# Patient Record
Sex: Female | Born: 1955
Health system: Southern US, Community
[De-identification: ages and names within clinical notes are randomized; demographics above are authoritative.]

## PROBLEM LIST (undated history)

## (undated) DIAGNOSIS — E7800 Pure hypercholesterolemia, unspecified: Secondary | ICD-10-CM

## (undated) DIAGNOSIS — M797 Fibromyalgia: Secondary | ICD-10-CM

## (undated) DIAGNOSIS — I639 Cerebral infarction, unspecified: Secondary | ICD-10-CM

## (undated) DIAGNOSIS — I10 Essential (primary) hypertension: Secondary | ICD-10-CM

## (undated) DIAGNOSIS — C801 Malignant (primary) neoplasm, unspecified: Secondary | ICD-10-CM

## (undated) DIAGNOSIS — J45909 Unspecified asthma, uncomplicated: Secondary | ICD-10-CM

## (undated) HISTORY — PX: TUBAL LIGATION: SHX77

---

## 2009-10-26 ENCOUNTER — Ambulatory Visit (HOSPITAL_COMMUNITY): Admission: RE | Admit: 2009-10-26 | Discharge: 2009-10-26 | Payer: Self-pay | Admitting: Unknown Physician Specialty

## 2010-10-29 ENCOUNTER — Other Ambulatory Visit (HOSPITAL_COMMUNITY): Payer: Self-pay | Admitting: Unknown Physician Specialty

## 2010-10-29 DIAGNOSIS — Z1231 Encounter for screening mammogram for malignant neoplasm of breast: Secondary | ICD-10-CM

## 2010-11-07 ENCOUNTER — Ambulatory Visit (HOSPITAL_COMMUNITY)
Admission: RE | Admit: 2010-11-07 | Discharge: 2010-11-07 | Disposition: A | Discharge status: Home / Self Care | Payer: Self-pay | Source: Ambulatory Visit | Attending: Unknown Physician Specialty | Admitting: Unknown Physician Specialty

## 2010-11-07 DIAGNOSIS — Z1231 Encounter for screening mammogram for malignant neoplasm of breast: Secondary | ICD-10-CM | POA: Insufficient documentation

## 2011-09-27 ENCOUNTER — Other Ambulatory Visit (HOSPITAL_BASED_OUTPATIENT_CLINIC_OR_DEPARTMENT_OTHER): Payer: Self-pay | Admitting: Unknown Physician Specialty

## 2011-09-27 DIAGNOSIS — Z1231 Encounter for screening mammogram for malignant neoplasm of breast: Secondary | ICD-10-CM

## 2011-11-08 ENCOUNTER — Encounter (HOSPITAL_BASED_OUTPATIENT_CLINIC_OR_DEPARTMENT_OTHER): Payer: Self-pay

## 2011-11-08 ENCOUNTER — Emergency Department (HOSPITAL_BASED_OUTPATIENT_CLINIC_OR_DEPARTMENT_OTHER)
Admission: EM | Admit: 2011-11-08 | Discharge: 2011-11-08 | Disposition: A | Discharge status: Home / Self Care | Payer: Self-pay | Attending: Emergency Medicine | Admitting: Emergency Medicine

## 2011-11-08 ENCOUNTER — Ambulatory Visit (HOSPITAL_BASED_OUTPATIENT_CLINIC_OR_DEPARTMENT_OTHER)
Admission: RE | Admit: 2011-11-08 | Discharge: 2011-11-08 | Disposition: A | Discharge status: Home / Self Care | Payer: Self-pay | Source: Ambulatory Visit | Attending: Unknown Physician Specialty | Admitting: Unknown Physician Specialty

## 2011-11-08 DIAGNOSIS — H6980 Other specified disorders of Eustachian tube, unspecified ear: Secondary | ICD-10-CM

## 2011-11-08 DIAGNOSIS — I10 Essential (primary) hypertension: Secondary | ICD-10-CM | POA: Insufficient documentation

## 2011-11-08 DIAGNOSIS — E7800 Pure hypercholesterolemia, unspecified: Secondary | ICD-10-CM | POA: Insufficient documentation

## 2011-11-08 DIAGNOSIS — Z87891 Personal history of nicotine dependence: Secondary | ICD-10-CM | POA: Insufficient documentation

## 2011-11-08 DIAGNOSIS — H9209 Otalgia, unspecified ear: Secondary | ICD-10-CM | POA: Insufficient documentation

## 2011-11-08 DIAGNOSIS — Z1231 Encounter for screening mammogram for malignant neoplasm of breast: Secondary | ICD-10-CM | POA: Insufficient documentation

## 2011-11-08 DIAGNOSIS — E119 Type 2 diabetes mellitus without complications: Secondary | ICD-10-CM | POA: Insufficient documentation

## 2011-11-08 HISTORY — DX: Essential (primary) hypertension: I10

## 2011-11-08 HISTORY — DX: Pure hypercholesterolemia, unspecified: E78.00

## 2011-11-08 HISTORY — DX: Fibromyalgia: M79.7

## 2011-11-08 HISTORY — DX: Unspecified asthma, uncomplicated: J45.909

## 2011-11-08 MED ORDER — GUAIFENESIN ER 1200 MG PO TB12: 1.0000 | ORAL_TABLET | Freq: Two times a day (BID) | ORAL | Status: DC

## 2011-11-08 MED ORDER — BUDESONIDE 32 MCG/ACT NA SUSP: 2.0000 | Freq: Every day | NASAL | Status: DC

## 2011-11-08 NOTE — ED Provider Notes (Signed)
Medical screening examination/treatment/procedure(s) were performed by non-physician practitioner and as supervising physician I was immediately available for consultation/collaboration.   Caytlyn Evers, MD 11/08/11 1820 

## 2011-11-08 NOTE — ED Provider Notes (Signed)
History     CSN: [id number]  Arrival date & time 11/08/11  1228   First MD Initiated Contact with Patient 11/08/11 1242      Chief Complaint  Patient presents with  . Otalgia    (Consider location/radiation/quality/duration/timing/severity/associated sxs/prior treatment) HPI The patient presents with bilateral ear pain for the last 2 weeks. The patient states that she feels like there is fluid in her ears. The patient is hearing popping and crackling in her ears. The patient states she has not tried any treatment for this issue. The patient states that she was not sure if something flew into her ear or what the issue was. The patient denies cough, fever, sore throat, nausea, vomiting, or diarrhea. The patient states that at night she feels like there is a wooshing sound in her ears. Past Medical History  Diagnosis Date  . Hypertension   . Diabetes mellitus   . High cholesterol   . Asthma   . Fibromyalgia     Past Surgical History  Procedure Date  . Tubal ligation     No family history on file.  History  Substance Use Topics  . Smoking status: Former Games developer  . Smokeless tobacco: Not on file  . Alcohol Use: Yes    OB History    Grav Para Term Preterm Abortions TAB SAB Ect Mult Living                  Review of Systems All other systems negative except as documented in the HPI. All pertinent positives and negatives as reviewed in the HPI.  Allergies  Review of patient's allergies indicates no known allergies.  Home Medications  No current outpatient prescriptions on file.  BP 167/80  Pulse 77  Temp 98.6 F (37 C) (Oral)  Resp 20  Ht 5\' 2"  (1.575 m)  Wt 197 lb (89.359 kg)  BMI 36.03 kg/m2  SpO2 100%  LMP 10/30/2011  Physical Exam  Constitutional: She appears well-developed and well-nourished.  HENT:  Head: Normocephalic and atraumatic.  Right Ear: No drainage or tenderness. Tympanic membrane is not erythematous and not bulging. A middle ear effusion  is present.  Left Ear: No drainage or tenderness. Tympanic membrane is not erythematous and not bulging. A middle ear effusion is present.  Eyes: EOM are normal. Pupils are equal, round, and reactive to light.  Neck: Normal range of motion. Neck supple.  Cardiovascular: Normal rate and regular rhythm.   Pulmonary/Chest: Effort normal and breath sounds normal.  Skin: Skin is warm and dry. No rash noted.    ED Course  Procedures (including critical care time)  The patient most likely has eustachian tube dysfunction. She is referred back to her PCP and to ENT as needed. Told to increase her fluids.    MDM         Carlyle Dolly, PA-C 11/08/11 1258

## 2011-11-08 NOTE — Discharge Instructions (Signed)
Return here as needed. Increase your fluids. Follow up with your doctor for a recheck and with the ENT doctor as needed.

## 2011-11-08 NOTE — ED Notes (Signed)
bilat earache x 2 weeks

## 2012-06-28 ENCOUNTER — Emergency Department (HOSPITAL_BASED_OUTPATIENT_CLINIC_OR_DEPARTMENT_OTHER)
Admission: EM | Admit: 2012-06-28 | Discharge: 2012-06-28 | Disposition: A | Discharge status: Home / Self Care | Payer: Self-pay | Attending: Emergency Medicine | Admitting: Emergency Medicine

## 2012-06-28 ENCOUNTER — Encounter (HOSPITAL_BASED_OUTPATIENT_CLINIC_OR_DEPARTMENT_OTHER): Payer: Self-pay | Admitting: *Deleted

## 2012-06-28 DIAGNOSIS — IMO0001 Reserved for inherently not codable concepts without codable children: Secondary | ICD-10-CM | POA: Insufficient documentation

## 2012-06-28 DIAGNOSIS — Z862 Personal history of diseases of the blood and blood-forming organs and certain disorders involving the immune mechanism: Secondary | ICD-10-CM | POA: Insufficient documentation

## 2012-06-28 DIAGNOSIS — M791 Myalgia, unspecified site: Secondary | ICD-10-CM

## 2012-06-28 DIAGNOSIS — R05 Cough: Secondary | ICD-10-CM | POA: Insufficient documentation

## 2012-06-28 DIAGNOSIS — R059 Cough, unspecified: Secondary | ICD-10-CM | POA: Insufficient documentation

## 2012-06-28 DIAGNOSIS — E119 Type 2 diabetes mellitus without complications: Secondary | ICD-10-CM | POA: Insufficient documentation

## 2012-06-28 DIAGNOSIS — Z8639 Personal history of other endocrine, nutritional and metabolic disease: Secondary | ICD-10-CM | POA: Insufficient documentation

## 2012-06-28 DIAGNOSIS — J45901 Unspecified asthma with (acute) exacerbation: Secondary | ICD-10-CM | POA: Insufficient documentation

## 2012-06-28 DIAGNOSIS — J3489 Other specified disorders of nose and nasal sinuses: Secondary | ICD-10-CM | POA: Insufficient documentation

## 2012-06-28 DIAGNOSIS — R51 Headache: Secondary | ICD-10-CM

## 2012-06-28 DIAGNOSIS — Z87891 Personal history of nicotine dependence: Secondary | ICD-10-CM | POA: Insufficient documentation

## 2012-06-28 DIAGNOSIS — R110 Nausea: Secondary | ICD-10-CM | POA: Insufficient documentation

## 2012-06-28 DIAGNOSIS — I10 Essential (primary) hypertension: Secondary | ICD-10-CM | POA: Insufficient documentation

## 2012-06-28 MED ORDER — IBUPROFEN 600 MG PO TABS: 600.0000 mg | ORAL_TABLET | Freq: Four times a day (QID) | ORAL | Status: DC | PRN

## 2012-06-28 MED ORDER — DIPHENHYDRAMINE HCL 25 MG PO CAPS
50.0000 mg | ORAL_CAPSULE | Freq: Once | ORAL | Status: AC
  Administered 2012-06-28: 50 mg via ORAL
  Filled 2012-06-28: qty 2

## 2012-06-28 MED ORDER — KETOROLAC TROMETHAMINE 30 MG/ML IJ SOLN
30.0000 mg | Freq: Once | INTRAMUSCULAR | Status: AC
  Administered 2012-06-28: 30 mg via INTRAMUSCULAR
  Filled 2012-06-28: qty 1

## 2012-06-28 MED ORDER — METOCLOPRAMIDE HCL 5 MG/ML IJ SOLN
10.0000 mg | Freq: Once | INTRAMUSCULAR | Status: AC
  Administered 2012-06-28: 10 mg via INTRAMUSCULAR
  Filled 2012-06-28: qty 2

## 2012-06-28 NOTE — ED Notes (Addendum)
Pt describes H/A, back, chest pain with deep inspiration, productive cough with light yellow sputum x 1 week. Here with family member also being seen.

## 2012-06-28 NOTE — ED Provider Notes (Signed)
History  This chart was scribed for Krystal Chick, MD by Shari Heritage, ED Scribe. The patient was seen in room MH05/MH05. Patient's care was started at 2153.   CSN: [id number]  Arrival date & time 06/28/12  1825   First MD Initiated Contact with Patient 06/28/12 2153      Chief Complaint  Patient presents with  . Headache     Patient is a 57 y.o. female presenting with headaches.  Headache Pain location:  R temporal and frontal Quality:  Dull Radiates to:  Does not radiate Duration:  2 weeks Timing:  Constant Relieved by:  Aspirin (transiently) Associated symptoms: cough, myalgias and nausea   Associated symptoms: no fever   Cough:    Cough characteristics:  Productive   Sputum characteristics:  Clear   Severity:  Mild   Duration:  1 week   Timing:  Intermittent Myalgias:    Location:  Generalized   Severity:  Moderate   Duration:  1 week   HPI Comments: Krystal Vargas is a 57 y.o. female with history of asthma, HTN, diabetes and high cholesterol who presents to the Emergency Department complaining of constant, non-radiating, right temporal and frontal headache onset 2 weeks ago. There is associated body aches, mild intermittent cough, wheezing and nausea. She states that body aches, cough and wheezing began 1 week ago and nausea began earlier today. Cough is productive of clear sputum. She states that she has used her inhaler with no relief. She has been taking aspirin for the headache which she says relieves pain transiently. Patient denies fever. She states that she hasn't checked her CGB in a few days, but she states that glucose is usually well controlled.   Past Medical History  Diagnosis Date  . Hypertension   . Diabetes mellitus   . High cholesterol   . Asthma   . Fibromyalgia     Past Surgical History  Procedure Laterality Date  . Tubal ligation      History reviewed. No pertinent family history.  History  Substance Use Topics  . Smoking status: Former  Games developer  . Smokeless tobacco: Not on file  . Alcohol Use: Yes    OB History   Grav Para Term Preterm Abortions TAB SAB Ect Mult Living                  Review of Systems  Constitutional: Negative for fever.  Respiratory: Positive for cough and wheezing.   Gastrointestinal: Positive for nausea.  Musculoskeletal: Positive for myalgias.  Neurological: Positive for headaches.  All other systems reviewed and are negative.    Allergies  Review of patient's allergies indicates no known allergies.  Home Medications   Current Outpatient Rx  Name  Route  Sig  Dispense  Refill  . budesonide (RHINOCORT AQUA) 32 MCG/ACT nasal spray   Nasal   Place 2 sprays into the nose daily.   8.6 g   0   . Guaifenesin 1200 MG TB12   Oral   Take 1 tablet (1,200 mg total) by mouth 2 (two) times daily.   20 each   0   . ibuprofen (ADVIL,MOTRIN) 600 MG tablet   Oral   Take 1 tablet (600 mg total) by mouth every 6 (six) hours as needed for pain.   30 tablet   0   . UNKNOWN TO PATIENT      Pt does not know meds and did not bring to ED  Triage Vitals: BP 172/86  Pulse 77  Temp(Src) 99 F (37.2 C) (Oral)  Resp 18  Ht 5\' 2"  (1.575 m)  Wt 204 lb (92.534 kg)  BMI 37.3 kg/m2  SpO2 96%  LMP 05/16/2012  Physical Exam  Nursing note and vitals reviewed. Constitutional: She is oriented to person, place, and time. She appears well-developed and well-nourished.  HENT:  Head: Normocephalic and atraumatic.  Mouth/Throat: Oropharynx is clear and moist and mucous membranes are normal. Mucous membranes are not dry.  Eyes: Conjunctivae and EOM are normal. Pupils are equal, round, and reactive to light.  Neck: Normal range of motion. Neck supple.  Cardiovascular: Normal rate, regular rhythm and normal heart sounds.   Pulmonary/Chest: Effort normal and breath sounds normal. No respiratory distress. She has no wheezes. She has no rales.  No increased respiratory effort.  Abdominal: Soft.  Bowel sounds are normal.  Musculoskeletal: Normal range of motion.  Neurological: She is alert and oriented to person, place, and time.  Skin: Skin is warm and dry.  Psychiatric: She has a normal mood and affect.    ED Course  Procedures (including critical care time) DIAGNOSTIC STUDIES: Oxygen Saturation is 96% on room air, adequate by my interpretation.    COORDINATION OF CARE: 10:26 PM- Patient informed of current plan for treatment and evaluation and agrees with plan at this time.    Labs Reviewed  GLUCOSE, CAPILLARY    No results found.   1. Headache   2. Myalgia       MDM  Pt presents with c/o headache, nasal congestion and body aches.  She overall appears well hydrated and nontoxic.  No wheezing or abnormal lung sounds on exam.  Blood glucose is well controlled. She was given meds for headache.  Symptoms sound mostly viral in nature.  Discharged with strict return precautions.  Pt agreeable with plan.    I personally performed the services described in this documentation, which was scribed in my presence. The recorded information has been reviewed and is accurate.    Krystal Chick, MD 07/01/12 1536

## 2012-06-28 NOTE — ED Notes (Signed)
I took cbg reading and got 33 mg/dcltr.

## 2014-01-07 ENCOUNTER — Encounter (HOSPITAL_BASED_OUTPATIENT_CLINIC_OR_DEPARTMENT_OTHER): Payer: Self-pay | Admitting: Emergency Medicine

## 2014-01-07 ENCOUNTER — Emergency Department (HOSPITAL_BASED_OUTPATIENT_CLINIC_OR_DEPARTMENT_OTHER): Payer: Self-pay

## 2014-01-07 ENCOUNTER — Emergency Department (HOSPITAL_BASED_OUTPATIENT_CLINIC_OR_DEPARTMENT_OTHER)
Admission: EM | Admit: 2014-01-07 | Discharge: 2014-01-07 | Disposition: A | Discharge status: Home / Self Care | Payer: Self-pay | Attending: Emergency Medicine | Admitting: Emergency Medicine

## 2014-01-07 DIAGNOSIS — M25476 Effusion, unspecified foot: Principal | ICD-10-CM | POA: Insufficient documentation

## 2014-01-07 DIAGNOSIS — M25472 Effusion, left ankle: Secondary | ICD-10-CM

## 2014-01-07 DIAGNOSIS — Z87891 Personal history of nicotine dependence: Secondary | ICD-10-CM | POA: Insufficient documentation

## 2014-01-07 DIAGNOSIS — E7800 Pure hypercholesterolemia, unspecified: Secondary | ICD-10-CM | POA: Insufficient documentation

## 2014-01-07 DIAGNOSIS — E119 Type 2 diabetes mellitus without complications: Secondary | ICD-10-CM | POA: Insufficient documentation

## 2014-01-07 DIAGNOSIS — M25579 Pain in unspecified ankle and joints of unspecified foot: Secondary | ICD-10-CM | POA: Insufficient documentation

## 2014-01-07 DIAGNOSIS — I10 Essential (primary) hypertension: Secondary | ICD-10-CM | POA: Insufficient documentation

## 2014-01-07 DIAGNOSIS — M25473 Effusion, unspecified ankle: Secondary | ICD-10-CM | POA: Insufficient documentation

## 2014-01-07 DIAGNOSIS — J45909 Unspecified asthma, uncomplicated: Secondary | ICD-10-CM | POA: Insufficient documentation

## 2014-01-07 DIAGNOSIS — IMO0002 Reserved for concepts with insufficient information to code with codable children: Secondary | ICD-10-CM | POA: Insufficient documentation

## 2014-01-07 MED ORDER — HYDROCODONE-ACETAMINOPHEN 5-325 MG PO TABS: 2.0000 | ORAL_TABLET | ORAL | Status: DC | PRN

## 2014-01-07 MED ORDER — IBUPROFEN 800 MG PO TABS: 800.0000 mg | ORAL_TABLET | Freq: Three times a day (TID) | ORAL | Status: DC

## 2014-01-07 NOTE — Discharge Instructions (Signed)

## 2014-01-07 NOTE — ED Provider Notes (Signed)
Medical screening examination/treatment/procedure(s) were performed by non-physician practitioner and as supervising physician I was immediately available for consultation/collaboration.   EKG Interpretation None        Evelina Bucy, MD 01/07/14 2250

## 2014-01-07 NOTE — ED Provider Notes (Signed)
CSN: [id number]     Arrival date & time 01/07/14  1425 History   First MD Initiated Contact with Patient 01/07/14 1547     Chief Complaint  Patient presents with  . Ankle Pain     (Consider location/radiation/quality/duration/timing/severity/associated sxs/prior Treatment) Patient is a 58 y.o. female presenting with ankle pain. The history is provided by the patient. No language interpreter was used.  Ankle Pain Location:  Ankle Injury: no   Ankle location:  L ankle Pain details:    Quality:  Aching   Radiates to:  Does not radiate   Severity:  Moderate   Onset quality:  Gradual   Timing:  Constant   Progression:  Worsening Chronicity:  New Dislocation: no   Foreign body present:  No foreign bodies Tetanus status:  Out of date Prior injury to area:  Yes Relieved by:  Nothing Worsened by:  Nothing tried Ineffective treatments:  None tried Associated symptoms: swelling   Risk factors: no concern for non-accidental trauma     Past Medical History  Diagnosis Date  . Hypertension   . Diabetes mellitus   . High cholesterol   . Asthma   . Fibromyalgia    Past Surgical History  Procedure Laterality Date  . Tubal ligation     No family history on file. History  Substance Use Topics  . Smoking status: Former Research scientist (life sciences)  . Smokeless tobacco: Not on file  . Alcohol Use: Yes   OB History   Grav Para Term Preterm Abortions TAB SAB Ect Mult Living                 Review of Systems  Musculoskeletal: Positive for joint swelling.  All other systems reviewed and are negative.     Allergies  Review of patient's allergies indicates no known allergies.  Home Medications   Prior to Admission medications   Medication Sig Start Date End Date Taking? Authorizing Provider  HYDROCHLOROTHIAZIDE PO Take by mouth.   Yes Historical Provider, MD  Rosuvastatin Calcium (CRESTOR PO) Take by mouth.   Yes Historical Provider, MD  budesonide (RHINOCORT AQUA) 32 MCG/ACT nasal spray  Place 2 sprays into the nose daily. 11/08/11 11/07/12  Resa Miner Lawyer, PA-C  Guaifenesin 1200 MG TB12 Take 1 tablet (1,200 mg total) by mouth 2 (two) times daily. 11/08/11   Resa Miner Lawyer, PA-C  ibuprofen (ADVIL,MOTRIN) 600 MG tablet Take 1 tablet (600 mg total) by mouth every 6 (six) hours as needed for pain. 06/28/12   Threasa Beards, MD  UNKNOWN TO PATIENT Pt does not know meds and did not bring to ED    Historical Provider, MD   BP 155/85  Pulse 63  Temp(Src) 98.3 F (36.8 C) (Oral)  Resp 16  Ht 5\' 1"  (1.549 m)  Wt 204 lb (92.534 kg)  BMI 38.57 kg/m2  SpO2 99%  LMP 05/16/2012 Physical Exam  Nursing note and vitals reviewed. Constitutional: She appears well-developed and well-nourished.  Musculoskeletal: She exhibits tenderness.  Swollen ankle, from,  nv and ns intact   Neurological: She is alert. She has normal reflexes.  Skin: Skin is warm.  Psychiatric: She has a normal mood and affect.    ED Course  Procedures (including critical care time) Labs Review Labs Reviewed - No data to display  Imaging Review Dg Ankle Complete Left  01/07/2014   CLINICAL DATA:  Ankle pain .  EXAM: LEFT ANKLE COMPLETE - 3+ VIEW  COMPARISON:  None.  FINDINGS: Soft tissue swelling  is noted. No evidence of fracture or dislocation. No acute bony abnormality .  IMPRESSION: Soft tissue swelling.  No acute bony abnormality .   Electronically Signed   By: Marcello Moores  Register   On: 01/07/2014 14:58     EKG Interpretation None      MDM   Final diagnoses:  Ankle swelling, left    Post op shoe Ace Hydrocodone Ibuprofen    Fransico Meadow, PA-C 01/07/14 1658

## 2014-01-07 NOTE — ED Notes (Signed)
Pain in her left ankle x 2 weeks. No known injury.

## 2014-04-19 ENCOUNTER — Emergency Department (HOSPITAL_BASED_OUTPATIENT_CLINIC_OR_DEPARTMENT_OTHER)
Admission: EM | Admit: 2014-04-19 | Discharge: 2014-04-19 | Disposition: A | Discharge status: Home / Self Care | Payer: Self-pay | Attending: Emergency Medicine | Admitting: Emergency Medicine

## 2014-04-19 ENCOUNTER — Encounter (HOSPITAL_BASED_OUTPATIENT_CLINIC_OR_DEPARTMENT_OTHER): Payer: Self-pay

## 2014-04-19 DIAGNOSIS — M797 Fibromyalgia: Secondary | ICD-10-CM | POA: Insufficient documentation

## 2014-04-19 DIAGNOSIS — Z791 Long term (current) use of non-steroidal anti-inflammatories (NSAID): Secondary | ICD-10-CM | POA: Insufficient documentation

## 2014-04-19 DIAGNOSIS — M25511 Pain in right shoulder: Secondary | ICD-10-CM | POA: Insufficient documentation

## 2014-04-19 DIAGNOSIS — E780 Pure hypercholesterolemia: Secondary | ICD-10-CM | POA: Insufficient documentation

## 2014-04-19 DIAGNOSIS — M25531 Pain in right wrist: Secondary | ICD-10-CM | POA: Insufficient documentation

## 2014-04-19 DIAGNOSIS — Z79899 Other long term (current) drug therapy: Secondary | ICD-10-CM | POA: Insufficient documentation

## 2014-04-19 DIAGNOSIS — J45909 Unspecified asthma, uncomplicated: Secondary | ICD-10-CM | POA: Insufficient documentation

## 2014-04-19 DIAGNOSIS — M2550 Pain in unspecified joint: Secondary | ICD-10-CM

## 2014-04-19 DIAGNOSIS — M25512 Pain in left shoulder: Secondary | ICD-10-CM | POA: Insufficient documentation

## 2014-04-19 DIAGNOSIS — Z87891 Personal history of nicotine dependence: Secondary | ICD-10-CM | POA: Insufficient documentation

## 2014-04-19 DIAGNOSIS — M25532 Pain in left wrist: Secondary | ICD-10-CM | POA: Insufficient documentation

## 2014-04-19 DIAGNOSIS — E119 Type 2 diabetes mellitus without complications: Secondary | ICD-10-CM | POA: Insufficient documentation

## 2014-04-19 DIAGNOSIS — M25571 Pain in right ankle and joints of right foot: Secondary | ICD-10-CM | POA: Insufficient documentation

## 2014-04-19 DIAGNOSIS — M25561 Pain in right knee: Secondary | ICD-10-CM | POA: Insufficient documentation

## 2014-04-19 DIAGNOSIS — M25572 Pain in left ankle and joints of left foot: Secondary | ICD-10-CM | POA: Insufficient documentation

## 2014-04-19 DIAGNOSIS — I10 Essential (primary) hypertension: Secondary | ICD-10-CM | POA: Insufficient documentation

## 2014-04-19 DIAGNOSIS — M25562 Pain in left knee: Secondary | ICD-10-CM | POA: Insufficient documentation

## 2014-04-19 MED ORDER — TRAMADOL HCL 50 MG PO TABS: 50.0000 mg | ORAL_TABLET | Freq: Four times a day (QID) | ORAL | Status: DC | PRN

## 2014-04-19 NOTE — ED Notes (Signed)
Pt reports pain all over. Sts pain worse on left. Sts "they told me they couldn't do anything about it." Sts "I have fibromyalgia and arthritis."

## 2014-04-19 NOTE — Discharge Instructions (Signed)
Take pain medication as needed for pain.  Do not drive or operate heavy machinery for 4-6 hours after taking pain medication. Arthralgia Your caregiver has diagnosed you as suffering from an arthralgia. Arthralgia means there is pain in a joint. This can come from many reasons including:  Bruising the joint which causes soreness (inflammation) in the joint.  Wear and tear on the joints which occur as we grow older (osteoarthritis).  Overusing the joint.  Various forms of arthritis.  Infections of the joint. Regardless of the cause of pain in your joint, most of these different pains respond to anti-inflammatory drugs and rest. The exception to this is when a joint is infected, and these cases are treated with antibiotics, if it is a bacterial infection. HOME CARE INSTRUCTIONS   Rest the injured area for as long as directed by your caregiver. Then slowly start using the joint as directed by your caregiver and as the pain allows. Crutches as directed may be useful if the ankles, knees or hips are involved. If the knee was splinted or casted, continue use and care as directed. If an stretchy or elastic wrapping bandage has been applied today, it should be removed and re-applied every 3 to 4 hours. It should not be applied tightly, but firmly enough to keep swelling down. Watch toes and feet for swelling, bluish discoloration, coldness, numbness or excessive pain. If any of these problems (symptoms) occur, remove the ace bandage and re-apply more loosely. If these symptoms persist, contact your caregiver or return to this location.  For the first 24 hours, keep the injured extremity elevated on pillows while lying down.  Apply ice for 15-20 minutes to the sore joint every couple hours while awake for the first half day. Then 03-04 times per day for the first 48 hours. Put the ice in a plastic bag and place a towel between the bag of ice and your skin.  Wear any splinting, casting, elastic bandage  applications, or slings as instructed.  Only take over-the-counter or prescription medicines for pain, discomfort, or fever as directed by your caregiver. Do not use aspirin immediately after the injury unless instructed by your physician. Aspirin can cause increased bleeding and bruising of the tissues.  If you were given crutches, continue to use them as instructed and do not resume weight bearing on the sore joint until instructed. Persistent pain and inability to use the sore joint as directed for more than 2 to 3 days are warning signs indicating that you should see a caregiver for a follow-up visit as soon as possible. Initially, a hairline fracture (break in bone) may not be evident on X-rays. Persistent pain and swelling indicate that further evaluation, non-weight bearing or use of the joint (use of crutches or slings as instructed), or further X-rays are indicated. X-rays may sometimes not show a small fracture until a week or 10 days later. Make a follow-up appointment with your own caregiver or one to whom we have referred you. A radiologist (specialist in reading X-rays) may read your X-rays. Make sure you know how you are to obtain your X-ray results. Do not assume everything is normal if you do not hear from [place]. SEEK MEDICAL CARE IF: Bruising, swelling, or pain increases. SEEK IMMEDIATE MEDICAL CARE IF:   Your fingers or toes are numb or blue.  The pain is not responding to medications and continues to stay the same or get worse.  The pain in your joint becomes severe.  You  develop a fever over 102 F (38.9 C).  It becomes impossible to move or use the joint. MAKE SURE YOU:   Understand these instructions.  Will watch your condition.  Will get help right away if you are not doing well or get worse. Document Released: 04/29/2005 Document Revised: 07/22/2011 Document Reviewed: 12/16/2007 [facility] Patient Information 2015 [place], [place]. This information is not intended to  replace advice given to you by your health care provider. Make sure you discuss any questions you have with your health care provider.

## 2014-04-19 NOTE — ED Provider Notes (Signed)
CSN: [id number]     Arrival date & time 04/19/14  1803 History  This chart was scribed for Hyman Bible, PA-C with Slutsky Morn, MD by Edison Simon, ED Scribe. This patient was seen in room MH10/MH10 and the patient's care was started at 6:51 PM.    Chief Complaint  Patient presents with  . Generalized Body Aches   The history is provided by the patient. No language interpreter was used.   HPI Comments: Krystal Vargas is a 58 y.o. female with history of arthritis and fibromyalgia who presents to the Emergency Department complaining of "pain all over" since 11/17. She states it occurs every day and is worse at night. She locates it to her bilateral hands shooting to her shoulder, and feet,  and knees when walking, worse on the left. She reports some associated pain of her wrists and ankles.  She denies fever or chills. She denies acute injury or trauma.  She states she was evaluated by her PCP who tested her for rheumatoid arthritis, but has not gotten results back yet. She states she has not had a fibromyalgia flare up in some time and is unsure if this pain is similar but states it might be. She was prescribed Carbamazepine for fibromyalgia previously which she used to use every day; she states she can get more filled from prior prescription. She states she has been using ASA for her symptoms without relief.  Past Medical History  Diagnosis Date  . Hypertension   . Diabetes mellitus   . High cholesterol   . Asthma   . Fibromyalgia    Past Surgical History  Procedure Laterality Date  . Tubal ligation     No family history on file. History  Substance Use Topics  . Smoking status: Former Research scientist (life sciences)  . Smokeless tobacco: Not on file  . Alcohol Use: Yes   OB History    No data available     Review of Systems  Constitutional: Negative for fever.  Musculoskeletal: Positive for myalgias and arthralgias.  All other systems reviewed and are negative.     Allergies  Review of patient's  allergies indicates no known allergies.  Home Medications   Prior to Admission medications   Medication Sig Start Date End Date Taking? Authorizing Provider  budesonide (RHINOCORT AQUA) 32 MCG/ACT nasal spray Place 2 sprays into the nose daily. 11/08/11 11/07/12  Resa Miner Lawyer, PA-C  Guaifenesin 1200 MG TB12 Take 1 tablet (1,200 mg total) by mouth 2 (two) times daily. 11/08/11   Resa Miner Lawyer, PA-C  HYDROCHLOROTHIAZIDE PO Take by mouth.    Historical Provider, MD  HYDROcodone-acetaminophen (NORCO/VICODIN) 5-325 MG per tablet Take 2 tablets by mouth every 4 (four) hours as needed. 01/07/14   Fransico Meadow, PA-C  ibuprofen (ADVIL,MOTRIN) 600 MG tablet Take 1 tablet (600 mg total) by mouth every 6 (six) hours as needed for pain. 06/28/12   Threasa Beards, MD  ibuprofen (ADVIL,MOTRIN) 800 MG tablet Take 1 tablet (800 mg total) by mouth 3 (three) times daily. 01/07/14   Fransico Meadow, PA-C  Rosuvastatin Calcium (CRESTOR PO) Take by mouth.    Historical Provider, MD  UNKNOWN TO PATIENT Pt does not know meds and did not bring to ED    Historical Provider, MD   BP 158/83 mmHg  Pulse 84  Temp(Src) 99.2 F (37.3 C) (Oral)  Resp 18  SpO2 96%  LMP 05/16/2012 Physical Exam  Constitutional: She is oriented to person, place, and time.  She appears well-developed and well-nourished.  HENT:  Head: Normocephalic and atraumatic.  Eyes: Conjunctivae are normal.  Neck: Normal range of motion. Neck supple.  Cardiovascular: Normal rate, regular rhythm and normal heart sounds.   No murmur heard. Pulses:      Radial pulses are 2+ on the right side, and 2+ on the left side.       Dorsalis pedis pulses are 2+ on the right side, and 2+ on the left side.  Pulmonary/Chest: Effort normal and breath sounds normal. No respiratory distress. She has no wheezes. She has no rales.  Musculoskeletal: Normal range of motion. She exhibits no edema or tenderness.  Full ROM of ankles, knees, and hips bilaterally.  No erythema, edema, or warmth Full ROM of wrist, ankles, shoulder, and hands bilaterally. no erythema, edema, or warmth  Neurological: She is alert and oriented to person, place, and time.  Skin: Skin is warm and dry.  Psychiatric: She has a normal mood and affect.  Nursing note and vitals reviewed.   ED Course  Procedures (including critical care time)  DIAGNOSTIC STUDIES: Oxygen Saturation is 96% on room air, normal by my interpretation.    COORDINATION OF CARE: 6:59 PM Discussed with patient that since she has had workup from her PCP and she does not have any signs of infection or new acute problems, further workup here is unlikely to be helpful; will treat with pain medication. The patient agrees with the plan and has no further questions at this time.   Labs Review Labs Reviewed - No data to display  Imaging Review No results found.   EKG Interpretation None      MDM   Final diagnoses:  None   Patient with a history of Fibromyalgia presents today with diffuse joint pain.  Patient afebrile.  No signs of infection at this time.  Full ROM of all joints.  She reports that the pain is similar to pain that she has had in the past with a Fibromyalgia flare up.  Patient stable for discharge.  Patient discharged home with pain medication.  Return precautions given.   Hyman Bible, PA-C 04/19/14 [place], MD 04/19/14 1933

## 2014-04-19 NOTE — ED Notes (Signed)
Pt states she started having increased joint pain, shoulders, knees, hands, feet and pelvis since returning home from a trip to [facility] Nov 17th.  Denies injury.  Pt states she has seen her pcp for same since, and was tested for RA.  Pt states she doesn't know the results as of yet.

## 2014-08-17 ENCOUNTER — Encounter (HOSPITAL_BASED_OUTPATIENT_CLINIC_OR_DEPARTMENT_OTHER): Payer: Self-pay | Admitting: Emergency Medicine

## 2014-08-17 ENCOUNTER — Emergency Department (HOSPITAL_BASED_OUTPATIENT_CLINIC_OR_DEPARTMENT_OTHER)
Admission: EM | Admit: 2014-08-17 | Discharge: 2014-08-17 | Disposition: A | Discharge status: Home / Self Care | Payer: Medicare Other | Attending: Emergency Medicine | Admitting: Emergency Medicine

## 2014-08-17 ENCOUNTER — Emergency Department (HOSPITAL_BASED_OUTPATIENT_CLINIC_OR_DEPARTMENT_OTHER): Payer: Medicare Other

## 2014-08-17 DIAGNOSIS — E876 Hypokalemia: Secondary | ICD-10-CM | POA: Insufficient documentation

## 2014-08-17 DIAGNOSIS — R110 Nausea: Secondary | ICD-10-CM | POA: Diagnosis not present

## 2014-08-17 DIAGNOSIS — Z7982 Long term (current) use of aspirin: Secondary | ICD-10-CM | POA: Diagnosis not present

## 2014-08-17 DIAGNOSIS — J029 Acute pharyngitis, unspecified: Secondary | ICD-10-CM | POA: Insufficient documentation

## 2014-08-17 DIAGNOSIS — E780 Pure hypercholesterolemia: Secondary | ICD-10-CM | POA: Insufficient documentation

## 2014-08-17 DIAGNOSIS — R05 Cough: Secondary | ICD-10-CM | POA: Diagnosis not present

## 2014-08-17 DIAGNOSIS — R509 Fever, unspecified: Secondary | ICD-10-CM | POA: Insufficient documentation

## 2014-08-17 DIAGNOSIS — R5383 Other fatigue: Secondary | ICD-10-CM | POA: Diagnosis not present

## 2014-08-17 DIAGNOSIS — R0981 Nasal congestion: Secondary | ICD-10-CM | POA: Insufficient documentation

## 2014-08-17 DIAGNOSIS — M797 Fibromyalgia: Secondary | ICD-10-CM | POA: Diagnosis not present

## 2014-08-17 DIAGNOSIS — Z87891 Personal history of nicotine dependence: Secondary | ICD-10-CM | POA: Insufficient documentation

## 2014-08-17 DIAGNOSIS — E119 Type 2 diabetes mellitus without complications: Secondary | ICD-10-CM | POA: Insufficient documentation

## 2014-08-17 DIAGNOSIS — R6889 Other general symptoms and signs: Secondary | ICD-10-CM

## 2014-08-17 DIAGNOSIS — Z79899 Other long term (current) drug therapy: Secondary | ICD-10-CM | POA: Insufficient documentation

## 2014-08-17 DIAGNOSIS — I10 Essential (primary) hypertension: Secondary | ICD-10-CM | POA: Insufficient documentation

## 2014-08-17 DIAGNOSIS — H9203 Otalgia, bilateral: Secondary | ICD-10-CM | POA: Insufficient documentation

## 2014-08-17 DIAGNOSIS — R059 Cough, unspecified: Secondary | ICD-10-CM

## 2014-08-17 DIAGNOSIS — J3489 Other specified disorders of nose and nasal sinuses: Secondary | ICD-10-CM | POA: Insufficient documentation

## 2014-08-17 DIAGNOSIS — R51 Headache: Secondary | ICD-10-CM | POA: Insufficient documentation

## 2014-08-17 LAB — CBC WITH DIFFERENTIAL/PLATELET
BASOS ABS: 0 10*3/uL (ref 0.0–0.1)
Basophils Relative: 0 % (ref 0–1)
Eosinophils Absolute: 0.1 10*3/uL (ref 0.0–0.7)
Eosinophils Relative: 1 % (ref 0–5)
HCT: 37 % (ref 36.0–46.0)
Hemoglobin: 11.8 g/dL — ABNORMAL LOW (ref 12.0–15.0)
LYMPHS ABS: 1.5 10*3/uL (ref 0.7–4.0)
Lymphocytes Relative: 24 % (ref 12–46)
MCH: 30 pg (ref 26.0–34.0)
MCHC: 31.9 g/dL (ref 30.0–36.0)
MCV: 94.1 fL (ref 78.0–100.0)
MONO ABS: 0.7 10*3/uL (ref 0.1–1.0)
MONOS PCT: 11 % (ref 3–12)
Neutro Abs: 4.1 10*3/uL (ref 1.7–7.7)
Neutrophils Relative %: 64 % (ref 43–77)
PLATELETS: 253 10*3/uL (ref 150–400)
RBC: 3.93 MIL/uL (ref 3.87–5.11)
RDW: 13.2 % (ref 11.5–15.5)
WBC: 6.4 10*3/uL (ref 4.0–10.5)

## 2014-08-17 LAB — BASIC METABOLIC PANEL
ANION GAP: 9 (ref 5–15)
BUN: 15 mg/dL (ref 6–23)
CALCIUM: 8.7 mg/dL (ref 8.4–10.5)
CO2: 29 mmol/L (ref 19–32)
CREATININE: 0.81 mg/dL (ref 0.50–1.10)
Chloride: 100 mmol/L (ref 96–112)
GFR calc Af Amer: 90 mL/min — ABNORMAL LOW (ref >90)
GFR, EST NON AFRICAN AMERICAN: 77 mL/min — AB (ref >90)
Glucose, Bld: 246 mg/dL — ABNORMAL HIGH (ref 70–99)
Potassium: 2.9 mmol/L — ABNORMAL LOW (ref 3.5–5.1)
SODIUM: 138 mmol/L (ref 135–145)

## 2014-08-17 MED ORDER — ACETAMINOPHEN 325 MG PO TABS
650.0000 mg | ORAL_TABLET | Freq: Once | ORAL | Status: AC
  Administered 2014-08-17: 650 mg via ORAL
  Filled 2014-08-17: qty 2

## 2014-08-17 MED ORDER — POTASSIUM CHLORIDE CRYS ER 20 MEQ PO TBCR
40.0000 meq | EXTENDED_RELEASE_TABLET | Freq: Once | ORAL | Status: AC
  Administered 2014-08-17: 40 meq via ORAL
  Filled 2014-08-17: qty 2

## 2014-08-17 MED ORDER — ALBUTEROL SULFATE HFA 108 (90 BASE) MCG/ACT IN AERS
2.0000 | INHALATION_SPRAY | Freq: Four times a day (QID) | RESPIRATORY_TRACT | Status: DC
  Administered 2014-08-17: 2 via RESPIRATORY_TRACT
  Filled 2014-08-17: qty 6.7

## 2014-08-17 MED ORDER — IPRATROPIUM-ALBUTEROL 0.5-2.5 (3) MG/3ML IN SOLN
3.0000 mL | RESPIRATORY_TRACT | Status: DC
  Administered 2014-08-17: 3 mL via RESPIRATORY_TRACT
  Filled 2014-08-17: qty 3

## 2014-08-17 MED ORDER — SODIUM CHLORIDE 0.9 % IV SOLN: INTRAVENOUS | Status: DC

## 2014-08-17 MED ORDER — ONDANSETRON HCL 4 MG/2ML IJ SOLN
4.0000 mg | Freq: Once | INTRAMUSCULAR | Status: AC
  Administered 2014-08-17: 4 mg via INTRAVENOUS
  Filled 2014-08-17: qty 2

## 2014-08-17 MED ORDER — DM-GUAIFENESIN ER 30-600 MG PO TB12: 1.0000 | ORAL_TABLET | Freq: Two times a day (BID) | ORAL | Status: DC

## 2014-08-17 MED ORDER — SODIUM CHLORIDE 0.9 % IV BOLUS (SEPSIS)
500.0000 mL | Freq: Once | INTRAVENOUS | Status: AC
  Administered 2014-08-17: 500 mL via INTRAVENOUS

## 2014-08-17 NOTE — ED Notes (Signed)
Pt states she is having headache, runny nose, irritation in eyes, nose and ears, sore throat.  Occasional productive cough.  Some difficulty with her breathing, has history of asthma.

## 2014-08-17 NOTE — ED Provider Notes (Signed)
CSN: [id number]     Arrival date & time 08/17/14  0750 History   First MD Initiated Contact with Patient 08/17/14 0809     Chief Complaint  Patient presents with  . URI     (Consider location/radiation/quality/duration/timing/severity/associated sxs/prior Treatment) Patient is a 59 y.o. female presenting with URI. The history is provided by the patient.  URI Presenting symptoms: congestion, ear pain, fatigue, fever and sore throat   Associated symptoms: headaches and wheezing    patient with onset of mild headache or any nose cough congestion sinus pressure mild sore throat and bilateral ear pain feeling somewhat short of breath felt like was wheezing at times. States has a history of asthma. Patient also history of hypertension and diabetes. Onset of symptoms were on Sunday. No vomiting or diarrhea but does have nausea.  Past Medical History  Diagnosis Date  . Hypertension   . Diabetes mellitus   . High cholesterol   . Asthma   . Fibromyalgia    Past Surgical History  Procedure Laterality Date  . Tubal ligation     No family history on file. History  Substance Use Topics  . Smoking status: Former Research scientist (life sciences)  . Smokeless tobacco: Not on file  . Alcohol Use: Yes   OB History    No data available     Review of Systems  Constitutional: Positive for fever, chills and fatigue.  HENT: Positive for congestion, ear pain, sinus pressure and sore throat.   Eyes: Negative for redness.  Respiratory: Positive for shortness of breath and wheezing.   Cardiovascular: Negative for chest pain.  Gastrointestinal: Positive for nausea. Negative for vomiting, abdominal pain and diarrhea.  Genitourinary: Negative for dysuria.  Skin: Negative for rash.  Neurological: Positive for headaches.  Hematological: Does not bruise/bleed easily.  Psychiatric/Behavioral: Negative for confusion.      Allergies  Review of patient's allergies indicates no known allergies.  Home Medications   Prior  to Admission medications   Medication Sig Start Date End Date Taking? Authorizing Provider  aspirin EC 81 MG tablet Take 81 mg by mouth daily.   Yes Historical Provider, MD  carbamazepine (TEGRETOL XR) 200 MG 12 hr tablet Take 100 mg by mouth 2 (two) times daily.   Yes Historical Provider, MD  glipiZIDE (GLUCOTROL XL) 5 MG 24 hr tablet Take 5 mg by mouth daily with breakfast.   Yes Historical Provider, MD  HYDROCHLOROTHIAZIDE PO Take 25 mg by mouth every morning.    Yes Historical Provider, MD  loratadine (CLARITIN) 10 MG tablet Take 10 mg by mouth daily.   Yes Historical Provider, MD  losartan (COZAAR) 100 MG tablet Take 100 mg by mouth daily.   Yes Historical Provider, MD  metFORMIN (GLUMETZA) 500 MG (MOD) 24 hr tablet Take 500 mg by mouth daily with breakfast.   Yes Historical Provider, MD  Multiple Vitamins-Minerals (MULTIVITAMIN GUMMIES WOMENS PO) Take 1 capsule by mouth daily.   Yes Historical Provider, MD  NIFEdipine (PROCARDIA-XL/ADALAT CC) 60 MG 24 hr tablet Take 60 mg by mouth daily.   Yes Historical Provider, MD  Omega-3 Fatty Acids (EQL FISH OIL) 1200 MG CAPS Take 1 capsule by mouth daily.   Yes Historical Provider, MD  Rosuvastatin Calcium (CRESTOR PO) Take 5 mg by mouth every morning.    Yes Historical Provider, MD  traMADol (ULTRAM) 50 MG tablet Take 50 mg by mouth every 6 (six) hours as needed.   Yes Historical Provider, MD  dextromethorphan-guaiFENesin (P[place]dras DM) 30-600 MG per  12 hr tablet Take 1 tablet by mouth 2 (two) times daily. 08/17/14   Fredia Sorrow, MD   BP 141/68 mmHg  Pulse 70  Temp(Src) 99.1 F (37.3 C)  Resp 16  Ht 5\' 2"  (1.575 m)  Wt 205 lb (92.987 kg)  BMI 37.49 kg/m2  SpO2 95%  LMP 05/16/2012 Physical Exam  Constitutional: She is oriented to person, place, and time. She appears well-developed and well-nourished. No distress.  HENT:  Head: Normocephalic and atraumatic.  Right Ear: External ear normal.  Left Ear: External ear normal.  Mouth/Throat:  Oropharynx is clear and moist. No oropharyngeal exudate.  Eyes: Conjunctivae and EOM are normal. Pupils are equal, round, and reactive to light.  Neck: Normal range of motion.  Cardiovascular: Normal rate and regular rhythm.   No murmur heard. Pulmonary/Chest: Effort normal and breath sounds normal. No respiratory distress. She has no wheezes.  Abdominal: Soft. Bowel sounds are normal. There is no tenderness.  Musculoskeletal: Normal range of motion.  Neurological: She is alert and oriented to person, place, and time. No cranial nerve deficit. She exhibits normal muscle tone. Coordination normal.  Skin: Skin is warm. No rash noted.  Nursing note and vitals reviewed.   ED Course  Procedures (including critical care time) Labs Review Labs Reviewed  CBC WITH DIFFERENTIAL/PLATELET - Abnormal; Notable for the following:    Hemoglobin 11.8 (*)    All other components within normal limits  BASIC METABOLIC PANEL - Abnormal; Notable for the following:    Potassium 2.9 (*)    Glucose, Bld 246 (*)    GFR calc non Af Amer 77 (*)    GFR calc Af Amer 90 (*)    All other components within normal limits   Results for orders placed or performed during the hospital encounter of 08/17/14  CBC with Differential/Platelet  Result Value Ref Range   WBC 6.4 4.0 - 10.5 K/uL   RBC 3.93 3.87 - 5.11 MIL/uL   Hemoglobin 11.8 (L) 12.0 - 15.0 g/dL   HCT 37.0 36.0 - 46.0 %   MCV 94.1 78.0 - 100.0 fL   MCH 30.0 26.0 - 34.0 pg   MCHC 31.9 30.0 - 36.0 g/dL   RDW 13.2 11.5 - 15.5 %   Platelets 253 150 - 400 K/uL   Neutrophils Relative % 64 43 - 77 %   Neutro Abs 4.1 1.7 - 7.7 K/uL   Lymphocytes Relative 24 12 - 46 %   Lymphs Abs 1.5 0.7 - 4.0 K/uL   Monocytes Relative 11 3 - 12 %   Monocytes Absolute 0.7 0.1 - 1.0 K/uL   Eosinophils Relative 1 0 - 5 %   Eosinophils Absolute 0.1 0.0 - 0.7 K/uL   Basophils Relative 0 0 - 1 %   Basophils Absolute 0.0 0.0 - 0.1 K/uL  Basic metabolic panel  Result Value Ref  Range   Sodium 138 135 - 145 mmol/L   Potassium 2.9 (L) 3.5 - 5.1 mmol/L   Chloride 100 96 - 112 mmol/L   CO2 29 19 - 32 mmol/L   Glucose, Bld 246 (H) 70 - 99 mg/dL   BUN 15 6 - 23 mg/dL   Creatinine, Ser 0.81 0.50 - 1.10 mg/dL   Calcium 8.7 8.4 - 10.5 mg/dL   GFR calc non Af Amer 77 (L) >90 mL/min   GFR calc Af Amer 90 (L) >90 mL/min   Anion gap 9 5 - 15     Imaging Review Dg Chest 2  View  08/17/2014   CLINICAL DATA:  Cough, congestion, chest pains. Past medical history high cholesterol, diabetes  EXAM: CHEST  2 VIEW  COMPARISON:  None.  FINDINGS: The heart size and mediastinal contours are within normal limits. Both lungs are clear. The visualized skeletal structures are unremarkable.  IMPRESSION: No active cardiopulmonary disease.   Electronically Signed   By: Kathreen Devoid   On: 08/17/2014 08:54     EKG Interpretation None      MDM   Final diagnoses:  Cough  Flu-like symptoms  Hypokalemia    Symptoms seem to be consistent with a flulike illness. Chest x-ray negative for pneumonia. No leukocytosis. No significant blood sugar abnormalities. Blood sugar is elevated but no evidence of acidosis. Patient does have a history of diabetes. Patient received some fluid hydration here also received albuterol Atrovent nebulizer with improvement of breathing. Never heard any wheezing the patient does feel better after the treatment. Will be continued on albuterol inhaler for the next 7 days. Also treated with Mucinex DM. Patient's ears and throat without any significant findings. Patient nontoxic no acute distress.    Fredia Sorrow, MD 08/17/14 1005

## 2014-08-17 NOTE — Discharge Instructions (Signed)
Potassium was low. Contact your regular Dr. for recheck of the potassium. Given a potassium supplement here today. Take Mucinex DM as directed for the congestion and cough. Using albuterol inhaler 2 puffs every 6 hours for the next 7 days. This helped clear lungs and suppress the cough. Take Tylenol as needed. Symptoms seem to be related to  a flulike illness. Return for any new or worse symptoms.

## 2014-08-17 NOTE — ED Notes (Signed)
Patient preparing for discharge. 

## 2016-01-10 ENCOUNTER — Encounter (HOSPITAL_BASED_OUTPATIENT_CLINIC_OR_DEPARTMENT_OTHER): Payer: Self-pay | Admitting: Emergency Medicine

## 2016-01-10 ENCOUNTER — Emergency Department (HOSPITAL_BASED_OUTPATIENT_CLINIC_OR_DEPARTMENT_OTHER): Payer: Medicare HMO

## 2016-01-10 ENCOUNTER — Emergency Department (HOSPITAL_BASED_OUTPATIENT_CLINIC_OR_DEPARTMENT_OTHER)
Admission: EM | Admit: 2016-01-10 | Discharge: 2016-01-10 | Disposition: A | Discharge status: Home / Self Care | Payer: Medicare HMO | Attending: Emergency Medicine | Admitting: Emergency Medicine

## 2016-01-10 DIAGNOSIS — E119 Type 2 diabetes mellitus without complications: Secondary | ICD-10-CM | POA: Diagnosis not present

## 2016-01-10 DIAGNOSIS — J45909 Unspecified asthma, uncomplicated: Secondary | ICD-10-CM | POA: Insufficient documentation

## 2016-01-10 DIAGNOSIS — E876 Hypokalemia: Secondary | ICD-10-CM | POA: Insufficient documentation

## 2016-01-10 DIAGNOSIS — Z87891 Personal history of nicotine dependence: Secondary | ICD-10-CM | POA: Insufficient documentation

## 2016-01-10 DIAGNOSIS — R072 Precordial pain: Secondary | ICD-10-CM

## 2016-01-10 DIAGNOSIS — I10 Essential (primary) hypertension: Secondary | ICD-10-CM | POA: Insufficient documentation

## 2016-01-10 DIAGNOSIS — R1011 Right upper quadrant pain: Secondary | ICD-10-CM

## 2016-01-10 DIAGNOSIS — Z7984 Long term (current) use of oral hypoglycemic drugs: Secondary | ICD-10-CM | POA: Diagnosis not present

## 2016-01-10 DIAGNOSIS — Z7982 Long term (current) use of aspirin: Secondary | ICD-10-CM | POA: Diagnosis not present

## 2016-01-10 DIAGNOSIS — K8020 Calculus of gallbladder without cholecystitis without obstruction: Secondary | ICD-10-CM

## 2016-01-10 DIAGNOSIS — Z79899 Other long term (current) drug therapy: Secondary | ICD-10-CM | POA: Diagnosis not present

## 2016-01-10 LAB — BASIC METABOLIC PANEL
ANION GAP: 9 (ref 5–15)
BUN: 14 mg/dL (ref 6–20)
CALCIUM: 8.9 mg/dL (ref 8.9–10.3)
CO2: 27 mmol/L (ref 22–32)
CREATININE: 0.7 mg/dL (ref 0.44–1.00)
Chloride: 103 mmol/L (ref 101–111)
GFR calc Af Amer: >60 mL/min (ref >60)
GFR calc non Af Amer: >60 mL/min (ref >60)
Glucose, Bld: 160 mg/dL — ABNORMAL HIGH (ref 65–99)
Potassium: 3.3 mmol/L — ABNORMAL LOW (ref 3.5–5.1)
SODIUM: 139 mmol/L (ref 135–145)

## 2016-01-10 LAB — CBC
HCT: 35 % — ABNORMAL LOW (ref 36.0–46.0)
Hemoglobin: 11.8 g/dL — ABNORMAL LOW (ref 12.0–15.0)
MCH: 30.5 pg (ref 26.0–34.0)
MCHC: 33.7 g/dL (ref 30.0–36.0)
MCV: 90.4 fL (ref 78.0–100.0)
PLATELETS: 278 10*3/uL (ref 150–400)
RBC: 3.87 MIL/uL (ref 3.87–5.11)
RDW: 14 % (ref 11.5–15.5)
WBC: 5.4 10*3/uL (ref 4.0–10.5)

## 2016-01-10 LAB — TROPONIN I

## 2016-01-10 MED ORDER — POTASSIUM CHLORIDE CRYS ER 20 MEQ PO TBCR
20.0000 meq | EXTENDED_RELEASE_TABLET | Freq: Once | ORAL | Status: AC
  Administered 2016-01-10: 20 meq via ORAL
  Filled 2016-01-10: qty 1

## 2016-01-10 MED ORDER — PANTOPRAZOLE SODIUM 40 MG PO TBEC: 40.0000 mg | DELAYED_RELEASE_TABLET | Freq: Every day | ORAL | 0 refills | Status: DC

## 2016-01-10 MED ORDER — GI COCKTAIL ~~LOC~~
30.0000 mL | Freq: Once | ORAL | Status: AC
  Administered 2016-01-10: 30 mL via ORAL
  Filled 2016-01-10: qty 30

## 2016-01-10 MED ORDER — FAMOTIDINE 20 MG PO TABS
20.0000 mg | ORAL_TABLET | Freq: Once | ORAL | Status: AC
  Administered 2016-01-10: 20 mg via ORAL
  Filled 2016-01-10: qty 1

## 2016-01-10 MED FILL — PANTOPRAZOLE SOD DR 40 MG T: 40 | 20 days supply | Qty: 20 | Fill #0

## 2016-01-10 NOTE — ED Notes (Signed)
Patient transported to Ultrasound 

## 2016-01-10 NOTE — ED Provider Notes (Signed)
[facility] Provider Note   CSN: [id number] Arrival date & time: 01/10/16  [id number]     History   Chief Complaint Chief Complaint  Patient presents with  . Chest Pain    HPI Krystal Vargas is a 60 y.o. female.  Patient c/o midline, lower sternal area, chest pain for the past couple weeks. Mild to moderate, dull, 'gassy', non radiating. Occurs everyday, for most of the day, but is intermittent. Lasts minutes/hours. Initially noted while eating, at rest, but no consistent exacerbating or alleviating factors. Not associated with activity or exertion. No associated sob, nv or diaphoresis. No palpitations. No pleuritic or persistent pain. +occ non prod cough. No other uri c/o. No fever or chills. No chest pain strain. Occasional heartburn sensation. Denies hx cad or fam hx cad. No leg pain or swelling. No hx dvt or pe. No hx gallstones.    The history is provided by the patient.  Chest Pain   Pertinent negatives include no abdominal pain, no back pain, no fever, no headaches, no palpitations, no shortness of breath and no vomiting.    Past Medical History:  Diagnosis Date  . Asthma   . Diabetes mellitus   . Fibromyalgia   . High cholesterol   . Hypertension     There are no active problems to display for this patient.   Past Surgical History:  Procedure Laterality Date  . TUBAL LIGATION      OB History    No data available       Home Medications    Prior to Admission medications   Medication Sig Start Date End Date Taking? Authorizing Provider  aspirin EC 81 MG tablet Take 81 mg by mouth daily.   Yes Historical Provider, MD  carbamazepine (TEGRETOL XR) 200 MG 12 hr tablet Take 100 mg by mouth 2 (two) times daily.   Yes Historical Provider, MD  glipiZIDE (GLUCOTROL XL) 5 MG 24 hr tablet Take 5 mg by mouth daily with breakfast.   Yes Historical Provider, MD  loratadine (CLARITIN) 10 MG tablet Take 10 mg by mouth daily.   Yes Historical Provider, MD  losartan  (COZAAR) 100 MG tablet Take 100 mg by mouth daily.   Yes Historical Provider, MD  metFORMIN (GLUMETZA) 500 MG (MOD) 24 hr tablet Take 500 mg by mouth daily with breakfast.   Yes Historical Provider, MD  Multiple Vitamins-Minerals (MULTIVITAMIN GUMMIES WOMENS PO) Take 1 capsule by mouth daily.   Yes Historical Provider, MD  NIFEdipine (PROCARDIA-XL/ADALAT CC) 60 MG 24 hr tablet Take 60 mg by mouth daily.   Yes Historical Provider, MD  Omega-3 Fatty Acids (EQL FISH OIL) 1200 MG CAPS Take 1 capsule by mouth daily.   Yes Historical Provider, MD  Rosuvastatin Calcium (CRESTOR PO) Take 5 mg by mouth every morning.    Yes Historical Provider, MD  dextromethorphan-guaiFENesin (MUCINEX DM) 30-600 MG per 12 hr tablet Take 1 tablet by mouth 2 (two) times daily. 08/17/14   Fredia Sorrow, MD  HYDROCHLOROTHIAZIDE PO Take 25 mg by mouth every morning.     Historical Provider, MD  traMADol (ULTRAM) 50 MG tablet Take 50 mg by mouth every 6 (six) hours as needed.    Historical Provider, MD    Family History No family history on file.  Social History Social History  Substance Use Topics  . Smoking status: Former Research scientist (life sciences)  . Smokeless tobacco: Never Used  . Alcohol use Yes     Allergies   Review of patient's  allergies indicates no known allergies.   Review of Systems Review of Systems  Constitutional: Negative for chills and fever.  HENT: Negative for sore throat.   Eyes: Negative for redness.  Respiratory: Negative for shortness of breath.   Cardiovascular: Positive for chest pain. Negative for palpitations and leg swelling.  Gastrointestinal: Negative for abdominal pain and vomiting.  Genitourinary: Negative for flank pain.  Musculoskeletal: Negative for back pain and neck pain.  Skin: Negative for rash.  Neurological: Negative for headaches.  Hematological: Does not bruise/bleed easily.  Psychiatric/Behavioral: Negative for confusion.     Physical Exam Updated Vital Signs BP 135/81 (BP  Location: Left Arm)   Pulse 68   Temp 98.4 F (36.9 C) (Oral)   Resp 16   Ht 5\' 1"  (1.549 m)   Wt 87.5 kg   LMP 05/16/2012   SpO2 97%   BMI 36.47 kg/m   Physical Exam  Constitutional: She appears well-developed and well-nourished. No distress.  HENT:  Mouth/Throat: Oropharynx is clear and moist.  Eyes: Conjunctivae are normal. No scleral icterus.  Neck: Neck supple. No tracheal deviation present.  Cardiovascular: Normal rate, regular rhythm, normal heart sounds and intact distal pulses.  Exam reveals no gallop and no friction rub.   No murmur heard. Pulmonary/Chest: Effort normal and breath sounds normal. No respiratory distress. She exhibits no tenderness.  Abdominal: Soft. Normal appearance. She exhibits no distension and no mass. There is no tenderness. There is no rebound and no guarding.  Musculoskeletal: She exhibits no edema or tenderness.  Neurological: She is alert.  Skin: Skin is warm and dry. No rash noted. She is not diaphoretic.  Psychiatric: She has a normal mood and affect.  Nursing note and vitals reviewed.    ED Treatments / Results  Labs (all labs ordered are listed, but only abnormal results are displayed) Results for orders placed or performed during the hospital encounter of [id number]  Basic metabolic panel  Result Value Ref Range   Sodium 139 135 - 145 mmol/L   Potassium 3.3 (L) 3.5 - 5.1 mmol/L   Chloride 103 101 - 111 mmol/L   CO2 27 22 - 32 mmol/L   Glucose, Bld 160 (H) 65 - 99 mg/dL   BUN 14 6 - 20 mg/dL   Creatinine, Ser 0.70 0.44 - 1.00 mg/dL   Calcium 8.9 8.9 - 10.3 mg/dL   GFR calc non Af Amer >60 >60 mL/min   GFR calc Af Amer >60 >60 mL/min   Anion gap 9 5 - 15  CBC  Result Value Ref Range   WBC 5.4 4.0 - 10.5 K/uL   RBC 3.87 3.87 - 5.11 MIL/uL   Hemoglobin 11.8 (L) 12.0 - 15.0 g/dL   HCT 35.0 (L) 36.0 - 46.0 %   MCV 90.4 78.0 - 100.0 fL   MCH 30.5 26.0 - 34.0 pg   MCHC 33.7 30.0 - 36.0 g/dL   RDW 14.0 11.5 - 15.5 %   Platelets  278 150 - 400 K/uL  Troponin I  Result Value Ref Range   Troponin I <0.03 <0.03 ng/mL  Troponin I  Result Value Ref Range   Troponin I <0.03 <0.03 ng/mL   Dg Chest 2 View  Result Date: 01/10/2016 CLINICAL DATA:  Chest discomfort for 2 weeks radiating to back and jaw EXAM: CHEST  2 VIEW COMPARISON:  08/17/2014 FINDINGS: Minimal enlargement of cardiac silhouette. Mediastinal contours and pulmonary vascularity normal. Lungs clear. No pleural effusion or pneumothorax. Bones unremarkable. IMPRESSION: Minimal enlargement  of cardiac silhouette. No acute abnormalities. Electronically Signed   By: Lavonia Dana M.D.   On: 01/10/2016 08:56   US Abdomen Limited Ruq  Result Date: 01/10/2016 CLINICAL DATA:  Right upper quadrant abdominal pain for 2-3 weeks with nausea, vomiting and diarrhea. EXAM: US ABDOMEN LIMITED - RIGHT UPPER QUADRANT COMPARISON:  None. FINDINGS: Gallbladder: No gallstones or wall thickening visualized. No sonographic Murphy sign noted by sonographer. Common bile duct: Diameter: 2.5 mm Liver: Normal echogenicity without focal lesion or biliary dilatation. IMPRESSION: Unremarkable right upper quadrant ultrasound examination. Electronically Signed   By: Marijo Sanes M.D.   On: 01/10/2016 10:21    EKG  ED ECG REPORT   Date: 01/10/2016  Rate: 64  Rhythm: normal sinus rhythm  QRS Axis: normal  Intervals: normal  ST/T Wave abnormalities: nonspecific T wave changes  Conduction Disutrbances:none  Narrative Interpretation:   Old EKG Reviewed: none available  I have personally reviewed the EKG tracing  Radiology No results found.  Procedures Procedures (including critical care time)  Medications Ordered in ED Medications  famotidine (PEPCID) tablet 20 mg (not administered)  gi cocktail (Maalox,Lidocaine,Donnatal) (not administered)     Initial Impression / Assessment and Plan / ED Course  I have reviewed the triage vital signs and the nursing notes.  Pertinent labs &  imaging results that were available during my care of the patient were reviewed by me and considered in my medical decision making (see chart for details).  Clinical Course    Iv ns. Continuous pulse ox and monitor.   Cxr. Ecg. Labs sent.  Will try gi meds for symptom relief.  Recheck, pt comfortable. No pain or distress.  After symptoms for past 2 weeks, trop x 2 normal.   U/s neg.  k sl low. kcl po.  Patient currently appears stable for d/c.    Final Clinical Impressions(s) / ED Diagnoses   Final diagnoses:  None    New Prescriptions New Prescriptions   No medications on file     Lajean Saver, MD 01/10/16 1228

## 2016-01-10 NOTE — Discharge Instructions (Signed)
It was our pleasure to provide your ER care today - we hope that you feel better.  Take protonix (acid blocker medication). You may also try maalox or mylanta as need, if gi symptoms.  Your potassium level is mildly low (3.3) - eat plenty of fruits and vegetables, and follow up with your doctor in 1 week.   For chest pain, follow up with cardiologist as outpatient - see referral - call office to arrange appointment.   Return to ER if worse, new symptoms, persistent/recurrent chest pain, trouble breathing, other concern.

## 2016-01-10 NOTE — ED Triage Notes (Signed)
Pt reports centralized chest "discomfort" for a couple of weeks and states "it moves to my jaw but I thought it was just my raggedy teeth and it just started to move to my back" denies cardiac hx. Pt is a/o NAD at triage.

## 2016-01-10 NOTE — ED Notes (Signed)
Patient transported to X-ray 

## 2016-01-10 NOTE — ED Notes (Signed)
MD at bedside. 

## 2016-04-05 ENCOUNTER — Encounter (HOSPITAL_BASED_OUTPATIENT_CLINIC_OR_DEPARTMENT_OTHER): Payer: Self-pay

## 2016-04-05 ENCOUNTER — Emergency Department (HOSPITAL_BASED_OUTPATIENT_CLINIC_OR_DEPARTMENT_OTHER): Payer: Medicare HMO

## 2016-04-05 ENCOUNTER — Emergency Department (HOSPITAL_BASED_OUTPATIENT_CLINIC_OR_DEPARTMENT_OTHER)
Admission: EM | Admit: 2016-04-05 | Discharge: 2016-04-05 | Disposition: A | Discharge status: Home / Self Care | Payer: Medicare HMO | Attending: Emergency Medicine | Admitting: Emergency Medicine

## 2016-04-05 DIAGNOSIS — J45909 Unspecified asthma, uncomplicated: Secondary | ICD-10-CM | POA: Insufficient documentation

## 2016-04-05 DIAGNOSIS — Z7984 Long term (current) use of oral hypoglycemic drugs: Secondary | ICD-10-CM | POA: Diagnosis not present

## 2016-04-05 DIAGNOSIS — E119 Type 2 diabetes mellitus without complications: Secondary | ICD-10-CM | POA: Diagnosis not present

## 2016-04-05 DIAGNOSIS — Z7982 Long term (current) use of aspirin: Secondary | ICD-10-CM | POA: Insufficient documentation

## 2016-04-05 DIAGNOSIS — R079 Chest pain, unspecified: Secondary | ICD-10-CM | POA: Diagnosis not present

## 2016-04-05 DIAGNOSIS — I10 Essential (primary) hypertension: Secondary | ICD-10-CM | POA: Diagnosis not present

## 2016-04-05 DIAGNOSIS — F17200 Nicotine dependence, unspecified, uncomplicated: Secondary | ICD-10-CM | POA: Diagnosis not present

## 2016-04-05 DIAGNOSIS — R0789 Other chest pain: Secondary | ICD-10-CM | POA: Diagnosis present

## 2016-04-05 LAB — CBC
HEMATOCRIT: 36.9 % (ref 36.0–46.0)
Hemoglobin: 12.3 g/dL (ref 12.0–15.0)
MCH: 30.7 pg (ref 26.0–34.0)
MCHC: 33.3 g/dL (ref 30.0–36.0)
MCV: 92 fL (ref 78.0–100.0)
PLATELETS: 307 10*3/uL (ref 150–400)
RBC: 4.01 MIL/uL (ref 3.87–5.11)
RDW: 13.6 % (ref 11.5–15.5)
WBC: 9 10*3/uL (ref 4.0–10.5)

## 2016-04-05 LAB — BASIC METABOLIC PANEL
Anion gap: 8 (ref 5–15)
BUN: 23 mg/dL — AB (ref 6–20)
CHLORIDE: 99 mmol/L — AB (ref 101–111)
CO2: 30 mmol/L (ref 22–32)
CREATININE: 0.99 mg/dL (ref 0.44–1.00)
Calcium: 9.8 mg/dL (ref 8.9–10.3)
GFR calc Af Amer: >60 mL/min (ref >60)
GFR calc non Af Amer: >60 mL/min (ref >60)
Glucose, Bld: 145 mg/dL — ABNORMAL HIGH (ref 65–99)
POTASSIUM: 3.3 mmol/L — AB (ref 3.5–5.1)
Sodium: 137 mmol/L (ref 135–145)

## 2016-04-05 LAB — TROPONIN I
Troponin I: <0.03 ng/mL (ref <0.03)
Troponin I: <0.03 ng/mL (ref <0.03)

## 2016-04-05 NOTE — ED Triage Notes (Signed)
Pt later stated she was treated with prednisone/inhaler last week for a cough

## 2016-04-05 NOTE — Discharge Instructions (Signed)
Please follow up with your primary care doctor as soon as possible to arrange for a stress test of your heart. If you have any worsening chest pain, shortness of breath, nausea/vomiting, sweatiness, or lightheadedness, please seek immediate medical attention.

## 2016-04-05 NOTE — ED Triage Notes (Signed)
CP x 3 days-NAD-steady gait

## 2016-04-05 NOTE — ED Provider Notes (Signed)
[facility] DEPT Provider Note   CSN: [id number] Arrival date & time: 04/05/16  1711  By signing my name below, I, Gwenlyn Fudge, attest that this documentation has been prepared under the direction and in the presence of Sharlett Iles, MD. Electronically Signed: Gwenlyn Fudge, ED Scribe. 04/05/16. 6:54 PM.   History   Chief Complaint Chief Complaint  Patient presents with  . Chest Pain   The history is provided by the patient. No language interpreter was used.   HPI Comments: Krystal Vargas is a 60 y.o. female with PMHx of Asthma, DM, HTN, and Fibromyalgia who presents to the Emergency Department complaining of episodic, non-radiating, pressure-like chest pain onset 3 days PTA.  Pt has not experienced similar chest pain before. She states pain is made worse when laying on her back and is better when laying on her side. Chest pain is unchanged with movement, strenuous activity, deep inhalation, or cough. Episodes last a few minutes before self resolving. She has been feeling "woozy" and reports recently elevated blood pressure and blood sugar. Pt denies hx of hormone therapy, recent travel/flights, DVT/PE, cancer and leg swelling. Pt's daughter had MI at 24. She has hx of smoking, but quit years ago. She is compliant with medications. She denies shortness of breath, nausea, vomiting. Pt was treated last week with Prednisone and inhaler for cough 1 week ago and states cough has significantly improved with treatment.  Past Medical History:  Diagnosis Date  . Asthma   . Diabetes mellitus   . Fibromyalgia   . High cholesterol   . Hypertension     There are no active problems to display for this patient.  Past Surgical History:  Procedure Laterality Date  . TUBAL LIGATION      OB History    No data available     Home Medications    Prior to Admission medications   Medication Sig Start Date End Date Taking? Authorizing Provider  aspirin EC 81 MG tablet Take 81 mg by mouth  daily.    Historical Provider, MD  carbamazepine (TEGRETOL XR) 200 MG 12 hr tablet Take 100 mg by mouth 2 (two) times daily.    Historical Provider, MD  glipiZIDE (GLUCOTROL XL) 5 MG 24 hr tablet Take 5 mg by mouth daily with breakfast.    Historical Provider, MD  HYDROCHLOROTHIAZIDE PO Take 25 mg by mouth every morning.     Historical Provider, MD  loratadine (CLARITIN) 10 MG tablet Take 10 mg by mouth daily.    Historical Provider, MD  losartan (COZAAR) 100 MG tablet Take 100 mg by mouth daily.    Historical Provider, MD  metFORMIN (GLUMETZA) 500 MG (MOD) 24 hr tablet Take 500 mg by mouth daily with breakfast.    Historical Provider, MD  Multiple Vitamins-Minerals (MULTIVITAMIN GUMMIES WOMENS PO) Take 1 capsule by mouth daily.    Historical Provider, MD  NIFEdipine (PROCARDIA-XL/ADALAT CC) 60 MG 24 hr tablet Take 60 mg by mouth daily.    Historical Provider, MD  Omega-3 Fatty Acids (EQL FISH OIL) 1200 MG CAPS Take 1 capsule by mouth daily.    Historical Provider, MD  pantoprazole (PROTONIX) 40 MG tablet Take 1 tablet (40 mg total) by mouth daily. 01/10/16   Lajean Saver, MD  Rosuvastatin Calcium (CRESTOR PO) Take 5 mg by mouth every morning.     Historical Provider, MD  traMADol (ULTRAM) 50 MG tablet Take 50 mg by mouth every 6 (six) hours as needed.    Historical Provider,  MD   Family History No family history on file.  Social History Social History  Substance Use Topics  . Smoking status: Current Every Day Smoker  . Smokeless tobacco: Never Used  . Alcohol use Yes     Comment: occ    Allergies   Patient has no known allergies.  Review of Systems Review of Systems   10 Systems reviewed and are negative for acute change except as noted in the HPI.   Physical Exam Updated Vital Signs BP (!) 183/101   Pulse 65   Temp 98.6 F (37 C) (Oral)   Resp 20   Ht 5\' 1"  (1.549 m)   Wt 203 lb (92.1 kg)   LMP 05/16/2012   SpO2 98%   BMI 38.36 kg/m   Physical Exam  Constitutional:  She is oriented to person, place, and time. She appears well-developed and well-nourished. No distress.  HENT:  Head: Normocephalic and atraumatic.  Moist mucous membranes  Eyes: Conjunctivae are normal. Pupils are equal, round, and reactive to light.  Neck: Neck supple.  Cardiovascular: Normal rate, regular rhythm and normal heart sounds.   No murmur heard. Pulmonary/Chest: Effort normal and breath sounds normal.  Abdominal: Soft. Bowel sounds are normal. She exhibits no distension. There is no tenderness.  Musculoskeletal: She exhibits no edema.  Neurological: She is alert and oriented to person, place, and time.  Fluent speech  Skin: Skin is warm and dry.  Psychiatric: She has a normal mood and affect. Judgment normal.  Nursing note and vitals reviewed.  ED Treatments / Results  DIAGNOSTIC STUDIES: Oxygen Saturation is 98% on RA, normal by my interpretation.    COORDINATION OF CARE: 6:52 PM Discussed treatment plan with pt at bedside which includes Re-check lab work at 9 PM and pt agreed to plan.  Labs (all labs ordered are listed, but only abnormal results are displayed) Labs Reviewed  BASIC METABOLIC PANEL - Abnormal; Notable for the following:       Result Value   Potassium 3.3 (*)    Chloride 99 (*)    Glucose, Bld 145 (*)    BUN 23 (*)    All other components within normal limits  CBC  TROPONIN I  TROPONIN I    EKG  EKG Interpretation  Date/Time:  Friday April 05 2016 17:22:02 EST Ventricular Rate:  80 PR Interval:  150 QRS Duration: 84 QT Interval:  368 QTC Calculation: 424 R Axis:   62 Text Interpretation:  Normal sinus rhythm T wave abnormality, consider inferior ischemia Abnormal ECG T wave inversions in inferior leads similar to previous, inversions in V4-V6 more pronounced Confirmed by Ulrick Methot MD, Chawn Spraggins [phone]) on 04/05/2016 5:31:58 PM       Radiology Dg Chest 2 View  Result Date: 04/05/2016 CLINICAL DATA:  Acute onset of generalized chest  pain. Initial encounter. EXAM: CHEST  2 VIEW COMPARISON:  Chest radiograph performed 01/10/2016 FINDINGS: The lungs are well-aerated. Mild vascular congestion is noted. There is no evidence of focal opacification, pleural effusion or pneumothorax. The heart is borderline normal in size. No acute osseous abnormalities are seen. IMPRESSION: Mild vascular congestion noted.  Lungs remain grossly clear. Electronically Signed   By: Garald Balding M.D.   On: 04/05/2016 18:14    Procedures Procedures (including critical care time)  Medications Ordered in ED Medications - No data to display   Initial Impression / Assessment and Plan / ED Course  I have reviewed the triage vital signs and the nursing notes.  Pertinent labs & imaging results that were available during my care of the patient were reviewed by me and considered in my medical decision making (see chart for details).  Clinical Course    Patient with 3 days of intermittent chest pain not associated with exertion or any other symptoms. She was well-appearing on exam. Vital signs notable for hypertension. Initial EKG without ischemic changes. Troponin and initial lab work negative. Chest x-ray negative for acute process. The patient has no risk factors for PE, has normal oxygen saturation here, and denies any shortness of breath therefore I feel PE is very unlikely. No concerning features of chest pain such as exertional chest pain, radiation, or nausea/vomiting/diaphoresis. Given her comorbidities, the patient's HEART score is 3-4. I discussed treatment options including admission for cardiac w/u. The patient did not want to be admitted to the hospital and instead elected to have serial troponins. Repeat trop negative. Pt wants to go home, therefore emphasized the importance of close PCP follow-up for outpatient stress testing. Return precautions extensively reviewed. Patient voiced understanding and was discharged in satisfactory condition.   I  personally performed the services described in this documentation, which was scribed in my presence. The recorded information has been reviewed and is accurate.   Final Clinical Impressions(s) / ED Diagnoses   Final diagnoses:  Chest pain, unspecified type    New Prescriptions Discharge Medication List as of 04/05/2016 11:06 PM       Sharlett Iles, MD 04/07/16 [phone]

## 2017-02-07 ENCOUNTER — Encounter (HOSPITAL_BASED_OUTPATIENT_CLINIC_OR_DEPARTMENT_OTHER): Payer: Self-pay | Admitting: Emergency Medicine

## 2017-02-07 ENCOUNTER — Emergency Department (HOSPITAL_BASED_OUTPATIENT_CLINIC_OR_DEPARTMENT_OTHER): Payer: Medicare Other

## 2017-02-07 ENCOUNTER — Observation Stay (HOSPITAL_BASED_OUTPATIENT_CLINIC_OR_DEPARTMENT_OTHER)
Admission: EM | Admit: 2017-02-07 | Discharge: 2017-02-08 | Disposition: A | Discharge status: Home / Self Care | Payer: Medicare Other | Attending: Internal Medicine | Admitting: Internal Medicine

## 2017-02-07 DIAGNOSIS — I249 Acute ischemic heart disease, unspecified: Secondary | ICD-10-CM

## 2017-02-07 DIAGNOSIS — E876 Hypokalemia: Secondary | ICD-10-CM | POA: Diagnosis present

## 2017-02-07 DIAGNOSIS — M25512 Pain in left shoulder: Secondary | ICD-10-CM | POA: Diagnosis not present

## 2017-02-07 DIAGNOSIS — E785 Hyperlipidemia, unspecified: Secondary | ICD-10-CM | POA: Insufficient documentation

## 2017-02-07 DIAGNOSIS — E7800 Pure hypercholesterolemia, unspecified: Secondary | ICD-10-CM | POA: Insufficient documentation

## 2017-02-07 DIAGNOSIS — Z87891 Personal history of nicotine dependence: Secondary | ICD-10-CM | POA: Insufficient documentation

## 2017-02-07 DIAGNOSIS — Z79899 Other long term (current) drug therapy: Secondary | ICD-10-CM | POA: Insufficient documentation

## 2017-02-07 DIAGNOSIS — Z794 Long term (current) use of insulin: Secondary | ICD-10-CM | POA: Insufficient documentation

## 2017-02-07 DIAGNOSIS — E119 Type 2 diabetes mellitus without complications: Secondary | ICD-10-CM | POA: Insufficient documentation

## 2017-02-07 DIAGNOSIS — J45909 Unspecified asthma, uncomplicated: Secondary | ICD-10-CM | POA: Diagnosis not present

## 2017-02-07 DIAGNOSIS — M79602 Pain in left arm: Secondary | ICD-10-CM | POA: Insufficient documentation

## 2017-02-07 DIAGNOSIS — Z7982 Long term (current) use of aspirin: Secondary | ICD-10-CM | POA: Diagnosis not present

## 2017-02-07 DIAGNOSIS — R0602 Shortness of breath: Secondary | ICD-10-CM | POA: Diagnosis present

## 2017-02-07 DIAGNOSIS — R0789 Other chest pain: Principal | ICD-10-CM | POA: Insufficient documentation

## 2017-02-07 DIAGNOSIS — R079 Chest pain, unspecified: Secondary | ICD-10-CM | POA: Diagnosis present

## 2017-02-07 DIAGNOSIS — C541 Malignant neoplasm of endometrium: Secondary | ICD-10-CM | POA: Diagnosis not present

## 2017-02-07 DIAGNOSIS — I700 Atherosclerosis of aorta: Secondary | ICD-10-CM | POA: Insufficient documentation

## 2017-02-07 DIAGNOSIS — K219 Gastro-esophageal reflux disease without esophagitis: Secondary | ICD-10-CM | POA: Diagnosis not present

## 2017-02-07 DIAGNOSIS — I10 Essential (primary) hypertension: Secondary | ICD-10-CM | POA: Insufficient documentation

## 2017-02-07 DIAGNOSIS — M797 Fibromyalgia: Secondary | ICD-10-CM | POA: Diagnosis not present

## 2017-02-07 DIAGNOSIS — E1159 Type 2 diabetes mellitus with other circulatory complications: Secondary | ICD-10-CM

## 2017-02-07 DIAGNOSIS — I071 Rheumatic tricuspid insufficiency: Secondary | ICD-10-CM | POA: Insufficient documentation

## 2017-02-07 DIAGNOSIS — E1169 Type 2 diabetes mellitus with other specified complication: Secondary | ICD-10-CM | POA: Diagnosis present

## 2017-02-07 DIAGNOSIS — M79603 Pain in arm, unspecified: Secondary | ICD-10-CM

## 2017-02-07 HISTORY — DX: Malignant (primary) neoplasm, unspecified: C80.1

## 2017-02-07 LAB — BASIC METABOLIC PANEL
ANION GAP: 8 (ref 5–15)
BUN: 23 mg/dL — ABNORMAL HIGH (ref 6–20)
CHLORIDE: 104 mmol/L (ref 101–111)
CO2: 26 mmol/L (ref 22–32)
Calcium: 9.4 mg/dL (ref 8.9–10.3)
Creatinine, Ser: 1.03 mg/dL — ABNORMAL HIGH (ref 0.44–1.00)
GFR calc Af Amer: >60 mL/min (ref >60)
GFR, EST NON AFRICAN AMERICAN: 57 mL/min — AB (ref >60)
GLUCOSE: 135 mg/dL — AB (ref 65–99)
POTASSIUM: 2.8 mmol/L — AB (ref 3.5–5.1)
Sodium: 138 mmol/L (ref 135–145)

## 2017-02-07 LAB — TROPONIN I: Troponin I: <0.03 ng/mL (ref <0.03)

## 2017-02-07 LAB — CBC
HEMATOCRIT: 36.3 % (ref 36.0–46.0)
Hemoglobin: 11.8 g/dL — ABNORMAL LOW (ref 12.0–15.0)
MCH: 29.9 pg (ref 26.0–34.0)
MCHC: 32.5 g/dL (ref 30.0–36.0)
MCV: 91.9 fL (ref 78.0–100.0)
PLATELETS: 303 10*3/uL (ref 150–400)
RBC: 3.95 MIL/uL (ref 3.87–5.11)
RDW: 14.3 % (ref 11.5–15.5)
WBC: 7.1 10*3/uL (ref 4.0–10.5)

## 2017-02-07 MED ORDER — ZOLPIDEM TARTRATE 5 MG PO TABS: 5.0000 mg | ORAL_TABLET | Freq: Every evening | ORAL | Status: DC | PRN

## 2017-02-07 MED ORDER — OXYCODONE-ACETAMINOPHEN 5-325 MG PO TABS
1.0000 | ORAL_TABLET | ORAL | Status: DC | PRN
  Filled 2017-02-07: qty 1

## 2017-02-07 MED ORDER — ASPIRIN EC 325 MG PO TBEC
325.0000 mg | DELAYED_RELEASE_TABLET | Freq: Every day | ORAL | Status: DC
  Administered 2017-02-08 (×2): 325 mg via ORAL
  Filled 2017-02-07 (×2): qty 1

## 2017-02-07 MED ORDER — NIFEDIPINE ER 60 MG PO TB24
60.0000 mg | ORAL_TABLET | Freq: Every day | ORAL | Status: DC
  Administered 2017-02-08: 60 mg via ORAL
  Filled 2017-02-07: qty 1

## 2017-02-07 MED ORDER — ADULT MULTIVITAMIN W/MINERALS CH
1.0000 | ORAL_TABLET | Freq: Every day | ORAL | Status: DC
  Administered 2017-02-08: 1 via ORAL
  Filled 2017-02-07: qty 1

## 2017-02-07 MED ORDER — LOSARTAN POTASSIUM 50 MG PO TABS: 100.0000 mg | ORAL_TABLET | Freq: Every day | ORAL | Status: DC

## 2017-02-07 MED ORDER — MORPHINE SULFATE (PF) 4 MG/ML IV SOLN: 2.0000 mg | INTRAVENOUS | Status: DC | PRN

## 2017-02-07 MED ORDER — ENOXAPARIN SODIUM 40 MG/0.4ML ~~LOC~~ SOLN
40.0000 mg | Freq: Every day | SUBCUTANEOUS | Status: DC
  Administered 2017-02-08: 40 mg via SUBCUTANEOUS
  Filled 2017-02-07: qty 0.4

## 2017-02-07 MED ORDER — OMEGA-3-ACID ETHYL ESTERS 1 G PO CAPS
1.0000 g | ORAL_CAPSULE | Freq: Every day | ORAL | Status: DC
  Administered 2017-02-08: 1 g via ORAL
  Filled 2017-02-07: qty 1

## 2017-02-07 MED ORDER — ROSUVASTATIN CALCIUM 5 MG PO TABS: 5.0000 mg | ORAL_TABLET | Freq: Every morning | ORAL | Status: DC

## 2017-02-07 MED ORDER — IOPAMIDOL (ISOVUE-370) INJECTION 76%
100.0000 mL | Freq: Once | INTRAVENOUS | Status: AC | PRN
  Administered 2017-02-07: 100 mL via INTRAVENOUS

## 2017-02-07 MED ORDER — MAGNESIUM SULFATE IN D5W 1-5 GM/100ML-% IV SOLN
1.0000 g | Freq: Once | INTRAVENOUS | Status: AC
  Administered 2017-02-07: 1 g via INTRAVENOUS
  Filled 2017-02-07: qty 100

## 2017-02-07 MED ORDER — SODIUM CHLORIDE 0.9 % IV SOLN
INTRAVENOUS | Status: DC
  Administered 2017-02-08 (×2): via INTRAVENOUS

## 2017-02-07 MED ORDER — MORPHINE SULFATE (PF) 2 MG/ML IV SOLN
2.0000 mg | INTRAVENOUS | Status: DC | PRN
  Administered 2017-02-07: 2 mg via INTRAVENOUS
  Filled 2017-02-07: qty 1

## 2017-02-07 MED ORDER — ACETAMINOPHEN 325 MG PO TABS
650.0000 mg | ORAL_TABLET | ORAL | Status: DC | PRN
Start: 2017-02-07 — End: 2017-02-08

## 2017-02-07 MED ORDER — CARBAMAZEPINE ER 100 MG PO TB12
100.0000 mg | ORAL_TABLET | Freq: Two times a day (BID) | ORAL | Status: DC
  Administered 2017-02-08 (×2): 100 mg via ORAL
  Filled 2017-02-07 (×3): qty 1

## 2017-02-07 MED ORDER — NITROGLYCERIN 0.4 MG SL SUBL
0.4000 mg | SUBLINGUAL_TABLET | SUBLINGUAL | Status: DC | PRN
  Administered 2017-02-07: 0.4 mg via SUBLINGUAL
  Filled 2017-02-07: qty 1

## 2017-02-07 MED ORDER — ONDANSETRON HCL 4 MG/2ML IJ SOLN: 4.0000 mg | Freq: Three times a day (TID) | INTRAMUSCULAR | Status: DC | PRN

## 2017-02-07 MED ORDER — POTASSIUM CHLORIDE CRYS ER 20 MEQ PO TBCR
40.0000 meq | EXTENDED_RELEASE_TABLET | Freq: Once | ORAL | Status: AC
Start: 2017-02-07 — End: 2017-02-07
  Administered 2017-02-07: 40 meq via ORAL
  Filled 2017-02-07: qty 2

## 2017-02-07 MED ORDER — PANTOPRAZOLE SODIUM 40 MG PO TBEC
40.0000 mg | DELAYED_RELEASE_TABLET | Freq: Every day | ORAL | Status: DC
  Administered 2017-02-08: 40 mg via ORAL
  Filled 2017-02-07: qty 1

## 2017-02-07 MED ORDER — ALBUTEROL SULFATE (2.5 MG/3ML) 0.083% IN NEBU: 2.5000 mg | INHALATION_SOLUTION | RESPIRATORY_TRACT | Status: DC | PRN

## 2017-02-07 MED ORDER — HYDRALAZINE HCL 20 MG/ML IJ SOLN: 5.0000 mg | INTRAMUSCULAR | Status: DC | PRN

## 2017-02-07 MED ORDER — POTASSIUM CHLORIDE CRYS ER 20 MEQ PO TBCR
40.0000 meq | EXTENDED_RELEASE_TABLET | Freq: Once | ORAL | Status: AC
  Administered 2017-02-07: 40 meq via ORAL
  Filled 2017-02-07: qty 2

## 2017-02-07 MED ORDER — LORATADINE 10 MG PO TABS
10.0000 mg | ORAL_TABLET | Freq: Every day | ORAL | Status: DC
  Administered 2017-02-08: 10 mg via ORAL
  Filled 2017-02-07: qty 1

## 2017-02-07 MED ORDER — HYDROCHLOROTHIAZIDE 25 MG PO TABS: 25.0000 mg | ORAL_TABLET | Freq: Every morning | ORAL | Status: DC

## 2017-02-07 NOTE — ED Notes (Signed)
Pt on auto VS  

## 2017-02-07 NOTE — H&P (Signed)
History and Physical    Krystal Vargas [id number] DOB: July 14, 1955 DOA: 02/07/2017  Referring MD/NP/PA:   PCP: Heron Nay, PA   Patient coming from:  The patient is coming from home.  At baseline, pt is independent for most of ADL.   Chief Complaint: chest pressure, shortness of breath and left arm pain  HPI: Krystal Vargas is a 61 y.o. female with medical history significant of Hypertension, hyperlipidemia, diabetes mellitus, asthma, GERD, newly diagnosed endometrial cancer, who presents with chest pressure, shortness of breath and left arm pain.  Patient states that she started having chest pressure today. The chest pressure is located in the substernal area, constant, 8 out of 10 in severity, nonradiating. It is associated with shortness of breath. She has mild dry cough, but no fever or chills. Patient also reports that she has been having left arm pain, which has worsened in the past 5 days. The pain is located in the medial side of left arm, worse on the upper arm. No hx of injury. Patient states that she has mild lower abdominal pain, but no numbness, vomiting, diarrhea or symptoms of UTI. No unilateral weakness. Patient states that she is scheduled for endometrial cancer surgery by GYN Dr. Nathaneil Canary at [facility] on 02/18/17.   ED Course: pt was found to have WBC 7.1, negative troponin, potassium 2.8 which was repleted with 40 mEq 2 of oral potassium chloride, 1 g of magnesium sulfate was given. Creatinine normal, temperature 99.4, no tachycardia, oxygen saturation 95% on room air. Chest x-ray negative. CT angiogram of the chest is negative for PE. Patient is placed on telemetry bed for observation.  Review of Systems:   General: no fevers, chills, no body weight gain,  has fatigue HEENT: no blurry vision, hearing changes or sore throat Respiratory: has dyspnea, coughing, no wheezing CV: has chest pressure, no palpitations GI: no nausea, vomiting, has mild lower abdominal pain, no  diarrhea, constipation GU: no dysuria, burning on urination, increased urinary frequency, hematuria  Ext: no leg edema Neuro: no unilateral weakness, numbness, or tingling, no vision change or hearing loss Skin: no rash, no skin tear. MSK: No muscle spasm, no deformity, no limitation of range of movement in spin Heme: No easy bruising.  Travel history: No recent long distant travel.  Allergy: No Known Allergies  Past Medical History:  Diagnosis Date  . Asthma   . Cancer (HCC)   . Diabetes mellitus   . Fibromyalgia   . High cholesterol   . Hypertension     Past Surgical History:  Procedure Laterality Date  . TUBAL LIGATION      Social History:  reports that she has quit smoking. She has never used smokeless tobacco. She reports that she drinks alcohol. She reports that she does not use drugs.  Family History:  Family History  Problem Relation Age of Onset  . Hypertension Mother   . Diabetes Mellitus II Mother   . Bronchitis Father   . Hypertension Sister   . Diabetes Mellitus II Sister   . Hypertension Brother   . Diabetes Mellitus II Brother      Prior to Admission medications   Medication Sig Start Date End Date Taking? Authorizing Provider  aspirin EC 81 MG tablet Take 81 mg by mouth daily.   Yes [provider]  glipiZIDE (GLUCOTROL XL) 5 MG 24 hr tablet Take 5 mg by mouth daily with breakfast.   Yes [provider]  losartan (COZAAR) 100 MG tablet Take  100 mg by mouth daily.   Yes [provider]  metFORMIN (GLUMETZA) 500 MG (MOD) 24 hr tablet Take 500 mg by mouth daily with breakfast.   Yes [provider]  NIFEdipine (PROCARDIA-XL/ADALAT CC) 60 MG 24 hr tablet Take 60 mg by mouth daily.   Yes [provider]  carbamazepine (TEGRETOL XR) 200 MG 12 hr tablet Take 100 mg by mouth 2 (two) times daily.    [provider]  HYDROCHLOROTHIAZIDE PO Take 25 mg by mouth every morning.     [provider]    loratadine (CLARITIN) 10 MG tablet Take 10 mg by mouth daily.    [provider]  Multiple Vitamins-Minerals (MULTIVITAMIN GUMMIES WOMENS PO) Take 1 capsule by mouth daily.    [provider]  Omega-3 Fatty Acids (EQL FISH OIL) 1200 MG CAPS Take 1 capsule by mouth daily.    [provider]  pantoprazole (PROTONIX) 40 MG tablet Take 1 tablet (40 mg total) by mouth daily. 01/10/16   Cathren Laine, MD  Rosuvastatin Calcium (CRESTOR PO) Take 5 mg by mouth every morning.     [provider]  traMADol (ULTRAM) 50 MG tablet Take 50 mg by mouth every 6 (six) hours as needed.    [provider]    Physical Exam: Vitals:   02/07/17 2015 02/07/17 2030 02/07/17 2100 02/07/17 2238  BP: 137/78 137/78 129/83 135/61  Pulse: 65 88 66 68  Resp: 12 19 20 18   Temp:    98.4 F (36.9 C)  TempSrc:    Oral  SpO2: 97% 94% 92% 95%  Weight:      Height:    5\' 1"  (1.549 m)   General: Not in acute distress HEENT:       Eyes: PERRL, EOMI, no scleral icterus.       ENT: No discharge from the ears and nose, no pharynx injection, no tonsillar enlargement.        Neck: No JVD, no bruit, no mass felt. Heme: No neck lymph node enlargement. Cardiac: S1/S2, RRR, No murmurs, No gallops or rubs. Respiratory: No rales, wheezing, rhonchi or rubs. GI: Soft, nondistended, nontender, no rebound pain, no organomegaly, BS present. GU: No hematuria Ext: No pitting leg edema bilaterally. 2+DP/PT pulse bilaterally. Has tenderness in the medial side of left arm, no obvious swelling or cord structure. Musculoskeletal: No joint deformities, No joint redness or warmth, no limitation of ROM in spin. Skin: No rashes.  Neuro: Alert, oriented X3, cranial nerves II-XII grossly intact, moves all extremities normally. Psych: Patient is not psychotic, no suicidal or hemocidal ideation.  Labs on Admission: I have personally reviewed following labs and imaging studies  CBC:  Recent Labs Lab  02/07/17 1714  WBC 7.1  HGB 11.8*  HCT 36.3  MCV 91.9  PLT 303   Basic Metabolic Panel:  Recent Labs Lab 02/07/17 1714 02/08/17 0033  NA 138  --   K 2.8*  --   CL 104  --   CO2 26  --   GLUCOSE 135*  --   BUN 23*  --   CREATININE 1.03*  --   CALCIUM 9.4  --   MG  --  1.8   GFR: Estimated Creatinine Clearance: 54.8 mL/min (A) (by C-G formula based on SCr of 1.03 mg/dL (H)). Liver Function Tests: No results for input(s): AST, ALT, ALKPHOS, BILITOT, PROT, ALBUMIN in the last 168 hours. No results for input(s): LIPASE, AMYLASE in the last 168 hours. No  results for input(s): AMMONIA in the last 168 hours. Coagulation Profile: No results for input(s): INR, PROTIME in the last 168 hours. Cardiac Enzymes:  Recent Labs Lab 02/07/17 1714 02/08/17 0033  CKTOTAL  --  185  TROPONINI <0.03  --    BNP (last 3 results) No results for input(s): PROBNP in the last 8760 hours. HbA1C: No results for input(s): HGBA1C in the last 72 hours. CBG: No results for input(s): GLUCAP in the last 168 hours. Lipid Profile: No results for input(s): CHOL, HDL, LDLCALC, TRIG, CHOLHDL, LDLDIRECT in the last 72 hours. Thyroid Function Tests: No results for input(s): TSH, T4TOTAL, FREET4, T3FREE, THYROIDAB in the last 72 hours. Anemia Panel: No results for input(s): VITAMINB12, FOLATE, FERRITIN, TIBC, IRON, RETICCTPCT in the last 72 hours. Urine analysis: No results found for: COLORURINE, APPEARANCEUR, LABSPEC, PHURINE, GLUCOSEU, HGBUR, BILIRUBINUR, KETONESUR, PROTEINUR, UROBILINOGEN, NITRITE, LEUKOCYTESUR Sepsis Labs: @LABRCNTIP (procalcitonin:4,lacticidven:4) )No results found for this or any previous visit (from the past 240 hour(s)).   Radiological Exams on Admission: Dg Chest 2 View  Result Date: 02/07/2017 CLINICAL DATA:  Left arm pain for 3 months. EXAM: CHEST  2 VIEW COMPARISON:  Chest x-ray 04/05/2016 . FINDINGS: Mediastinum hilar structures normal. Heart size normal. No focal  infiltrate. No pleural effusion or pneumothorax. Degenerative changes thoracic spine with thoracic spine scoliosis . No acute bony abnormality. IMPRESSION: 1.  No acute cardiopulmonary disease. 2. Mild thoracic spine scoliosis with diffuse degenerative change. No acute bony abnormality identified. Electronically Signed   By: Maisie Fus  Register   On: 02/07/2017 15:55   Ct Angio Chest Pe W And/or Wo Contrast  Result Date: 02/07/2017 CLINICAL DATA:  Dyspnea x1 day with left shoulder and arm pain, worsening over the past several days though chronic. EXAM: CT ANGIOGRAPHY CHEST WITH CONTRAST TECHNIQUE: Multidetector CT imaging of the chest was performed using the standard protocol during bolus administration of intravenous contrast. Multiplanar CT image reconstructions and MIPs were obtained to evaluate the vascular anatomy. CONTRAST:  60 cc Isovue 370 IV COMPARISON:  None. FINDINGS: Cardiovascular: Normal size heart. No pericardial effusion or thickening. Minimal aortic atherosclerosis. Preferential opacification of the pulmonary arterial system. Satisfactory opacification of the pulmonary arteries. No acute pulmonary embolus to the segmental levels. Mediastinum/Nodes: No enlarged mediastinal, hilar, or axillary lymph nodes. Thyroid gland, trachea, and esophagus demonstrate no significant findings. Lungs/Pleura: Small focus of extrapleural fat projecting over the left upper lobe is noted, series 5, image 24. Bibasilar dependent atelectasis. No dominant mass, pulmonary consolidation, effusion or pneumothorax. Upper Abdomen: No acute abnormality. Musculoskeletal: No acute or significant osseous findings. Review of the MIP images confirms the above findings. IMPRESSION: 1. No acute pulmonary embolus. 2. Aortic atherosclerosis without dissection or aneurysm. 3. No acute pulmonary disease. Aortic Atherosclerosis (ICD10-I70.0). Electronically Signed   By: Tollie Eth M.D.   On: 02/07/2017 18:46     EKG: Independently  reviewed.  Sinus rhythm, QTC 431, anteroseptal infarction pattern, nonspecific T-wave change.  Assessment/Plan Principal Problem:   Chest pressure Active Problems:   SOB (shortness of breath)   Hypertension   High cholesterol   Asthma   Diabetes mellitus without complication (HCC)   GERD (gastroesophageal reflux disease)   Endometrial cancer (HCC)   Hypokalemia   Left arm pain   Chest pressure: pt has chest pressure which is associated with shortness of breath. CT angiogram is negative for PE or pneumonia. Pt has significant risk factors including hypertension, hyperlipidemia and diabetes mellitus. Initial trop is negative. EKG showed anteroseptal infarction pattern and  a nonspecific T-wave change.  - will place on Tele bed for obs - cycle CE q6 x3 and repeat EKG in the am  - prn Nitroglycerin, Morphine, and aspirin, lipitor  - Risk factor stratification: will check FLP, UDS and A1C  - 2d echo - please call Card in AM - will start IV heparin if trop becomes positive.  HTN: -IV hydralazine when necessary -Continue hyzarr  HLD: -Lipitor  Asthma: stable -prn albuterol nebs  Diabetes mellitus without complication: Last A1c not on record. Patient is taking metformin and glipizide at home -SSI -Check A1c  GERD: -Protonix  Hypokalemia: K= 2.8 on admission. - Repleted - Check Mg level  Left arm pain: Etiology is not clear. -upper extremity venous Doppler to rule out a DVT -prn percocet for pain  Endometrial cancer [facility]): -Scheduled for endometrial cancer surgery by GYN Dr. Nathaneil Canary at [facility] on 02/18/17   DVT ppx: sQ Lovenox Code Status: Full code Family Communication: None at bed side. Disposition Plan:  Anticipate discharge back to previous home environment Consults called:  none Admission status: Obs / tele     Date of Service 02/08/2017    Lorretta Harp [facility] Pager [phone]  If 7PM-7AM, please contact  night-coverage [email or web address] Password [email or web address] 02/08/2017, 2:08 AM

## 2017-02-07 NOTE — ED Provider Notes (Signed)
[facility] Provider Note   CSN: [id number] Arrival date & time: 02/07/17  1525     History   Chief Complaint Chief Complaint  Patient presents with  . Shortness of Breath  . Arm Pain    HPI Krystal Vargas is a 61 y.o. female.  HPI   Patient with a history of type 2 diabetes, hypertension, uterine cancer presenting with shortness of breath and left shoulder pain. Per patient has been short of breath starting today while she was outside working on her garden. Patient denies any worsening of shortness of breath with activity or any improvement with rest. She states that she has chest pressure as well which is constant with onset of shortness of breath. She denies any nausea, vomiting, diaphoresis. She denies any fevers, chills. She denies any swelling in her legs. She states that she has left shoulder pain. This is been going on for about 3 months. She has not been able to discuss this with her primary care physician. As she's had other things that they were adjusting. She states that the pain is worse when she lifts her arm up into the air. She indicates having pain that radiates down to the elbow and around the neck.  Past Medical History:  Diagnosis Date  . Asthma   . Cancer ([place])   . Diabetes mellitus   . Fibromyalgia   . High cholesterol   . Hypertension     Patient Active Problem List   Diagnosis Date Noted  . SOB (shortness of breath) 02/07/2017  . Chest pressure 02/07/2017  . Diabetes mellitus without complication ([place]) [id number]/5465  . GERD (gastroesophageal reflux disease) 02/07/2017  . Endometrial cancer ([place]) 02/07/2017  . Hypokalemia 02/07/2017  . Left arm pain 02/07/2017  . Hypertension   . High cholesterol   . Asthma     Past Surgical History:  Procedure Laterality Date  . TUBAL LIGATION      OB History    No data available       Home Medications    Prior to Admission medications   Medication Sig Start Date End Date Taking? Authorizing  Provider  aspirin EC 81 MG tablet Take 81 mg by mouth daily.   Yes [provider]  glipiZIDE (GLUCOTROL XL) 5 MG 24 hr tablet Take 5 mg by mouth daily with breakfast.   Yes [provider]  losartan (COZAAR) 100 MG tablet Take 100 mg by mouth daily.   Yes [provider]  metFORMIN (GLUMETZA) 500 MG (MOD) 24 hr tablet Take 500 mg by mouth daily with breakfast.   Yes [provider]  NIFEdipine (PROCARDIA-XL/ADALAT CC) 60 MG 24 hr tablet Take 60 mg by mouth daily.   Yes [provider]  carbamazepine (TEGRETOL XR) 200 MG 12 hr tablet Take 100 mg by mouth 2 (two) times daily.    [provider]  HYDROCHLOROTHIAZIDE PO Take 25 mg by mouth every morning.     [provider]  loratadine (CLARITIN) 10 MG tablet Take 10 mg by mouth daily.    [provider]  Multiple Vitamins-Minerals (MULTIVITAMIN GUMMIES WOMENS PO) Take 1 capsule by mouth daily.    [provider]  Omega-3 Fatty Acids (EQL FISH OIL) 1200 MG CAPS Take 1 capsule by mouth daily.    [provider]  pantoprazole (PROTONIX) 40 MG tablet Take 1 tablet (40 mg total) by mouth daily. 01/10/16   Lajean Saver, MD  Rosuvastatin Calcium (CRESTOR PO) Take 5 mg  by mouth every morning.     [provider]  traMADol (ULTRAM) 50 MG tablet Take 50 mg by mouth every 6 (six) hours as needed.    [provider]    Family History No family history on file.  Social History Social History  Substance Use Topics  . Smoking status: Former Research scientist (life sciences)  . Smokeless tobacco: Never Used  . Alcohol use Yes     Comment: occ     Allergies   Patient has no known allergies.   Review of Systems Review of Systems  All other systems reviewed and are negative.    Physical Exam Updated Vital Signs BP 135/61 (BP Location: Left Arm)   Pulse 68   Temp 98.4 F (36.9 C) (Oral)   Resp 18   Ht 5\' 1"  (1.549 m)   Wt 81.6 kg (180 lb)   LMP 05/16/2012    SpO2 95%   BMI 34.01 kg/m   Physical Exam  Constitutional: She appears well-developed and well-nourished. No distress.  HENT:  Head: Normocephalic and atraumatic.  Eyes: Conjunctivae are normal.  Neck: Neck supple.  Cardiovascular: Normal rate and regular rhythm.   No murmur heard. Pulmonary/Chest: Effort normal and breath sounds normal. No respiratory distress.  Abdominal: Soft. There is no tenderness.  Musculoskeletal: She exhibits no edema.  Neurological: She is alert.  Skin: Skin is warm and dry.  Psychiatric: She has a normal mood and affect.  Nursing note and vitals reviewed.    ED Treatments / Results  Labs (all labs ordered are listed, but only abnormal results are displayed) Labs Reviewed  CBC - Abnormal; Notable for the following:       Result Value   Hemoglobin 11.8 (*)    All other components within normal limits  BASIC METABOLIC PANEL - Abnormal; Notable for the following:    Potassium 2.8 (*)    Glucose, Bld 135 (*)    BUN 23 (*)    Creatinine, Ser 1.03 (*)    GFR calc non Af Amer 57 (*)    All other components within normal limits  TROPONIN I  TROPONIN I  TROPONIN I  TROPONIN I    EKG  EKG Interpretation  Date/Time:  Friday February 07 2017 15:36:36 EDT Ventricular Rate:  80 PR Interval:  156 QRS Duration: 88 QT Interval:  374 QTC Calculation: 431 R Axis:   44 Text Interpretation:  Normal sinus rhythm Nonspecific T wave abnormality Abnormal ECG T wave inversions in inferior and precordial leads improved from previous Confirmed by Theotis Burrow [id number]) on 02/07/2017 5:22:03 PM       Radiology Dg Chest 2 View  Result Date: 02/07/2017 CLINICAL DATA:  Left arm pain for 3 months. EXAM: CHEST  2 VIEW COMPARISON:  Chest x-ray 04/05/2016 . FINDINGS: Mediastinum hilar structures normal. Heart size normal. No focal infiltrate. No pleural effusion or pneumothorax. Degenerative changes thoracic spine with thoracic spine scoliosis . No acute bony  abnormality. IMPRESSION: 1.  No acute cardiopulmonary disease. 2. Mild thoracic spine scoliosis with diffuse degenerative change. No acute bony abnormality identified. Electronically Signed   By: Marcello Moores  Register   On: 02/07/2017 15:55   Ct Angio Chest Pe W And/or Wo Contrast  Result Date: 02/07/2017 CLINICAL DATA:  Dyspnea x1 day with left shoulder and arm pain, worsening over the past several days though chronic. EXAM: CT ANGIOGRAPHY CHEST WITH CONTRAST TECHNIQUE: Multidetector CT imaging of the chest was performed using the standard protocol during bolus administration of intravenous  contrast. Multiplanar CT image reconstructions and MIPs were obtained to evaluate the vascular anatomy. CONTRAST:  60 cc Isovue 370 IV COMPARISON:  None. FINDINGS: Cardiovascular: Normal size heart. No pericardial effusion or thickening. Minimal aortic atherosclerosis. Preferential opacification of the pulmonary arterial system. Satisfactory opacification of the pulmonary arteries. No acute pulmonary embolus to the segmental levels. Mediastinum/Nodes: No enlarged mediastinal, hilar, or axillary lymph nodes. Thyroid gland, trachea, and esophagus demonstrate no significant findings. Lungs/Pleura: Small focus of extrapleural fat projecting over the left upper lobe is noted, series 5, image 24. Bibasilar dependent atelectasis. No dominant mass, pulmonary consolidation, effusion or pneumothorax. Upper Abdomen: No acute abnormality. Musculoskeletal: No acute or significant osseous findings. Review of the MIP images confirms the above findings. IMPRESSION: 1. No acute pulmonary embolus. 2. Aortic atherosclerosis without dissection or aneurysm. 3. No acute pulmonary disease. Aortic Atherosclerosis (ICD10-I70.0). Electronically Signed   By: Ashley Royalty M.D.   On: 02/07/2017 18:46    Procedures Procedures (including critical care time)  Medications Ordered in ED Medications  albuterol (PROVENTIL) (2.5 MG/3ML) 0.083% nebulizer  solution 2.5 mg (not administered)  nitroGLYCERIN (NITROSTAT) SL tablet 0.4 mg (0.4 mg Sublingual Given 02/07/17 2019)  morphine 4 MG/ML injection 2 mg (not administered)  iopamidol (ISOVUE-370) 76 % injection 100 mL (100 mLs Intravenous Contrast Given 02/07/17 1828)  potassium chloride SA (K-DUR,KLOR-CON) CR tablet 40 mEq (40 mEq Oral Given 02/07/17 1839)  potassium chloride SA (K-DUR,KLOR-CON) CR tablet 40 mEq (40 mEq Oral Given 02/07/17 2019)  magnesium sulfate IVPB 1 g 100 mL (0 g Intravenous Stopped 02/07/17 2206)     Initial Impression / Assessment and Plan / ED Course  I have reviewed the triage vital signs and the nursing notes.  Pertinent labs & imaging results that were available during my care of the patient were reviewed by me and considered in my medical decision making (see chart for details).   Patient presenting with chest pressure and shortness of breath. She has recently been diagnosed with uterine cancer and is going to have a hysterectomy soon. The patient was ruled out with a negative CTA for PE. Patient has a heart score of 4 and was therefore admitted for ACS rule out. No signs of infection on chest x-ray no symptoms were infection.  Final Clinical Impressions(s) / ED Diagnoses   Final diagnoses:  None    New Prescriptions Current Discharge Medication List       Tonette Bihari, MD 02/07/17 [place], Wenda Overland, MD 02/08/17 1149

## 2017-02-07 NOTE — Progress Notes (Signed)
This is a no charge note  Transfer from [facility] per Dr. Rex Kras  61 year old lady with past medical history of a hypertension, hyperlipidemia, diabetes mellitus, asthma, GERD, newly diagnosed uterine cancer, who presents with SOB and chest pressure. Patient also has left shoulder pain in the past 3 months which has worsened in the past 5 days. CT angiogram of chest is negative for PE, chest x-ray negative.  Patient was found to have WBC 7.1, negative troponin, potassium 2.8 which was repleted with 40 mEq 2 of oral potassium chloride, 1 g of magnesium sulfate was given. Creatinine normal, temperature 99.4, no tachycardia, oxygen saturation 9 5% on room air. Patient is placed on telemetry bed for observation.  Please call manager of [facility] at [phone] when pt arrives to floor   Ivor Costa, MD  [facility] Pager [phone]  If 7PM-7AM, please contact night-coverage [email or web address] Password [facility] 02/07/2017, 8:36 PM

## 2017-02-07 NOTE — ED Triage Notes (Signed)
L arm pain for months, worse since Sunday. SOB since yesterday.

## 2017-02-08 ENCOUNTER — Encounter (HOSPITAL_COMMUNITY): Payer: Medicare Other

## 2017-02-08 ENCOUNTER — Observation Stay (HOSPITAL_BASED_OUTPATIENT_CLINIC_OR_DEPARTMENT_OTHER): Payer: Medicare Other

## 2017-02-08 ENCOUNTER — Encounter (HOSPITAL_COMMUNITY): Payer: Self-pay | Admitting: Internal Medicine

## 2017-02-08 DIAGNOSIS — R079 Chest pain, unspecified: Secondary | ICD-10-CM | POA: Diagnosis not present

## 2017-02-08 DIAGNOSIS — E119 Type 2 diabetes mellitus without complications: Secondary | ICD-10-CM | POA: Diagnosis not present

## 2017-02-08 DIAGNOSIS — M79603 Pain in arm, unspecified: Secondary | ICD-10-CM

## 2017-02-08 DIAGNOSIS — C541 Malignant neoplasm of endometrium: Secondary | ICD-10-CM | POA: Diagnosis not present

## 2017-02-08 DIAGNOSIS — I10 Essential (primary) hypertension: Secondary | ICD-10-CM | POA: Diagnosis not present

## 2017-02-08 DIAGNOSIS — R0789 Other chest pain: Secondary | ICD-10-CM | POA: Diagnosis not present

## 2017-02-08 LAB — COMPREHENSIVE METABOLIC PANEL
ALBUMIN: 3.9 g/dL (ref 3.5–5.0)
ALT: 10 U/L — AB (ref 14–54)
ANION GAP: 7 (ref 5–15)
AST: 19 U/L (ref 15–41)
Alkaline Phosphatase: 51 U/L (ref 38–126)
BUN: 14 mg/dL (ref 6–20)
CHLORIDE: 104 mmol/L (ref 101–111)
CO2: 28 mmol/L (ref 22–32)
Calcium: 8.7 mg/dL — ABNORMAL LOW (ref 8.9–10.3)
Creatinine, Ser: 0.68 mg/dL (ref 0.44–1.00)
GFR calc non Af Amer: >60 mL/min (ref >60)
GLUCOSE: 88 mg/dL (ref 65–99)
Potassium: 3.5 mmol/L (ref 3.5–5.1)
SODIUM: 139 mmol/L (ref 135–145)
Total Bilirubin: 0.7 mg/dL (ref 0.3–1.2)
Total Protein: 6.9 g/dL (ref 6.5–8.1)

## 2017-02-08 LAB — CBC WITH DIFFERENTIAL/PLATELET
BASOS PCT: 0 %
Basophils Absolute: 0 10*3/uL (ref 0.0–0.1)
EOS ABS: 0.1 10*3/uL (ref 0.0–0.7)
EOS PCT: 2 %
HCT: 36.6 % (ref 36.0–46.0)
Hemoglobin: 11.9 g/dL — ABNORMAL LOW (ref 12.0–15.0)
LYMPHS ABS: 3.1 10*3/uL (ref 0.7–4.0)
Lymphocytes Relative: 43 %
MCH: 29.8 pg (ref 26.0–34.0)
MCHC: 32.5 g/dL (ref 30.0–36.0)
MCV: 91.5 fL (ref 78.0–100.0)
Monocytes Absolute: 0.5 10*3/uL (ref 0.1–1.0)
Monocytes Relative: 7 %
NEUTROS PCT: 48 %
Neutro Abs: 3.5 10*3/uL (ref 1.7–7.7)
PLATELETS: 310 10*3/uL (ref 150–400)
RBC: 4 MIL/uL (ref 3.87–5.11)
RDW: 14.6 % (ref 11.5–15.5)
WBC: 7.2 10*3/uL (ref 4.0–10.5)

## 2017-02-08 LAB — RAPID URINE DRUG SCREEN, HOSP PERFORMED
Amphetamines: NOT DETECTED
BARBITURATES: NOT DETECTED
BENZODIAZEPINES: NOT DETECTED
COCAINE: NOT DETECTED
OPIATES: POSITIVE — AB
TETRAHYDROCANNABINOL: NOT DETECTED

## 2017-02-08 LAB — HIV ANTIBODY (ROUTINE TESTING W REFLEX): HIV SCREEN 4TH GENERATION: NONREACTIVE

## 2017-02-08 LAB — HEMOGLOBIN A1C
Hgb A1c MFr Bld: 6.3 % — ABNORMAL HIGH (ref 4.8–5.6)
Mean Plasma Glucose: 134.11 mg/dL

## 2017-02-08 LAB — ECHOCARDIOGRAM COMPLETE
Height: 61 in
Weight: 2880 oz

## 2017-02-08 LAB — MAGNESIUM
Magnesium: 1.8 mg/dL (ref 1.7–2.4)
Magnesium: 1.7 mg/dL (ref 1.7–2.4)

## 2017-02-08 LAB — CK: Total CK: 185 U/L (ref 38–234)

## 2017-02-08 LAB — LIPID PANEL
Cholesterol: 114 mg/dL (ref 0–200)
HDL: 51 mg/dL (ref >40)
LDL Cholesterol: 46 mg/dL (ref 0–99)
TRIGLYCERIDES: 84 mg/dL (ref <150)
Total CHOL/HDL Ratio: 2.2 RATIO
VLDL: 17 mg/dL (ref 0–40)

## 2017-02-08 LAB — GLUCOSE, CAPILLARY
GLUCOSE-CAPILLARY: 87 mg/dL (ref 65–99)
GLUCOSE-CAPILLARY: 225 mg/dL — AB (ref 65–99)
Glucose-Capillary: 104 mg/dL — ABNORMAL HIGH (ref 65–99)

## 2017-02-08 LAB — TROPONIN I

## 2017-02-08 MED ORDER — HYDROCHLOROTHIAZIDE 25 MG PO TABS
25.0000 mg | ORAL_TABLET | Freq: Every day | ORAL | Status: DC
  Administered 2017-02-08: 25 mg via ORAL
  Filled 2017-02-08: qty 1

## 2017-02-08 MED ORDER — INSULIN ASPART 100 UNIT/ML ~~LOC~~ SOLN
0.0000 [IU] | Freq: Three times a day (TID) | SUBCUTANEOUS | Status: DC
  Administered 2017-02-08: 3 [IU] via SUBCUTANEOUS

## 2017-02-08 MED ORDER — LOSARTAN POTASSIUM-HCTZ 100-25 MG PO TABS: 1.0000 | ORAL_TABLET | Freq: Every day | ORAL | Status: DC

## 2017-02-08 MED ORDER — INSULIN ASPART 100 UNIT/ML ~~LOC~~ SOLN
0.0000 [IU] | Freq: Every day | SUBCUTANEOUS | Status: DC
Start: 2017-02-08 — End: 2017-02-08

## 2017-02-08 MED ORDER — MENTHOL 3 MG MT LOZG
1.0000 | LOZENGE | OROMUCOSAL | Status: DC | PRN
  Filled 2017-02-08: qty 9

## 2017-02-08 MED ORDER — NAPROXEN 500 MG PO TABS: 500.0000 mg | ORAL_TABLET | Freq: Two times a day (BID) | ORAL | 0 refills | Status: AC

## 2017-02-08 MED ORDER — ATORVASTATIN CALCIUM 40 MG PO TABS
40.0000 mg | ORAL_TABLET | Freq: Every day | ORAL | Status: DC
  Administered 2017-02-08: 40 mg via ORAL
  Filled 2017-02-08: qty 1

## 2017-02-08 MED ORDER — LOSARTAN POTASSIUM 50 MG PO TABS
100.0000 mg | ORAL_TABLET | Freq: Every day | ORAL | Status: DC
  Administered 2017-02-08: 100 mg via ORAL
  Filled 2017-02-08: qty 2

## 2017-02-08 MED ORDER — METHOCARBAMOL 500 MG PO TABS: 500.0000 mg | ORAL_TABLET | Freq: Three times a day (TID) | ORAL | 0 refills | Status: DC | PRN

## 2017-02-08 NOTE — Progress Notes (Signed)
  Echocardiogram 2D Echocardiogram has been performed.  Krystal Vargas M 02/08/2017, 11:47 AM

## 2017-02-08 NOTE — Consult Note (Signed)
Primary cardiologist:n/a Consulting cardiologist: Dr Carlyle Dolly Requesting physician: Dr Posey Pronto Indication: chest pain  Clinical Summary Krystal Vargas is a 61 y.o.female history of HTN, HL, DM2, asthma, endometrial cancer admitted with chest pain and SOB. She described 2 distinct pain. Left arm pain from shoulder radiating into neck and down into forearm. Ongoing for several weeks but worst since Sunday. Constant pain worst with movement. She developed a chest pressure in midchest yesterday morning around 730AM while gardneing with some SOB. Pain has been constant x 24 hours, worst with movement and breathing.   WBC 7.1 Hgb 11.8 Plt 303 K 2.8 Cr 1.03 Mg 1.8  Trop neg x 2 (12 hrs apart) CXR no acute process CT PE no PE, +aortic atherosclerosis EKG SR, nonspecific ST/T changes Echo pending   No Known Allergies  Medications Scheduled Medications: . aspirin EC  325 mg Oral Daily  . atorvastatin  40 mg Oral Daily  . carbamazepine  100 mg Oral BID  . enoxaparin (LOVENOX) injection  40 mg Subcutaneous QHS  . hydrochlorothiazide  25 mg Oral Daily  . insulin aspart  0-5 Units Subcutaneous QHS  . insulin aspart  0-9 Units Subcutaneous TID WC  . loratadine  10 mg Oral Daily  . losartan  100 mg Oral Daily  . multivitamin with minerals  1 tablet Oral Daily  . NIFEdipine  60 mg Oral Daily  . omega-3 acid ethyl esters  1 g Oral Daily  . pantoprazole  40 mg Oral Daily     Infusions: . sodium chloride 75 mL/hr at 02/08/17 0155     PRN Medications:  acetaminophen, albuterol, hydrALAZINE, menthol-cetylpyridinium, morphine injection, nitroGLYCERIN, ondansetron, oxyCODONE-acetaminophen, zolpidem   Past Medical History:  Diagnosis Date  . Asthma   . Cancer ([place])   . Diabetes mellitus   . Fibromyalgia   . High cholesterol   . Hypertension     Past Surgical History:  Procedure Laterality Date  . TUBAL LIGATION      Family History  Problem Relation Age of Onset  .  Hypertension Mother   . Diabetes Mellitus II Mother   . Bronchitis Father   . Hypertension Sister   . Diabetes Mellitus II Sister   . Hypertension Brother   . Diabetes Mellitus II Brother     Social History Krystal Vargas reports that she has quit smoking. She has never used smokeless tobacco. Krystal Vargas reports that she drinks alcohol.  Review of Systems CONSTITUTIONAL: No weight loss, fever, chills, weakness or fatigue.  HEENT: Eyes: No visual loss, blurred vision, double vision or yellow sclerae. No hearing loss, sneezing, congestion, runny nose or sore throat.  SKIN: No rash or itching.  CARDIOVASCULAR: per hpi RESPIRATORY: per hpi GASTROINTESTINAL: No anorexia, nausea, vomiting or diarrhea. No abdominal pain or blood.  GENITOURINARY: no polyuria, no dysuria NEUROLOGICAL: No headache, dizziness, syncope, paralysis, ataxia, numbness or tingling in the extremities. No change in bowel or bladder control.  MUSCULOSKELETAL: No muscle, back pain, joint pain or stiffness.  HEMATOLOGIC: No anemia, bleeding or bruising.  LYMPHATICS: No enlarged nodes. No history of splenectomy.  PSYCHIATRIC: No history of depression or anxiety.      Physical Examination Blood pressure 132/67, pulse (!) 53, temperature 98 F (36.7 C), temperature source Oral, resp. rate 16, height 5\' 1"  (1.549 m), weight 180 lb (81.6 kg), last menstrual period 05/16/2012, SpO2 92 %. No intake or output data in the 24 hours ending 02/08/17 0823  HEENT: sclera clear, throat clear  Cardiovascular: RRR, no mr/g, no jvd  Respiratory: CTAB  GI: abdomen soft, nt, nd  MSK: no LE edema. Chest wall tender to palpation  Neuro: no focal deficits  Psych: appropriate affect   Lab Results  Basic Metabolic Panel:  Recent Labs Lab 02/07/17 1714 02/08/17 0033  NA 138  --   K 2.8*  --   CL 104  --   CO2 26  --   GLUCOSE 135*  --   BUN 23*  --   CREATININE 1.03*  --   CALCIUM 9.4  --   MG  --  1.8    Liver Function  Tests: No results for input(s): AST, ALT, ALKPHOS, BILITOT, PROT, ALBUMIN in the last 168 hours.  CBC:  Recent Labs Lab 02/07/17 1714  WBC 7.1  HGB 11.8*  HCT 36.3  MCV 91.9  PLT 303    Cardiac Enzymes:  Recent Labs Lab 02/07/17 1714 02/08/17 0033 02/08/17 0508  CKTOTAL  --  185  --   TROPONINI <0.03  --  <0.03    BNP: Invalid input(s): POCBNP     Impression/Recommendations 1. Atypical chest pain - atypical chest and left arm pain not consistent with cardiac etiology. Chest pain ongoing over 24 hrs, worst with breathing and position, reproducible to palpation. EKG and enzymes not consistent with ischeima - f/u echo, if normal then no further workup planned at this time - will write for diet this AM.       Carlyle Dolly, M.D.

## 2017-02-08 NOTE — Progress Notes (Signed)
VASCULAR LAB PRELIMINARY  PRELIMINARY  PRELIMINARY  PRELIMINARY  Left upper extremity venous duplex completed.    Preliminary report:  Left :  No evidence of DVT or superficial thrombosis.    Lexii Walsh, RVS 02/08/2017, 3:35 PM

## 2017-02-10 NOTE — Discharge Summary (Signed)
[facility] Discharge Summary   Patient: Krystal Vargas NFA:[id number]   PCP: Heron Nay, PA DOB: 25-Aug-1955   Date of admission: 02/07/2017   Date of discharge: 02/08/2017    Discharge Diagnoses:  Principal Problem:   Chest pressure Active Problems:   SOB (shortness of breath)   Hypertension   High cholesterol   Asthma   Diabetes mellitus without complication (HCC)   GERD (gastroesophageal reflux disease)   Endometrial cancer (HCC)   Hypokalemia   Left arm pain   Admitted From: home Disposition:  home  Recommendations for Outpatient Follow-up:  1. follow-up with PCP in 2 weeks   Follow-up Information    Heron Nay, PA. Schedule an appointment as soon as possible for a visit in 1 week(s).   Specialty:  Physician Assistant Contact information: [address] [place] [address] [phone]          Diet recommendation: cardiac diet carb modified  Activity: The patient is advised to gradually reintroduce usual activities.  Discharge Condition: good  Code Status: full code  History of present illness: As per the H and P dictated on admission, "Krystal Vargas is a 61 y.o. female with medical history significant of Hypertension, hyperlipidemia, diabetes mellitus, asthma, GERD, newly diagnosed endometrial cancer, who presents with chest pressure, shortness of breath and left arm pain.  Patient states that she started having chest pressure today. The chest pressure is located in the substernal area, constant, 8 out of 10 in severity, nonradiating. It is associated with shortness of breath. She has mild dry cough, but no fever or chills. Patient also reports that she has been having left arm pain, which has worsened in the past 5 days. The pain is located in the medial side of left arm, worse on the upper arm. No hx of injury. Patient states that she has mild lower abdominal pain, but no numbness, vomiting, diarrhea or symptoms of UTI. No unilateral weakness.  Patient states that she is scheduled for endometrial cancer surgery by GYN Dr. Nathaneil Canary at [facility] on 02/18/17.   ED Course: pt was found to have WBC 7.1, negative troponin, potassium 2.8 which was repleted with 40 mEq 2 of oral potassium chloride, 1 g of magnesium sulfate was given. Creatinine normal, temperature 99.4, no tachycardia, oxygen saturation 95% on room air. Chest x-ray negative. CT angiogram of the chest is negative for PE. Patient is placed on telemetry bed for observation."  Hospital Course:  Summary of her active problems in the hospital is as following. Chest pressure: pt has chest pressure which is associated with shortness of breath. CT angiogram is negative for PE or pneumonia. Pt has significant risk factors including hypertension, hyperlipidemia and diabetes mellitus. Initial trop is negative. EKG showed anteroseptal infarction pattern and a nonspecific T-wave change.  Serial troponins were negative, echocardiogram also was showing preserved EF without any wall motion abnormalities. Cardiology was consulted for that her pain was atypical for any ischemic pain. Recommended if echocardiogram is negative no further cardiac workup. Patient was discharged on oral Robaxin and naproxen.  HTN: -IV hydralazine when necessary -Continue hyzarr  HLD: -Lipitor  Asthma: stable -prn albuterol nebs  Diabetes mellitus without complication: Last A1c not on record. Patient is taking metformin and glipizide at home Resume home regimen  GERD: -Protonix  Hypokalemia: K= 2.8 on admission. - Repleted - Check Mg level  Left arm pain: Etiology is not clear. -upper extremity venous Doppler rule out a DVT -prn Robaxin and  naproxen  Endometrial cancer [facility]): -Scheduled for endometrial cancer surgery by GYN Dr. Nathaneil Canary at [facility] on 02/18/17  All other chronic medical condition were stable during the hospitalization.  Patient was ambulatory without any  assistance. On the day of the discharge the patient's vitals were stable, and no other acute medical condition were reported by patient. the patient was felt safe to be discharge at home with family.  Procedures and Results:  Echocardiogram    Consultations:  Cardiology  DISCHARGE MEDICATION: Discharge Medication List as of 02/08/2017  4:40 PM    START taking these medications   Details  methocarbamol (ROBAXIN) 500 MG tablet Take 1 tablet (500 mg total) by mouth every 8 (eight) hours as needed for muscle spasms., Starting Sat 02/08/2017, Normal    naproxen (NAPROSYN) 500 MG tablet Take 1 tablet (500 mg total) by mouth 2 (two) times daily with a meal., Starting Sat 02/08/2017, Until Sun 02/08/2018, Normal      CONTINUE these medications which have NOT CHANGED   Details  aspirin EC 81 MG tablet Take 81 mg by mouth daily., Historical Med    atorvastatin (LIPITOR) 40 MG tablet Take 40 mg by mouth daily., Starting Sat 11/23/2016, Historical Med    glipiZIDE (GLUCOTROL XL) 5 MG 24 hr tablet Take 5 mg by mouth daily with breakfast., Historical Med    loratadine (CLARITIN) 10 MG tablet Take 10 mg by mouth daily., Historical Med    losartan-hydrochlorothiazide (HYZAAR) 100-25 MG tablet Take 1 tablet by mouth daily after breakfast., Starting Sat 11/23/2016, Historical Med    metFORMIN (GLUMETZA) 500 MG (MOD) 24 hr tablet Take 1,000 mg by mouth 2 (two) times daily with a meal. , Historical Med    Multiple Vitamins-Minerals (MULTIVITAMIN GUMMIES WOMENS PO) Take 1 capsule by mouth daily., Historical Med    NIFEdipine (PROCARDIA-XL/ADALAT CC) 60 MG 24 hr tablet Take 60 mg by mouth daily after breakfast. , Historical Med    Omega-3 Fatty Acids (EQL FISH OIL) 1200 MG CAPS Take 1,200 mg by mouth daily. , Historical Med    pantoprazole (PROTONIX) 40 MG tablet Take 1 tablet (40 mg total) by mouth daily., Starting Wed 01/10/2016, Print      STOP taking these medications     carbamazepine (TEGRETOL  XR) 200 MG 12 hr tablet        No Known Allergies Discharge Instructions    Diet - low sodium heart healthy    Complete by:  As directed    Discharge instructions    Complete by:  As directed    It is important that you read following instructions as well as go over your medication list with RN to help you understand your care after this hospitalization.  Discharge Instructions: Please follow-up with PCP in one week  Please request your primary care physician to go over all Hospital Tests and Procedure/Radiological results at the follow up,  Please get all Hospital records sent to your PCP by signing hospital release before you go home.   Do not take more than prescribed Pain, Sleep and Anxiety Medications. You were cared for by a hospitalist during your hospital stay. If you have any questions about your discharge medications or the care you received while you were in the hospital after you are discharged, you can call the unit and ask to speak with the hospitalist on call if the hospitalist that took care of you is not available.  Once you are discharged, your primary care physician will  handle any further medical issues. Please note that NO REFILLS for any discharge medications will be authorized once you are discharged, as it is imperative that you return to your primary care physician (or establish a relationship with a primary care physician if you do not have one) for your aftercare needs so that they can reassess your need for medications and monitor your lab values. You Must read complete instructions/literature along with all the possible adverse reactions/side effects for all the Medicines you take and that have been prescribed to you. Take any new Medicines after you have completely understood and accept all the possible adverse reactions/side effects. Wear Seat belts while driving. If you have smoked or chewed Tobacco in the last 2 yrs please stop smoking and/or stop any  Recreational drug use.   Increase activity slowly    Complete by:  As directed      Discharge Exam: Filed Weights   02/07/17 1531  Weight: 81.6 kg (180 lb)   Vitals:   02/08/17 0603 02/08/17 1512  BP: 132/67 120/64  Pulse: (!) 53 80  Resp: 16 18  Temp: 98 F (36.7 C) 98.3 F (36.8 C)  SpO2: 92% 97%   General: Appear in no distress, no Rash; Oral Mucosa moist. Cardiovascular: S1 and S2 Present, no Murmur, no JVD Respiratory: Bilateral Air entry present and Clear to Auscultation, no Crackles, no wheezes Abdomen: Bowel Sound present, Soft and no tenderness Extremities: n Pedal edema, no calf tenderness Neurology: Grossly no focal neuro deficit.  The results of significant diagnostics from this hospitalization (including imaging, microbiology, ancillary and laboratory) are listed below for reference.    Significant Diagnostic Studies: Dg Chest 2 View  Result Date: 02/07/2017 CLINICAL DATA:  Left arm pain for 3 months. EXAM: CHEST  2 VIEW COMPARISON:  Chest x-ray 04/05/2016 . FINDINGS: Mediastinum hilar structures normal. Heart size normal. No focal infiltrate. No pleural effusion or pneumothorax. Degenerative changes thoracic spine with thoracic spine scoliosis . No acute bony abnormality. IMPRESSION: 1.  No acute cardiopulmonary disease. 2. Mild thoracic spine scoliosis with diffuse degenerative change. No acute bony abnormality identified. Electronically Signed   By: Maisie Fus  Register   On: 02/07/2017 15:55   Ct Angio Chest Pe W And/or Wo Contrast  Result Date: 02/07/2017 CLINICAL DATA:  Dyspnea x1 day with left shoulder and arm pain, worsening over the past several days though chronic. EXAM: CT ANGIOGRAPHY CHEST WITH CONTRAST TECHNIQUE: Multidetector CT imaging of the chest was performed using the standard protocol during bolus administration of intravenous contrast. Multiplanar CT image reconstructions and MIPs were obtained to evaluate the vascular anatomy. CONTRAST:  60 cc Isovue  370 IV COMPARISON:  None. FINDINGS: Cardiovascular: Normal size heart. No pericardial effusion or thickening. Minimal aortic atherosclerosis. Preferential opacification of the pulmonary arterial system. Satisfactory opacification of the pulmonary arteries. No acute pulmonary embolus to the segmental levels. Mediastinum/Nodes: No enlarged mediastinal, hilar, or axillary lymph nodes. Thyroid gland, trachea, and esophagus demonstrate no significant findings. Lungs/Pleura: Small focus of extrapleural fat projecting over the left upper lobe is noted, series 5, image 24. Bibasilar dependent atelectasis. No dominant mass, pulmonary consolidation, effusion or pneumothorax. Upper Abdomen: No acute abnormality. Musculoskeletal: No acute or significant osseous findings. Review of the MIP images confirms the above findings. IMPRESSION: 1. No acute pulmonary embolus. 2. Aortic atherosclerosis without dissection or aneurysm. 3. No acute pulmonary disease. Aortic Atherosclerosis (ICD10-I70.0). Electronically Signed   By: Tollie Eth M.D.   On: 02/07/2017 18:46    Microbiology:  No results found for this or any previous visit (from the past 240 hour(s)).   Labs: CBC:  Recent Labs Lab 02/07/17 1714 02/08/17 0845  WBC 7.1 7.2  NEUTROABS  --  3.5  HGB 11.8* 11.9*  HCT 36.3 36.6  MCV 91.9 91.5  PLT 303 310   Basic Metabolic Panel:  Recent Labs Lab 02/07/17 1714 02/08/17 0033 02/08/17 0845  NA 138  --  139  K 2.8*  --  3.5  CL 104  --  104  CO2 26  --  28  GLUCOSE 135*  --  88  BUN 23*  --  14  CREATININE 1.03*  --  0.68  CALCIUM 9.4  --  8.7*  MG  --  1.8 1.7   Liver Function Tests:  Recent Labs Lab 02/08/17 0845  AST 19  ALT 10*  ALKPHOS 51  BILITOT 0.7  PROT 6.9  ALBUMIN 3.9   No results for input(s): LIPASE, AMYLASE in the last 168 hours. No results for input(s): AMMONIA in the last 168 hours. Cardiac Enzymes:  Recent Labs Lab 02/07/17 1714 02/08/17 0033 02/08/17 0508  CKTOTAL   --  185  --   TROPONINI <0.03  --  <0.03   BNP (last 3 results) No results for input(s): BNP in the last 8760 hours. CBG:  Recent Labs Lab 02/08/17 0723 02/08/17 1143 02/08/17 1712  GLUCAP 87 225* 104*   Time spent: 35 minutes  Signed:  Gabryella Murfin  [facility] 02/08/2017 , 8:33 AM

## 2018-06-09 ENCOUNTER — Observation Stay (HOSPITAL_COMMUNITY)
Admission: EM | Admit: 2018-06-09 | Discharge: 2018-06-10 | Disposition: A | Discharge status: Home / Self Care | Payer: Medicare Other | Attending: Internal Medicine | Admitting: Internal Medicine

## 2018-06-09 ENCOUNTER — Other Ambulatory Visit: Payer: Self-pay

## 2018-06-09 ENCOUNTER — Encounter (HOSPITAL_COMMUNITY): Payer: Self-pay | Admitting: Emergency Medicine

## 2018-06-09 ENCOUNTER — Emergency Department (HOSPITAL_COMMUNITY): Payer: Medicare Other

## 2018-06-09 DIAGNOSIS — Z7984 Long term (current) use of oral hypoglycemic drugs: Secondary | ICD-10-CM | POA: Diagnosis not present

## 2018-06-09 DIAGNOSIS — Z7982 Long term (current) use of aspirin: Secondary | ICD-10-CM | POA: Insufficient documentation

## 2018-06-09 DIAGNOSIS — J45909 Unspecified asthma, uncomplicated: Secondary | ICD-10-CM | POA: Diagnosis not present

## 2018-06-09 DIAGNOSIS — Z87891 Personal history of nicotine dependence: Secondary | ICD-10-CM | POA: Insufficient documentation

## 2018-06-09 DIAGNOSIS — E785 Hyperlipidemia, unspecified: Secondary | ICD-10-CM | POA: Diagnosis not present

## 2018-06-09 DIAGNOSIS — I10 Essential (primary) hypertension: Secondary | ICD-10-CM | POA: Diagnosis not present

## 2018-06-09 DIAGNOSIS — M797 Fibromyalgia: Secondary | ICD-10-CM | POA: Insufficient documentation

## 2018-06-09 DIAGNOSIS — R001 Bradycardia, unspecified: Secondary | ICD-10-CM | POA: Diagnosis not present

## 2018-06-09 DIAGNOSIS — Z8249 Family history of ischemic heart disease and other diseases of the circulatory system: Secondary | ICD-10-CM | POA: Diagnosis not present

## 2018-06-09 DIAGNOSIS — K219 Gastro-esophageal reflux disease without esophagitis: Secondary | ICD-10-CM | POA: Insufficient documentation

## 2018-06-09 DIAGNOSIS — Z79899 Other long term (current) drug therapy: Secondary | ICD-10-CM | POA: Insufficient documentation

## 2018-06-09 DIAGNOSIS — Z833 Family history of diabetes mellitus: Secondary | ICD-10-CM | POA: Diagnosis not present

## 2018-06-09 DIAGNOSIS — E669 Obesity, unspecified: Secondary | ICD-10-CM | POA: Diagnosis present

## 2018-06-09 DIAGNOSIS — E119 Type 2 diabetes mellitus without complications: Secondary | ICD-10-CM | POA: Insufficient documentation

## 2018-06-09 DIAGNOSIS — E1159 Type 2 diabetes mellitus with other circulatory complications: Secondary | ICD-10-CM

## 2018-06-09 DIAGNOSIS — R079 Chest pain, unspecified: Secondary | ICD-10-CM | POA: Diagnosis present

## 2018-06-09 DIAGNOSIS — E7800 Pure hypercholesterolemia, unspecified: Secondary | ICD-10-CM | POA: Diagnosis not present

## 2018-06-09 DIAGNOSIS — E1169 Type 2 diabetes mellitus with other specified complication: Secondary | ICD-10-CM | POA: Diagnosis present

## 2018-06-09 DIAGNOSIS — E876 Hypokalemia: Secondary | ICD-10-CM | POA: Diagnosis not present

## 2018-06-09 DIAGNOSIS — R0789 Other chest pain: Secondary | ICD-10-CM

## 2018-06-09 LAB — CBC
HCT: 41.1 % (ref 36.0–46.0)
HEMOGLOBIN: 12.8 g/dL (ref 12.0–15.0)
MCH: 28.8 pg (ref 26.0–34.0)
MCHC: 31.1 g/dL (ref 30.0–36.0)
MCV: 92.6 fL (ref 80.0–100.0)
Platelets: 225 10*3/uL (ref 150–400)
RBC: 4.44 MIL/uL (ref 3.87–5.11)
RDW: 14.3 % (ref 11.5–15.5)
WBC: 6.7 10*3/uL (ref 4.0–10.5)
nRBC: 0 % (ref 0.0–0.2)

## 2018-06-09 LAB — COMPREHENSIVE METABOLIC PANEL
ALBUMIN: 4.1 g/dL (ref 3.5–5.0)
ALT: 10 U/L (ref 0–44)
AST: 27 U/L (ref 15–41)
Alkaline Phosphatase: 50 U/L (ref 38–126)
Anion gap: 12 (ref 5–15)
BILIRUBIN TOTAL: 1.2 mg/dL (ref 0.3–1.2)
BUN: 16 mg/dL (ref 8–23)
CHLORIDE: 100 mmol/L (ref 98–111)
CO2: 26 mmol/L (ref 22–32)
Calcium: 9.4 mg/dL (ref 8.9–10.3)
Creatinine, Ser: 0.68 mg/dL (ref 0.44–1.00)
GFR calc Af Amer: >60 mL/min (ref >60)
GFR calc non Af Amer: >60 mL/min (ref >60)
Glucose, Bld: 119 mg/dL — ABNORMAL HIGH (ref 70–99)
POTASSIUM: 3.7 mmol/L (ref 3.5–5.1)
Sodium: 138 mmol/L (ref 135–145)
TOTAL PROTEIN: 7 g/dL (ref 6.5–8.1)

## 2018-06-09 LAB — GLUCOSE, CAPILLARY: Glucose-Capillary: 148 mg/dL — ABNORMAL HIGH (ref 70–99)

## 2018-06-09 LAB — TROPONIN I: Troponin I: <0.03 ng/mL (ref <0.03)

## 2018-06-09 LAB — D-DIMER, QUANTITATIVE: D-Dimer, Quant: 0.7 ug/mL-FEU — ABNORMAL HIGH (ref 0.00–0.50)

## 2018-06-09 MED ORDER — ATORVASTATIN CALCIUM 40 MG PO TABS
40.0000 mg | ORAL_TABLET | Freq: Every day | ORAL | Status: DC
  Administered 2018-06-09 – 2018-06-10 (×2): 40 mg via ORAL
  Filled 2018-06-09 (×2): qty 1

## 2018-06-09 MED ORDER — ALBUTEROL SULFATE (2.5 MG/3ML) 0.083% IN NEBU: 2.5000 mg | INHALATION_SOLUTION | RESPIRATORY_TRACT | Status: DC | PRN

## 2018-06-09 MED ORDER — IOPAMIDOL (ISOVUE-370) INJECTION 76%
100.0000 mL | Freq: Once | INTRAVENOUS | Status: AC | PRN
  Administered 2018-06-09: 100 mL via INTRAVENOUS

## 2018-06-09 MED ORDER — HYDROCHLOROTHIAZIDE 25 MG PO TABS
25.0000 mg | ORAL_TABLET | Freq: Every day | ORAL | Status: DC
  Administered 2018-06-10: 25 mg via ORAL
  Filled 2018-06-09: qty 1

## 2018-06-09 MED ORDER — LOSARTAN POTASSIUM 50 MG PO TABS
100.0000 mg | ORAL_TABLET | Freq: Every day | ORAL | Status: DC
  Administered 2018-06-10: 100 mg via ORAL
  Filled 2018-06-09: qty 2

## 2018-06-09 MED ORDER — ACETAMINOPHEN 325 MG PO TABS: 650.0000 mg | ORAL_TABLET | ORAL | Status: DC | PRN

## 2018-06-09 MED ORDER — ASPIRIN 81 MG PO CHEW: 324.0000 mg | CHEWABLE_TABLET | Freq: Once | ORAL | Status: DC

## 2018-06-09 MED ORDER — ASPIRIN EC 81 MG PO TBEC
81.0000 mg | DELAYED_RELEASE_TABLET | Freq: Every day | ORAL | Status: DC
  Administered 2018-06-10: 81 mg via ORAL
  Filled 2018-06-09: qty 1

## 2018-06-09 MED ORDER — LOSARTAN POTASSIUM-HCTZ 100-25 MG PO TABS: 1.0000 | ORAL_TABLET | Freq: Every day | ORAL | Status: DC

## 2018-06-09 MED ORDER — ENOXAPARIN SODIUM 40 MG/0.4ML ~~LOC~~ SOLN
40.0000 mg | SUBCUTANEOUS | Status: DC
  Administered 2018-06-09 – 2018-06-10 (×2): 40 mg via SUBCUTANEOUS
  Filled 2018-06-09 (×2): qty 0.4

## 2018-06-09 MED ORDER — INSULIN ASPART 100 UNIT/ML ~~LOC~~ SOLN
0.0000 [IU] | Freq: Three times a day (TID) | SUBCUTANEOUS | Status: DC
  Administered 2018-06-10 (×2): 1 [IU] via SUBCUTANEOUS

## 2018-06-09 MED ORDER — NITROGLYCERIN 0.4 MG SL SUBL
0.4000 mg | SUBLINGUAL_TABLET | SUBLINGUAL | Status: DC | PRN
  Administered 2018-06-09 (×3): 0.4 mg via SUBLINGUAL
  Filled 2018-06-09 (×2): qty 1

## 2018-06-09 MED ORDER — PANTOPRAZOLE SODIUM 40 MG PO TBEC: 40.0000 mg | DELAYED_RELEASE_TABLET | Freq: Every day | ORAL | Status: DC

## 2018-06-09 MED ORDER — ONDANSETRON HCL 4 MG/2ML IJ SOLN: 4.0000 mg | Freq: Four times a day (QID) | INTRAMUSCULAR | Status: DC | PRN

## 2018-06-09 MED ORDER — IOPAMIDOL (ISOVUE-370) INJECTION 76%
INTRAVENOUS | Status: AC
  Filled 2018-06-09: qty 100

## 2018-06-09 NOTE — ED Triage Notes (Addendum)
Pt presents to ED from home via [facility]. Pt was diagnosed with pneumonia x1 1/2 weeks ago. Pt was prescribed antibiotics during this time. Pt states this morning her chest started hurting on the right side and she has a headache. EMS gave 324 ASA and 1 nitro BP 180/100 HR 78 RR 20 O2 100% CBG 171

## 2018-06-09 NOTE — ED Notes (Signed)
ED TO INPATIENT HANDOFF REPORT  Name/Age/Gender Krystal Vargas 63 y.o. female  Code Status Code Status History    Date Active Date Inactive Code Status Order ID Comments User Context   02/07/2017 2346 02/08/2017 2151 Full Code [id number]  Ivor Costa, MD Inpatient      Home/SNF/Other Home  Chief Complaint Resp Distress  Level of Care/Admitting Diagnosis ED Disposition    ED Disposition Condition [place]: [place] [100100]  Level of Care: Cardiac Telemetry [103]  I expect the patient will be discharged within 24 hours: No (not a candidate for 5C-Observation unit)  Diagnosis: Chest pain [092330]  Admitting Physician: Lenore Cordia [[id number]]  Attending Physician: Lenore Cordia [[id number]]  PT Class (Do Not Modify): Observation [104]  PT Acc Code (Do Not Modify): Observation [10022]       Medical History Past Medical History:  Diagnosis Date  . Asthma   . Cancer ([place])   . Diabetes mellitus   . Fibromyalgia   . High cholesterol   . Hypertension     Allergies No Known Allergies  IV Location/Drains/Wounds Patient Lines/Drains/Airways Status   Active Line/Drains/Airways    Name:   Placement date:   Placement time:   Site:   Days:   Peripheral IV 02/07/17 Right Antecubital   02/07/17    1721    Antecubital   487   Peripheral IV 06/09/18 Right Antecubital   06/09/18    1302    Antecubital   less than 1          Labs/Imaging Results for orders placed or performed during the hospital encounter of 06/09/18 (from the past 48 hour(s))  CBC     Status: None   Collection Time: 06/09/18  1:09 PM  Result Value Ref Range   WBC 6.7 4.0 - 10.5 K/uL   RBC 4.44 3.87 - 5.11 MIL/uL   Hemoglobin 12.8 12.0 - 15.0 g/dL   HCT 41.1 36.0 - 46.0 %   MCV 92.6 80.0 - 100.0 fL   MCH 28.8 26.0 - 34.0 pg   MCHC 31.1 30.0 - 36.0 g/dL   RDW 14.3 11.5 - 15.5 %   Platelets 225 150 - 400 K/uL   nRBC 0.0 0.0 - 0.2 %    Comment: Performed at [facility], [place] [address], [place], [place] [address]  Troponin I - Once     Status: None   Collection Time: 06/09/18  1:09 PM  Result Value Ref Range   Troponin I <0.03 <0.03 ng/mL    Comment: Performed at [facility] [address] [place], [place] [address]  Comprehensive metabolic panel     Status: Abnormal   Collection Time: 06/09/18  1:09 PM  Result Value Ref Range   Sodium 138 135 - 145 mmol/L   Potassium 3.7 3.5 - 5.1 mmol/L   Chloride 100 98 - 111 mmol/L   CO2 26 22 - 32 mmol/L   Glucose, Bld 119 (H) 70 - 99 mg/dL   BUN 16 8 - 23 mg/dL   Creatinine, Ser 0.68 0.44 - 1.00 mg/dL   Calcium 9.4 8.9 - 10.3 mg/dL   Total Protein 7.0 6.5 - 8.1 g/dL   Albumin 4.1 3.5 - 5.0 g/dL   AST 27 15 - 41 U/L   ALT 10 0 - 44 U/L   Alkaline Phosphatase 50 38 - 126 U/L   Total Bilirubin 1.2 0.3 - 1.2 mg/dL  GFR calc non Af Amer >60 >60 mL/min   GFR calc Af Amer >60 >60 mL/min   Anion gap 12 5 - 15    Comment: Performed at [facility] [address], [place], [place] [address]  D-dimer, quantitative     Status: Abnormal   Collection Time: 06/09/18  1:09 PM  Result Value Ref Range   D-Dimer, Quant 0.70 (H) 0.00 - 0.50 ug/mL-FEU    Comment: (NOTE) At the manufacturer cut-off of 0.50 ug/mL FEU, this assay has been documented to exclude PE with a sensitivity and negative predictive value of 97 to 99%.  At this time, this assay has not been approved by the FDA to exclude DVT/VTE. Results should be correlated with clinical presentation. Performed at [facility], [place] [address], [place], [place] [address]    Dg Chest 2 View  Result Date: 06/09/2018 CLINICAL DATA:  Chest pain. EXAM: CHEST - 2 VIEW COMPARISON:  Radiographs of February 07, 2017. FINDINGS: The heart size and mediastinal contours are within normal limits. Both lungs are clear. No pneumothorax or pleural effusion is noted. The visualized skeletal structures are unremarkable. IMPRESSION: No active  cardiopulmonary disease. Electronically Signed   By: Marijo Conception, M.D.   On: 06/09/2018 13:31   Ct Angio Chest Pe W/cm &/or Wo Cm  Result Date: 06/09/2018 CLINICAL DATA:  Positive D-dimer EXAM: CT ANGIOGRAPHY CHEST WITH CONTRAST TECHNIQUE: Multidetector CT imaging of the chest was performed using the standard protocol during bolus administration of intravenous contrast. Multiplanar CT image reconstructions and MIPs were obtained to evaluate the vascular anatomy. CONTRAST:  100 mL Isovue 300 IV COMPARISON:  02/07/2017 FINDINGS: Cardiovascular: Mild cardiomegaly. No filling defects in the pulmonary arteries to suggest pulmonary emboli. Aorta is normal caliber with scattered aortic calcifications. Mediastinum/Nodes: No mediastinal, hilar, or axillary adenopathy. Lungs/Pleura: Mild vascular congestion. No confluent airspace opacities, effusions or overt edema. Upper Abdomen: Imaging into the upper abdomen shows no acute findings. Musculoskeletal: Chest wall soft tissues are unremarkable. No acute bony abnormality. Review of the MIP images confirms the above findings. IMPRESSION: No evidence of pulmonary embolus. Cardiomegaly, mild vascular congestion Electronically Signed   By: Rolm Baptise M.D.   On: 06/09/2018 17:06    Pending Labs Unresulted Labs (From admission, onward)   None      Vitals/Pain Today's Vitals   06/09/18 1445 06/09/18 1500 06/09/18 1515 06/09/18 1925  BP: (!) 143/79 (!) 154/83 (!) 148/99 (!) 162/74  Pulse: (!) 59 (!) 57 (!) 58 88  Resp: 19 (!) 21 19 20   Temp:      SpO2: 96% 94% 96% 96%  Weight:      Height:      PainSc:        Isolation Precautions No active isolations  Medications Medications  nitroGLYCERIN (NITROSTAT) SL tablet 0.4 mg (0.4 mg Sublingual Given 06/09/18 1653)  iopamidol (ISOVUE-370) 76 % injection (has no administration in time range)  iopamidol (ISOVUE-370) 76 % injection 100 mL (100 mLs Intravenous Contrast Given 06/09/18 1612)     Mobility walks

## 2018-06-09 NOTE — H&P (Signed)
History and Physical    Vasti Kaveney AVW:[id number] DOB: Jan 24, 1956 DOA: 06/09/2018  PCP: Heron Nay, PA  Patient coming from: Home  I have personally briefly reviewed patient's old medical records in [facility]  Chief Complaint: Chest tightness  HPI: Krystal Vargas is a 63 y.o. female with medical history significant for T2DM, HTN, HLD, and asthma who presents the ED with chest tightness.  Patient states she was recently diagnosed with pneumonia for which she is been treated with doxycycline.  She has been resting in bed for the last 3 days.  Around 1200 today she went to walk to the mailbox and began to feel a central chest tightness without radiation. She had associated lightheadedness and some diaphoresis.  She reports some nausea without emesis.  She sat down to rest but had continued intermittent symptoms of chest tightness.  She has a history of asthma and tried her rescue inhaler without significant improvement.  She called EMS and was given aspirin 324 mg and sublingual nitroglycerin.  She reports some improvement with the nitroglycerin.  She reports a former history of tobacco and cocaine use, quitting 17 years ago.  She has an occasional glass of wine.  She denies any history of heart disease in her immediate family.  ED Course:  Initial vitals showed BP 141/71, pulse 66, RR 15, temp 98.9 Fahrenheit, SPO2 99% on room air.  Labs are notable for troponin I <0.03 and d-dimer 0.7. EKG showed sinus rhythm, T wave changes inferolateral leads, new from prior.  2 view chest x-ray was without acute cardiopulmonary process.  CTA chest PE study was negative for PE, cardiomegaly noted.  Review of Systems: As per HPI otherwise 10 point review of systems negative.    Past Medical History:  Diagnosis Date  . Asthma   . Cancer (HCC)   . Diabetes mellitus   . Fibromyalgia   . High cholesterol   . Hypertension     Past Surgical History:  Procedure Laterality Date  . TUBAL  LIGATION       reports that she has quit smoking. She has never used smokeless tobacco. She reports current alcohol use. She reports that she does not use drugs.  No Known Allergies  Family History  Problem Relation Age of Onset  . Hypertension Mother   . Diabetes Mellitus II Mother   . Bronchitis Father   . Hypertension Sister   . Diabetes Mellitus II Sister   . Hypertension Brother   . Diabetes Mellitus II Brother      Prior to Admission medications   Medication Sig Start Date End Date Taking? Authorizing Provider  atorvastatin (LIPITOR) 40 MG tablet Take 40 mg by mouth daily. 11/23/16  Yes [provider]  glipiZIDE (GLUCOTROL XL) 5 MG 24 hr tablet Take 5 mg by mouth daily with breakfast.   Yes [provider]  losartan-hydrochlorothiazide (HYZAAR) 100-25 MG tablet Take 1 tablet by mouth daily after breakfast. 11/23/16  Yes [provider]  metFORMIN (GLUMETZA) 500 MG (MOD) 24 hr tablet Take 1,000 mg by mouth 2 (two) times daily with a meal.    Yes [provider]  Multiple Vitamins-Minerals (MULTIVITAMIN GUMMIES WOMENS PO) Take 1 capsule by mouth daily.   Yes [provider]  methocarbamol (ROBAXIN) 500 MG tablet Take 1 tablet (500 mg total) by mouth every 8 (eight) hours as needed for muscle spasms. Patient not taking: Reported on 06/09/2018 02/08/17   Rolly Salter, MD  pantoprazole (PROTONIX) 40  MG tablet Take 1 tablet (40 mg total) by mouth daily. Patient not taking: Reported on 06/09/2018 01/10/16   Cathren Laine, MD    Physical Exam: Vitals:   06/09/18 1445 06/09/18 1500 06/09/18 1515 06/09/18 1925  BP: (!) 143/79 (!) 154/83 (!) 148/99 (!) 162/74  Pulse: (!) 59 (!) 57 (!) 58 88  Resp: 19 (!) 21 19 20   Temp:      SpO2: 96% 94% 96% 96%  Weight:      Height:        Constitutional: Obese woman laying supine in bed, NAD, calm, comfortable Eyes: PERRL, lids and conjunctivae normal ENMT: Mucous membranes are moist. Posterior  pharynx clear of any exudate or lesions.Normal dentition.  Neck: normal, supple, no masses. Respiratory: clear to auscultation bilaterally, no wheezing, no crackles. Normal respiratory effort. No accessory muscle use.  Cardiovascular: Regular rate and rhythm, no murmurs / rubs / gallops. No extremity edema.  Abdomen: Mild epigastric tenderness, no masses palpated. No hepatosplenomegaly. Bowel sounds positive.  Musculoskeletal: no clubbing / cyanosis. No joint deformity upper and lower extremities. Good ROM, no contractures. Normal muscle tone.  Skin: no rashes, lesions, ulcers. No induration Neurologic: CN 2-12 grossly intact. Sensation intact, Strength 5/5 in all 4.  Psychiatric: Normal judgment and insight. Alert and oriented x 3. Normal mood.     Labs on Admission: I have personally reviewed following labs and imaging studies  CBC: Recent Labs  Lab 06/09/18 1309  WBC 6.7  HGB 12.8  HCT 41.1  MCV 92.6  PLT 225   Basic Metabolic Panel: Recent Labs  Lab 06/09/18 1309  NA 138  K 3.7  CL 100  CO2 26  GLUCOSE 119*  BUN 16  CREATININE 0.68  CALCIUM 9.4   GFR: Estimated Creatinine Clearance: 72 mL/min (by C-G formula based on SCr of 0.68 mg/dL). Liver Function Tests: Recent Labs  Lab 06/09/18 1309  AST 27  ALT 10  ALKPHOS 50  BILITOT 1.2  PROT 7.0  ALBUMIN 4.1   No results for input(s): LIPASE, AMYLASE in the last 168 hours. No results for input(s): AMMONIA in the last 168 hours. Coagulation Profile: No results for input(s): INR, PROTIME in the last 168 hours. Cardiac Enzymes: Recent Labs  Lab 06/09/18 1309  TROPONINI <0.03   BNP (last 3 results) No results for input(s): PROBNP in the last 8760 hours. HbA1C: No results for input(s): HGBA1C in the last 72 hours. CBG: No results for input(s): GLUCAP in the last 168 hours. Lipid Profile: No results for input(s): CHOL, HDL, LDLCALC, TRIG, CHOLHDL, LDLDIRECT in the last 72 hours. Thyroid Function Tests: No  results for input(s): TSH, T4TOTAL, FREET4, T3FREE, THYROIDAB in the last 72 hours. Anemia Panel: No results for input(s): VITAMINB12, FOLATE, FERRITIN, TIBC, IRON, RETICCTPCT in the last 72 hours. Urine analysis: No results found for: COLORURINE, APPEARANCEUR, LABSPEC, PHURINE, GLUCOSEU, HGBUR, BILIRUBINUR, KETONESUR, PROTEINUR, UROBILINOGEN, NITRITE, LEUKOCYTESUR  Radiological Exams on Admission: Dg Chest 2 View  Result Date: 06/09/2018 CLINICAL DATA:  Chest pain. EXAM: CHEST - 2 VIEW COMPARISON:  Radiographs of February 07, 2017. FINDINGS: The heart size and mediastinal contours are within normal limits. Both lungs are clear. No pneumothorax or pleural effusion is noted. The visualized skeletal structures are unremarkable. IMPRESSION: No active cardiopulmonary disease. Electronically Signed   By: Lupita Raider, M.D.   On: 06/09/2018 13:31   Ct Angio Chest Pe W/cm &/or Wo Cm  Result Date: 06/09/2018 CLINICAL DATA:  Positive D-dimer EXAM: CT ANGIOGRAPHY CHEST  WITH CONTRAST TECHNIQUE: Multidetector CT imaging of the chest was performed using the standard protocol during bolus administration of intravenous contrast. Multiplanar CT image reconstructions and MIPs were obtained to evaluate the vascular anatomy. CONTRAST:  100 mL Isovue 300 IV COMPARISON:  02/07/2017 FINDINGS: Cardiovascular: Mild cardiomegaly. No filling defects in the pulmonary arteries to suggest pulmonary emboli. Aorta is normal caliber with scattered aortic calcifications. Mediastinum/Nodes: No mediastinal, hilar, or axillary adenopathy. Lungs/Pleura: Mild vascular congestion. No confluent airspace opacities, effusions or overt edema. Upper Abdomen: Imaging into the upper abdomen shows no acute findings. Musculoskeletal: Chest wall soft tissues are unremarkable. No acute bony abnormality. Review of the MIP images confirms the above findings. IMPRESSION: No evidence of pulmonary embolus. Cardiomegaly, mild vascular congestion  Electronically Signed   By: Charlett Nose M.D.   On: 06/09/2018 17:06    EKG: Independently reviewed.  Sinus rhythm, T wave changes inferolateral leads, new from prior.  Assessment/Plan Principal Problem:   Chest pain Active Problems:   Hypertension associated with diabetes (HCC)   Hyperlipidemia associated with type 2 diabetes mellitus (HCC)   Asthma   Diabetes mellitus without complication (HCC)  Evella Faller is a 63 y.o. female with medical history significant for T2DM, HTN, HLD, and asthma who is admitted for chest pain rule out.   Chest pain: Patient mixed features of anginal chest discomfort with exertion.  She also reports indigestion-like symptoms and potential asthma symptoms, although she has no wheezing on my exam.  Initial troponin is negative, however EKG shows new ST changes in the inferolateral leads compared to prior. -Admit to telemetry, repeat EKG in a.m. -Trend troponins -Aspirin 81 mg daily -Continue atorvastatin 40 mg -Echocardiogram -Trial Protonix  Type 2 diabetes: On metformin 1000 mg twice daily as an outpatient. -Sensitive SSI  Hypertension: Currently stable. -Continue losartan-HCTZ  Asthma: No active wheezing on admission. -Albuterol nebulizers as needed  Hyperlipidemia: Continue atorvastatin.  DVT prophylaxis: Lovenox Code Status: Full code Family Communication: None present at bedside admission Disposition Plan: Pending chest pain rule out work-up Consults called: None Admission status: Observation   Darreld Mclean MD [facility] Pager [phone]  If 7PM-7AM, please contact night-coverage [email or web address]  06/09/2018, 7:36 PM

## 2018-06-09 NOTE — ED Provider Notes (Signed)
[place] EMERGENCY DEPARTMENT Provider Note   CSN: [id number] Arrival date & time: 06/09/18  1257     History   Chief Complaint No chief complaint on file.   HPI Krystal Vargas is a 63 y.o. female.  HPI  Krystal Vargas is a 63 y.o. female, with a history of asthma, DM, fibromyalgia, hypercholesterolemia, and HTN, presenting to the ED with chest pain beginning today shortly before 12 PM.  Pain came on at rest, central chest, waxing and waning, 5-9/10, described as a tightness, nonradiating.  Worsens with exertion.  Accompanied by nausea, dizziness, and shortness of breath.  She has not had this sensation before. She has had loose stools for about the past week.  Patient received 324 mg ASA and 1 nitroglycerin with EMS. Denies fever/chills, cough, lower extremity edema or pain, vomiting, abdominal pain, hematochezia/melena, or any other complaints.    Past Medical History:  Diagnosis Date  . Asthma   . Cancer ([place])   . Diabetes mellitus   . Fibromyalgia   . High cholesterol   . Hypertension     Patient Active Problem List   Diagnosis Date Noted  . SOB (shortness of breath) 02/07/2017  . Chest pressure 02/07/2017  . Diabetes mellitus without complication ([place]) 71/69/6789  . GERD (gastroesophageal reflux disease) 02/07/2017  . Endometrial cancer ([place]) 02/07/2017  . Hypokalemia 02/07/2017  . Left arm pain 02/07/2017  . Hypertension   . High cholesterol   . Asthma     Past Surgical History:  Procedure Laterality Date  . TUBAL LIGATION       OB History   No obstetric history on file.      Home Medications    Prior to Admission medications   Medication Sig Start Date End Date Taking? Authorizing Provider  atorvastatin (LIPITOR) 40 MG tablet Take 40 mg by mouth daily. 11/23/16  Yes [provider]  glipiZIDE (GLUCOTROL XL) 5 MG 24 hr tablet Take 5 mg by mouth daily with breakfast.   Yes [provider]    losartan-hydrochlorothiazide (HYZAAR) 100-25 MG tablet Take 1 tablet by mouth daily after breakfast. 11/23/16  Yes [provider]  metFORMIN (GLUMETZA) 500 MG (MOD) 24 hr tablet Take 1,000 mg by mouth 2 (two) times daily with a meal.    Yes [provider]  Multiple Vitamins-Minerals (MULTIVITAMIN GUMMIES WOMENS PO) Take 1 capsule by mouth daily.   Yes [provider]  methocarbamol (ROBAXIN) 500 MG tablet Take 1 tablet (500 mg total) by mouth every 8 (eight) hours as needed for muscle spasms. Patient not taking: Reported on 06/09/2018 02/08/17   Lavina Hamman, MD  pantoprazole (PROTONIX) 40 MG tablet Take 1 tablet (40 mg total) by mouth daily. Patient not taking: Reported on 06/09/2018 01/10/16   Lajean Saver, MD    Family History Family History  Problem Relation Age of Onset  . Hypertension Mother   . Diabetes Mellitus II Mother   . Bronchitis Father   . Hypertension Sister   . Diabetes Mellitus II Sister   . Hypertension Brother   . Diabetes Mellitus II Brother     Social History Social History   Tobacco Use  . Smoking status: Former Research scientist (life sciences)  . Smokeless tobacco: Never Used  Substance Use Topics  . Alcohol use: Yes    Comment: occ  . Drug use: No     Allergies   Patient has no known allergies.   Review of Systems Review of Systems  Constitutional:  Negative for chills and fever.  Respiratory: Positive for shortness of breath. Negative for cough.   Cardiovascular: Positive for chest pain. Negative for palpitations and leg swelling.  Gastrointestinal: Positive for diarrhea and nausea. Negative for abdominal pain, blood in stool, constipation and vomiting.  Genitourinary: Negative for dysuria and hematuria.  Musculoskeletal: Negative for back pain.  Neurological: Negative for weakness and numbness.  All other systems reviewed and are negative.    Physical Exam Updated Vital Signs Pulse 80   Temp 98.9 F (37.2 C)   Resp (!) 26   Ht 5'  2" (1.575 m)   Wt 85.3 kg   LMP 05/16/2012   SpO2 96%   BMI 34.39 kg/m   Physical Exam Vitals signs and nursing note reviewed.  Constitutional:      General: She is not in acute distress.    Appearance: She is well-developed. She is not diaphoretic.  HENT:     Head: Normocephalic and atraumatic.     Mouth/Throat:     Mouth: Mucous membranes are moist.     Pharynx: Oropharynx is clear.  Eyes:     Conjunctiva/sclera: Conjunctivae normal.  Neck:     Musculoskeletal: Neck supple.  Cardiovascular:     Rate and Rhythm: Normal rate and regular rhythm.     Pulses: Normal pulses.          Radial pulses are 2+ on the right side and 2+ on the left side.       Posterior tibial pulses are 2+ on the right side and 2+ on the left side.     Heart sounds: Normal heart sounds.  Pulmonary:     Effort: Pulmonary effort is normal. No respiratory distress.     Breath sounds: Normal breath sounds.  Chest:     Chest wall: No tenderness.  Abdominal:     Palpations: Abdomen is soft.     Tenderness: There is no abdominal tenderness. There is no guarding.  Musculoskeletal:     Right lower leg: No edema.     Left lower leg: No edema.  Lymphadenopathy:     Cervical: No cervical adenopathy.  Skin:    General: Skin is warm and dry.  Neurological:     Mental Status: She is alert.  Psychiatric:        Mood and Affect: Mood and affect normal.        Speech: Speech normal.        Behavior: Behavior normal.      ED Treatments / Results  Labs (all labs ordered are listed, but only abnormal results are displayed) Labs Reviewed  COMPREHENSIVE METABOLIC PANEL - Abnormal; Notable for the following components:      Result Value   Glucose, Bld 119 (*)    All other components within normal limits  D-DIMER, QUANTITATIVE (NOT AT [facility]) - Abnormal; Notable for the following components:   D-Dimer, Quant 0.70 (*)    All other components within normal limits  CBC  TROPONIN I    EKG EKG  Interpretation  Date/Time:  Tuesday June 09 2018 13:03:22 EST Ventricular Rate:  70 PR Interval:    QRS Duration: 87 QT Interval:  380 QTC Calculation: 410 R Axis:   67 Text Interpretation:  Sinus rhythm Borderline T abnormalities, diffuse leads T wave inversions are similar to previous tracing Confirmed by Theotis Burrow [phone]) on 06/09/2018 1:39:55 PM   Radiology Dg Chest 2 View  Result Date: 06/09/2018 CLINICAL DATA:  Chest pain. EXAM: CHEST -  2 VIEW COMPARISON:  Radiographs of February 07, 2017. FINDINGS: The heart size and mediastinal contours are within normal limits. Both lungs are clear. No pneumothorax or pleural effusion is noted. The visualized skeletal structures are unremarkable. IMPRESSION: No active cardiopulmonary disease. Electronically Signed   By: Marijo Conception, M.D.   On: 06/09/2018 13:31    Procedures Procedures (including critical care time)  Medications Ordered in ED Medications  nitroGLYCERIN (NITROSTAT) SL tablet 0.4 mg (0.4 mg Sublingual Given 06/09/18 1350)  iopamidol (ISOVUE-370) 76 % injection 100 mL (has no administration in time range)  iopamidol (ISOVUE-370) 76 % injection (has no administration in time range)     Initial Impression / Assessment and Plan / ED Course  I have reviewed the triage vital signs and the nursing notes.  Pertinent labs & imaging results that were available during my care of the patient were reviewed by me and considered in my medical decision making (see chart for details).     Patient presents with chest pain.  Suspicious for possible unstable angina.  HEART score of 5. No acute abnormalities on EKG.  Initial troponin negative.  D-dimer mildly positive.  Findings and plan of care discussed with Theotis Burrow, MD. Dr. Rex Kras personally evaluated and examined this patient.  End of shift patient care handoff report given to Irena Cords, PA-C. Plan: CTA of the chest pending.  If negative, continue work-up for  unstable angina, including consultation with cardiology and probable admission.   Final Clinical Impressions(s) / ED Diagnoses   Final diagnoses:  None    ED Discharge Orders    None       Layla Maw 06/09/18 1649    Little, Wenda Overland, MD 06/13/18 [phone]

## 2018-06-10 ENCOUNTER — Inpatient Hospital Stay (HOSPITAL_BASED_OUTPATIENT_CLINIC_OR_DEPARTMENT_OTHER): Payer: Medicare Other

## 2018-06-10 ENCOUNTER — Observation Stay (HOSPITAL_BASED_OUTPATIENT_CLINIC_OR_DEPARTMENT_OTHER): Payer: Medicare Other

## 2018-06-10 DIAGNOSIS — R079 Chest pain, unspecified: Secondary | ICD-10-CM

## 2018-06-10 DIAGNOSIS — E785 Hyperlipidemia, unspecified: Secondary | ICD-10-CM

## 2018-06-10 DIAGNOSIS — I10 Essential (primary) hypertension: Secondary | ICD-10-CM

## 2018-06-10 DIAGNOSIS — R0789 Other chest pain: Secondary | ICD-10-CM

## 2018-06-10 DIAGNOSIS — R071 Chest pain on breathing: Secondary | ICD-10-CM

## 2018-06-10 DIAGNOSIS — J45909 Unspecified asthma, uncomplicated: Secondary | ICD-10-CM

## 2018-06-10 DIAGNOSIS — E119 Type 2 diabetes mellitus without complications: Secondary | ICD-10-CM | POA: Diagnosis not present

## 2018-06-10 DIAGNOSIS — E1159 Type 2 diabetes mellitus with other circulatory complications: Secondary | ICD-10-CM

## 2018-06-10 DIAGNOSIS — E669 Obesity, unspecified: Secondary | ICD-10-CM

## 2018-06-10 DIAGNOSIS — E1169 Type 2 diabetes mellitus with other specified complication: Secondary | ICD-10-CM

## 2018-06-10 LAB — GLUCOSE, CAPILLARY
GLUCOSE-CAPILLARY: 73 mg/dL (ref 70–99)
GLUCOSE-CAPILLARY: 146 mg/dL — AB (ref 70–99)
Glucose-Capillary: 64 mg/dL — ABNORMAL LOW (ref 70–99)
Glucose-Capillary: 136 mg/dL — ABNORMAL HIGH (ref 70–99)

## 2018-06-10 LAB — NM MYOCAR MULTI W/SPECT W/WALL MOTION / EF
Estimated workload: 1 METS
Exercise duration (min): 0 min
Exercise duration (sec): 0 s
MPHR: 156 {beats}/min
Peak HR: 109 {beats}/min
Percent HR: 69 %
Rest HR: 67 {beats}/min

## 2018-06-10 LAB — HIV ANTIBODY (ROUTINE TESTING W REFLEX): HIV Screen 4th Generation wRfx: NONREACTIVE

## 2018-06-10 LAB — ECHOCARDIOGRAM COMPLETE
Height: 62 in
Weight: 3005.31 oz

## 2018-06-10 LAB — TROPONIN I
Troponin I: <0.03 ng/mL (ref <0.03)
Troponin I: <0.03 ng/mL (ref <0.03)

## 2018-06-10 MED ORDER — TECHNETIUM TC 99M TETROFOSMIN IV KIT
10.0000 | PACK | Freq: Once | INTRAVENOUS | Status: AC | PRN
  Administered 2018-06-10: 10 via INTRAVENOUS

## 2018-06-10 MED ORDER — NITROGLYCERIN 0.4 MG SL SUBL: 0.4000 mg | SUBLINGUAL_TABLET | SUBLINGUAL | 0 refills | Status: AC | PRN

## 2018-06-10 MED ORDER — ASPIRIN 81 MG PO TBEC: 81.0000 mg | DELAYED_RELEASE_TABLET | Freq: Every day | ORAL | 0 refills | Status: DC

## 2018-06-10 MED ORDER — REGADENOSON 0.4 MG/5ML IV SOLN
0.4000 mg | Freq: Once | INTRAVENOUS | Status: AC
  Administered 2018-06-10: 0.4 mg via INTRAVENOUS
  Filled 2018-06-10: qty 5

## 2018-06-10 MED ORDER — TECHNETIUM TC 99M TETROFOSMIN IV KIT
30.0000 | PACK | Freq: Once | INTRAVENOUS | Status: AC | PRN
  Administered 2018-06-10: 30 via INTRAVENOUS

## 2018-06-10 MED ORDER — DOXYCYCLINE HYCLATE 100 MG PO TABS
100.0000 mg | ORAL_TABLET | Freq: Two times a day (BID) | ORAL | Status: DC
  Administered 2018-06-10 (×2): 100 mg via ORAL
  Filled 2018-06-10 (×2): qty 1

## 2018-06-10 MED ORDER — REGADENOSON 0.4 MG/5ML IV SOLN
INTRAVENOUS | Status: AC
  Filled 2018-06-10: qty 5

## 2018-06-10 MED ORDER — DEXTROSE 50 % IV SOLN
25.0000 mL | Freq: Once | INTRAVENOUS | Status: AC
  Administered 2018-06-10: 25 mL via INTRAVENOUS
  Filled 2018-06-10: qty 50

## 2018-06-10 NOTE — Progress Notes (Signed)
   Patient presented for a nuclear stress test today. No immediate complications. Stress imaging is pending at this time.   Preliminary EKG findings may be listed in the chart, but the stress test result will not be finalized until perfusion imaging is complete.  Darreld Mclean, PA-C 06/10/2018 2:25 PM

## 2018-06-10 NOTE — Discharge Summary (Signed)
Chest pain  Diabetes mellitus type 2 with hyperglycemia: Overnight patient had one episode of hypoglycemia with blood sugars low as 64.  Patient was given dextrose with improvement of blood sugars.                                                                                                                                                                    Krystal Vargas, is a 63 y.o. female  DOB February 17, 1956  MRN [id number].  Admission date:  06/09/2018  Admitting Physician  Charlsie Quest, MD  Discharge Date:  06/10/2018   Primary MD  Heron Nay, PA  Recommendations for primary care physician for things to follow:       Discharge Diagnosis   Principal Problem:   Chest pain Active Problems:   Hypertension associated with diabetes (HCC)   Hyperlipidemia associated with type 2 diabetes mellitus (HCC)   Asthma   Diabetes mellitus without complication (HCC)   Obesity (BMI 30-39.9)      Past Medical History:  Diagnosis Date  . Asthma   . Cancer (HCC)   . Diabetes mellitus   . Fibromyalgia   . High cholesterol   . Hypertension     Past Surgical History:  Procedure Laterality Date  . TUBAL LIGATION         HPI  from the history and physical done on the day of admission:  Krystal Vargas is a 63 y.o. female with medical history significant for T2DM, HTN, HLD, and asthma who presents the ED with chest tightness.  Patient states she was recently diagnosed with pneumonia for which she is been treated with doxycycline.  She has been resting in bed for the last 3 days.  Around 1200 today she went to walk to the mailbox and began to feel a central chest tightness without radiation. She had associated lightheadedness and some diaphoresis.  She reports some nausea without emesis.  She sat down to rest but had continued intermittent symptoms of chest tightness.  She has a history of asthma and tried her rescue inhaler without significant improvement.  She called EMS and was given aspirin  324 mg and sublingual nitroglycerin.  She reports some improvement with the nitroglycerin.  She reports a former history of tobacco and cocaine use, quitting 17 years ago.  She has an occasional glass of wine.  She denies any history of heart disease in her immediate family.  ED Course:  Initial vitals showed BP 141/71, pulse 66, RR 15, temp 98.9 Fahrenheit, SPO2 99% on room air.  Labs are notable for troponin I <0.03 and d-dimer 0.7. EKG showed sinus rhythm, T wave changes inferolateral leads, new from prior.  2 view chest x-ray was without acute cardiopulmonary process.  CTA chest  PE study was negative for pulmonary embolus, but cardiomegaly noted.   Hospital Course:  1.  Atypical chest pain: Due to patient symptoms and multiple cardiac risk factors.  Cardiac troponins negative, but EKG with diffuse T wave inversions.  D-dimer was noted to be elevated but no signs of a pulmonary embolus on CT.  Echocardiogram revealed EF 60 to 65% with normal diastolic filling pattern.  Cardiology was consulted and agreed with Myoview stress test.  Myoview revealed a normal study with low risk.  She was recommended to continue on aspirin and statin.  Cardiology also questioned pleurisy as a possible cause of symptoms.  Patient was also advised that symptoms could have been related to possible pill esophagitis that can be seen with doxycycline.  2.  Essential hypertension: Blood pressures noted to be elevated as high as 174/83.  She is continued on her home blood pressure medications of losartan and hydrochlorothiazide.  3.  Hyperlipidemia: Last lipid panel from 02/08/2017 noted total cholesterol 114, HDL 51, LDL 46, and triglycerides 84.  Patient was recommended continue on current dose of atorvastatin.  4.  Diabetes mellitus type 2 with hypoglycemia: Last hemoglobin A1c 6.3 on 02/08/2017.  On admission oral hypoglycemic agents were held and she was placed on a sliding scale insulin.  She was noted to be  hypoglycemic with blood sugar initially as low as 64 as she been made n.p.o.  Patient was given amp of D50 with resolution of symptoms.  Denies any significant issues with low blood sugars at home.  Advised to continue home medications at discharge.  5.  History of asthma.  Stable.  Patient was given albuterol nebs during her hospitalization as needed.  She is recommended to continue on home nebulizer treatments.  6.  Recent pneumonia.  She had recently been placed on doxycycline to complete a 10-day course after being diagnosed with pneumonia.  No signs of pneumonia noted on CT scan.  Patient wanted to complete course of doxycycline.  7.  Obesity: BMI 34.35. Patient counseled on need of weight loss.  Follow UP  Follow-up Information    [facility] Follow up.   Specialty:  Cardiology Why:  Follow up as needed.  Contact information: [address] [place] [address] [phone]       Heron Nay, PA. Schedule an appointment as soon as possible for a visit in 1 week(s).   Specialty:  Physician Assistant Contact information: [address] [place] [address] [phone]            Consults obtained: Cardiology Dr. Rennis Golden  Discharge Condition: Stable  Diet and Activity recommendation: See Discharge Instructions below  Discharge Instructions    Discharge instructions   Complete by:  As directed    Follow with Primary MD Heron Nay, PA within 1-2  weeks from being discharged from the hospital.  The work-up did not show any clear signs of of a blockage to your heart.  We recommend that you set up a follow-up with [facility] within 3 to 4 weeks.  ( we routinely change or add medications that can affect your baseline labs and fluid status, therefore we recommend that you get the mentioned basic workup next visit with your PCP, your PCP may decide not to get them or add new tests based on their clinical decision)  Activity: As  tolerated   Disposition Home    Diet: Heart healthy and carb modified diet  Special Instructions: If  you have smoked or chewed Tobacco  in the last 2 yrs please stop smoking, stop any regular Alcohol  and or any Recreational drug use.  On your next visit with your primary care physician please Get Medicines reviewed and adjusted.  Please request your Heron Nay, PA to go over all Hospital Tests and Procedure/Radiological results at the follow up, please get all Hospital records sent to your Prim MD by signing hospital release before you go home.  If you experience worsening of your admission symptoms, develop shortness of breath, life threatening emergency, suicidal or homicidal thoughts you must seek medical attention immediately by calling 911 or calling your MD immediately  if symptoms less severe.  You Must read complete instructions/literature along with all the possible adverse reactions/side effects for all the Medicines you take and that have been prescribed to you. Take any new Medicines after you have completely understood and accpet all the possible adverse reactions/side effects.   Do not drive, operate heavy machinery, perform activities at heights, swimming or participation in water activities or provide baby sitting services if your were admitted for syncope or siezures until you have seen by Primary MD or a Neurologist and advised to do so again.  Do not drive when taking Pain medications.  Do not take more than prescribed Pain, Sleep and Anxiety Medications  Wear Seat belts while driving.   Please note  You were cared for by a hospitalist during your hospital stay. If you have any questions about your discharge medications or the care you received while you were in the hospital after you are discharged, you can call the unit and asked to speak with the hospitalist on call if the hospitalist that took care of you is not available. Once you are discharged, your primary  care physician will handle any further medical issues. Please note that NO REFILLS for any discharge medications will be authorized once you are discharged, as it is imperative that you return to your primary care physician (or establish a relationship with a primary care physician if you do not have one) for your aftercare needs so that they can reassess your need for medications and monitor your lab values.   Increase activity slowly   Complete by:  As directed         Discharge Medications     Allergies as of 06/10/2018   No Known Allergies     Medication List    STOP taking these medications   methocarbamol 500 MG tablet Commonly known as:  ROBAXIN     TAKE these medications   aspirin 81 MG EC tablet Take 1 tablet (81 mg total) by mouth daily.   atorvastatin 40 MG tablet Commonly known as:  LIPITOR Take 40 mg by mouth daily.   glipiZIDE 5 MG 24 hr tablet Commonly known as:  GLUCOTROL XL Take 5 mg by mouth daily with breakfast.   losartan-hydrochlorothiazide 100-25 MG tablet Commonly known as:  HYZAAR Take 1 tablet by mouth daily after breakfast.   metFORMIN 500 MG (MOD) 24 hr tablet Commonly known as:  GLUMETZA Take 1,000 mg by mouth 2 (two) times daily with a meal.   MULTIVITAMIN GUMMIES WOMENS PO Take 1 capsule by mouth daily.   nitroGLYCERIN 0.4 MG SL tablet Commonly known as:  NITROSTAT Place 1 tablet (0.4 mg total) under the tongue every 5 (five) minutes as needed for chest pain.   pantoprazole 40 MG tablet Commonly known as:  PROTONIX Take 1 tablet (  40 mg total) by mouth daily.       Major procedures and Radiology Reports - PLEASE review detailed and final reports for all details, in brief -   Echocardiogram 06/10/2018: IMPRESSIONS  1. The left ventricle appears to be normal in size, has normal wall thickness 60-65% ejection fraction Spectral Doppler shows normal pattern of diastolic filling.  2. The right ventricle has normal size and normal  systolic function.  3. Normal left atrial size.  4. Normal right atrial size.  5. Mitral valve regurgitation is trivial by color flow Doppler.  6. The mitral valve normal in structure and function.  7. Normal tricuspid valve.  8. Aortic valve normal.  9. No atrial level shunt detected by color flow Doppler.  Dg Chest 2 View  Result Date: 06/09/2018 CLINICAL DATA:  Chest pain. EXAM: CHEST - 2 VIEW COMPARISON:  Radiographs of February 07, 2017. FINDINGS: The heart size and mediastinal contours are within normal limits. Both lungs are clear. No pneumothorax or pleural effusion is noted. The visualized skeletal structures are unremarkable. IMPRESSION: No active cardiopulmonary disease. Electronically Signed   By: Lupita Raider, M.D.   On: 06/09/2018 13:31   Ct Angio Chest Pe W/cm &/or Wo Cm  Result Date: 06/09/2018 CLINICAL DATA:  Positive D-dimer EXAM: CT ANGIOGRAPHY CHEST WITH CONTRAST TECHNIQUE: Multidetector CT imaging of the chest was performed using the standard protocol during bolus administration of intravenous contrast. Multiplanar CT image reconstructions and MIPs were obtained to evaluate the vascular anatomy. CONTRAST:  100 mL Isovue 300 IV COMPARISON:  02/07/2017 FINDINGS: Cardiovascular: Mild cardiomegaly. No filling defects in the pulmonary arteries to suggest pulmonary emboli. Aorta is normal caliber with scattered aortic calcifications. Mediastinum/Nodes: No mediastinal, hilar, or axillary adenopathy. Lungs/Pleura: Mild vascular congestion. No confluent airspace opacities, effusions or overt edema. Upper Abdomen: Imaging into the upper abdomen shows no acute findings. Musculoskeletal: Chest wall soft tissues are unremarkable. No acute bony abnormality. Review of the MIP images confirms the above findings. IMPRESSION: No evidence of pulmonary embolus. Cardiomegaly, mild vascular congestion Electronically Signed   By: Charlett Nose M.D.   On: 06/09/2018 17:06   Nm Myocar Multi W/spect  W/wall Motion / Ef  Result Date: 06/10/2018  There was no ST segment deviation noted during stress.  Nuclear stress EF: 56%.  No ischemia identified, no infarct.  The study is normal.  This is a low risk study.  Donato Schultz, MD    Micro Results    No results found for this or any previous visit (from the past 240 hour(s)).     Today   Subjective    Laketta Rudel reports that she is some discomfort with taking a deep breath in, but has not had any recurrence of chest pain..    Objective   Blood pressure (!) 174/83, pulse 63, temperature 98.2 F (36.8 C), temperature source Oral, resp. rate 18, height 5\' 2"  (1.575 m), weight 85.2 kg, last menstrual period 05/16/2012, SpO2 96 %.   Intake/Output Summary (Last 24 hours) at 06/11/2018 0258 Last data filed at 06/10/2018 1704 Gross per 24 hour  Intake 240 ml  Output -  Net 240 ml    Exam  Constitutional: Obese female NAD, calm, comfortable Eyes: PERRL, lids and conjunctivae normal ENMT: Mucous membranes are moist. Posterior pharynx clear of any exudate or lesions. Normal dentition.  Neck: normal, supple, no masses, no thyromegaly Respiratory: clear to auscultation bilaterally, no wheezing, no crackles. Normal respiratory effort. No accessory muscle use.  Cardiovascular: Regular rate and rhythm, no murmurs / rubs / gallops. No extremity edema. 2+ pedal pulses. No carotid bruits.  Abdomen: no tenderness, no masses palpated. No hepatosplenomegaly. Bowel sounds positive.  Musculoskeletal: no clubbing / cyanosis. No joint deformity upper and lower extremities. Good ROM, no contractures. Normal muscle tone.  Skin: no rashes, lesions, ulcers. No induration Neurologic: CN 2-12 grossly intact. Sensation intact, DTR normal. Strength 5/5 in all 4.  Psychiatric: Normal judgment and insight. Alert and oriented x 3. Normal mood.    Data Review   CBC w Diff:  Lab Results  Component Value Date   WBC 6.7 06/09/2018   HGB 12.8 06/09/2018     HCT 41.1 06/09/2018   PLT 225 06/09/2018   LYMPHOPCT 43 02/08/2017   MONOPCT 7 02/08/2017   EOSPCT 2 02/08/2017   BASOPCT 0 02/08/2017    CMP:  Lab Results  Component Value Date   NA 138 06/09/2018   K 3.7 06/09/2018   CL 100 06/09/2018   CO2 26 06/09/2018   BUN 16 06/09/2018   CREATININE 0.68 06/09/2018   PROT 7.0 06/09/2018   ALBUMIN 4.1 06/09/2018   BILITOT 1.2 06/09/2018   ALKPHOS 50 06/09/2018   AST 27 06/09/2018   ALT 10 06/09/2018  .   Total Time in preparing paper work, data evaluation and todays exam - 35 minutes  Clydie Braun M.D on 06/11/2018 at 2:58 AM  [facility]   Office  [phone]

## 2018-06-10 NOTE — Progress Notes (Signed)
Echo shows normal systolic and diastolic function. Her stress test was negative for ischemia. No further cardiac work-up for chest pain is recommended. Thanks for allowing [place] to participate in her care.  [facility] will sign off.   Medication Recommendations:  none Other recommendations (labs, testing, etc):  none Follow up as an outpatient:  Follow-up as needed  Pixie Casino, MD, [facility], [place] Director of the [facility]  [facility] Diplomate of the American Board of Clinical Lipidology Attending Cardiologist  Direct Dial: [phone]  Fax: [phone]  Website:  [email or web address]

## 2018-06-10 NOTE — Consult Note (Addendum)
Cardiology Consult    Patient ID: Krystal Vargas MRN: [id number], DOB/AGE: 1956-04-05   Admit date: 06/09/2018 Date of Consult: 06/10/2018  Primary Physician: Heron Nay, PA Primary Cardiologist: New (Dr. Rennis Golden). Requesting Provider: Clydie Braun, MD.  Patient Profile    Krystal Vargas is a 63 y.o. female with a history of hypertension, hyperlipidemia, diabetes mellitus, asthma, and endometrial cancer, who is being seen today for the evaluation of chest pain at the request of Dr. Katrinka Blazing.  History of Present Illness    Krystal Vargas is a 63 year old female with a history of hypertension, hyperlipidemia, and diabetes mellitus. She has no known cardiac history.   Patient presented to the [facility] ED via EMS yesterday for evaluation of chest pain. Patient reports onset of substernal non-radiating chest tightness yesterday while walking to the mailbox with associated sweating, shortness of breath, lightheadedness, and dizziness. No associated palpitations, nausea, or vomiting. She initially attributed the chest tightness to her asthmas as she occasionally has chest pain when she is having an asthma attack; however, pain did not improve with breathing treatment like it usually does. Patient had some dyspnea on exertion last week but does not usually have any exertional chest pain or shortness of breath. Patient states she thought she was going to pass out. Patient wad diagnosed with pneumonia 1-1.5 weeks ago and has been on an antibiotic. She has been very fatigued with this illness. She also reports some right upper quadrant/epigastric pain and back pain. Chest pain persisted so patient called 911. EMS gave Aspirin 325mg  and sublingual Nitroglycerin.   In the ED: EKG showed sinus rhythm with diffuse T wave inversions in inferior and anterolateral leads. I-stat troponin negative x2. Chest x-ray showed no acute findings. D-dimer positive. Chest CTA showed no evidence of pulmonary embolism. CBC  unremarkable. Na 138, K 3.7, Glucose 119, SCr 0.68. Chest pain eventually improved with Nitroglycerin. Patient was admitted for further evaluation.  Currently, patient denies any chest pain. She continues to have some right upper quadrant/epigastric pain as well as some back pain.   Patient report previous tobacco and cocaine use but states she quit both 17 years ago. She occasionally will drink 1-2 glasses of wine. Patient's daughter had a "heart attack" at the age of 42 but patient denies any other known family history of heart disease.   Past Medical History   Past Medical History:  Diagnosis Date  . Asthma   . Cancer (HCC)   . Diabetes mellitus   . Fibromyalgia   . High cholesterol   . Hypertension     Past Surgical History:  Procedure Laterality Date  . TUBAL LIGATION       Allergies  No Known Allergies  Inpatient Medications    . aspirin EC  81 mg Oral Daily  . atorvastatin  40 mg Oral Daily  . enoxaparin (LOVENOX) injection  40 mg Subcutaneous Q24H  . hydrochlorothiazide  25 mg Oral Daily  . insulin aspart  0-9 Units Subcutaneous TID WC  . losartan  100 mg Oral Daily    Family History    Family History  Problem Relation Age of Onset  . Hypertension Mother   . Diabetes Mellitus II Mother   . Bronchitis Father   . Hypertension Sister   . Diabetes Mellitus II Sister   . Hypertension Brother   . Diabetes Mellitus II Brother    She indicated that the status of her mother is unknown. She indicated that the status  of her father is unknown. She indicated that the status of her sister is unknown. She indicated that the status of her brother is unknown.   Social History    Social History   Socioeconomic History  . Marital status: Married    Spouse name: Not on file  . Number of children: Not on file  . Years of education: Not on file  . Highest education level: Not on file  Occupational History  . Not on file  Social Needs  . Financial resource strain: Not  on file  . Food insecurity:    Worry: Not on file    Inability: Not on file  . Transportation needs:    Medical: Not on file    Non-medical: Not on file  Tobacco Use  . Smoking status: Former Games developer  . Smokeless tobacco: Never Used  Substance and Sexual Activity  . Alcohol use: Yes    Comment: occ  . Drug use: No  . Sexual activity: Not on file  Lifestyle  . Physical activity:    Days per week: Not on file    Minutes per session: Not on file  . Stress: Not on file  Relationships  . Social connections:    Talks on phone: Not on file    Gets together: Not on file    Attends religious service: Not on file    Active member of club or organization: Not on file    Attends meetings of clubs or organizations: Not on file    Relationship status: Not on file  . Intimate partner violence:    Fear of current or ex partner: Not on file    Emotionally abused: Not on file    Physically abused: Not on file    Forced sexual activity: Not on file  Other Topics Concern  . Not on file  Social History Narrative  . Not on file     Review of Systems    Review of Systems  Constitutional: Positive for malaise/fatigue. Negative for fever.  HENT: Positive for congestion.   Respiratory: Positive for shortness of breath. Negative for cough and hemoptysis.   Cardiovascular: Positive for chest pain and palpitations. Negative for orthopnea, leg swelling and PND.  Gastrointestinal: Positive for abdominal pain. Negative for blood in stool, nausea and vomiting.  Genitourinary: Negative for hematuria.  Musculoskeletal: Positive for back pain and myalgias.  Neurological: Positive for dizziness and weakness. Negative for loss of consciousness.  Endo/Heme/Allergies: Does not bruise/bleed easily.  Psychiatric/Behavioral: Negative for substance abuse (former tobacco and cocaine use).    Physical Exam    Blood pressure (!) 161/82, pulse 67, temperature 97.6 F (36.4 C), temperature source Oral, resp.  rate 18, height 5\' 2"  (1.575 m), weight 85.2 kg, last menstrual period 05/16/2012, SpO2 95 %.  General: 63 y.o. [place] female resting comfortably in no acute distress. Pleasant and cooperative. HEENT: Normal  Neck: Supple. No carotid bruits or JVD appreciated. Lungs: No increased work of breathing. Clear to auscultation bilaterally. No wheezes, rhonchi, or rales. Heart: Borderline bradycardic with regular rhythm. Distinct S1 and S2. No murmurs, gallops, or rubs.  Abdomen: Soft, non-distended, and mildly tender to palpation of right upper quadrant/epigastric area. Bowel sounds present.  Extremities: No lower extremity edema. Radial and distal pedal pulses 2+ and equal bilaterally. Skin: Warm and dry. Neuro: Alert and oriented x3. No focal deficits. Moves all extremities spontaneously. Psych: Normal affect.  Labs    Troponin [facility] of Care Test) No results for input(s):  TROPIPOC in the last 72 hours. Recent Labs    06/09/18 1309 06/09/18 2054 06/10/18 0117  TROPONINI <0.03 <0.03 <0.03   Lab Results  Component Value Date   WBC 6.7 06/09/2018   HGB 12.8 06/09/2018   HCT 41.1 06/09/2018   MCV 92.6 06/09/2018   PLT 225 06/09/2018    Recent Labs  Lab 06/09/18 1309  NA 138  K 3.7  CL 100  CO2 26  BUN 16  CREATININE 0.68  CALCIUM 9.4  PROT 7.0  BILITOT 1.2  ALKPHOS 50  ALT 10  AST 27  GLUCOSE 119*   Lab Results  Component Value Date   CHOL 114 02/08/2017   HDL 51 02/08/2017   LDLCALC 46 02/08/2017   TRIG 84 02/08/2017   Lab Results  Component Value Date   DDIMER 0.70 (H) 06/09/2018     Radiology Studies    Dg Chest 2 View  Result Date: 06/09/2018 CLINICAL DATA:  Chest pain. EXAM: CHEST - 2 VIEW COMPARISON:  Radiographs of February 07, 2017. FINDINGS: The heart size and mediastinal contours are within normal limits. Both lungs are clear. No pneumothorax or pleural effusion is noted. The visualized skeletal structures are unremarkable. IMPRESSION: No  active cardiopulmonary disease. Electronically Signed   By: Lupita Raider, M.D.   On: 06/09/2018 13:31   Ct Angio Chest Pe W/cm &/or Wo Cm  Result Date: 06/09/2018 CLINICAL DATA:  Positive D-dimer EXAM: CT ANGIOGRAPHY CHEST WITH CONTRAST TECHNIQUE: Multidetector CT imaging of the chest was performed using the standard protocol during bolus administration of intravenous contrast. Multiplanar CT image reconstructions and MIPs were obtained to evaluate the vascular anatomy. CONTRAST:  100 mL Isovue 300 IV COMPARISON:  02/07/2017 FINDINGS: Cardiovascular: Mild cardiomegaly. No filling defects in the pulmonary arteries to suggest pulmonary emboli. Aorta is normal caliber with scattered aortic calcifications. Mediastinum/Nodes: No mediastinal, hilar, or axillary adenopathy. Lungs/Pleura: Mild vascular congestion. No confluent airspace opacities, effusions or overt edema. Upper Abdomen: Imaging into the upper abdomen shows no acute findings. Musculoskeletal: Chest wall soft tissues are unremarkable. No acute bony abnormality. Review of the MIP images confirms the above findings. IMPRESSION: No evidence of pulmonary embolus. Cardiomegaly, mild vascular congestion Electronically Signed   By: Charlett Nose M.D.   On: 06/09/2018 17:06    EKG     EKG: EKG was personally reviewed and demonstrates: Sinus rhythm with nonspecific ST/T changes with T wave inversion in lead III (seen on prior tracing in 2017).   Telemetry: Telemetry was personally reviewed and demonstrates: Sinus rhythm with heart rates ranging from high 40's to 120's but mostly in the 50's to 90's range.  Cardiac Imaging    Echocardiogram 02/08/2017: Study Conclusions: - Left ventricle: Wall thickness was increased in a pattern of mild   LVH. Systolic function was normal. The estimated ejection   fraction was in the range of 60% to 65%. Wall motion was normal;   there were no regional wall motion abnormalities. Doppler   parameters are  consistent with abnormal left ventricular   relaxation (grade 1 diastolic dysfunction). - Pulmonary arteries: PA peak pressure: 33 mm Hg (S).  Assessment & Plan    Chest Pain - Patient presents with exertional chest pain with associated sweating, shortness of breath, lightheadedness, and dizziness. Patient recently diagnosed with pneumonia and is on an antibiotic. - Initial EKG in the ED showed diffused T wave inversion in inferior and anterolateral leads. - Repeat EKG this morning showed improvement with non-specific T wave  changes and T wave inversions in lead III. However, this was also seen on previous tracing in 2017. - Troponin negative x3.  - D-dimer elevated at 0.70. Chest CTA showed no evidence of pulmonary embolism. - Echo ordered by primary team. - Patient currently chest pain free. - Patient has multiple CV risk factors (HTN, HLD, DM, obesity, race). Consider Myoview for further risk stratification. Will discuss with MD.  Hypertension - Most recent BP 161/82. Although patient has not had medications today. - Continue home Losartan 100mg  daily. - Continue home HCTZ 25mg  daily.  Hyperlipidemia - Continue home statin.  Diabetes Mellitus - Management per primary team.  Signed, Corrin Parker, PA-C 06/10/2018, 7:24 AM  For questions or updates, please contact   Please consult [email or web address] for contact info under Cardiology/STEMI.

## 2018-06-10 NOTE — Care Management Obs Status (Signed)
[place] NOTIFICATION   Patient Details  Name: Krystal Vargas MRN: [id number] Date of Birth: May 23, 1955   Medicare Observation Status Notification Given:  Yes    Carles Collet, RN 06/10/2018, 10:19 AM

## 2018-06-10 NOTE — Progress Notes (Signed)
Pt alert and oriented x4. Skin warm and dry. Pt verbalized understanding of discharge instructions. Pt escorted out front lobby via wheelchair for pickup from family.

## 2018-06-10 NOTE — Progress Notes (Signed)
  Echocardiogram 2D Echocardiogram has been performed.  Krystal Vargas 06/10/2018, 1:40 PM

## 2018-06-11 DIAGNOSIS — E669 Obesity, unspecified: Secondary | ICD-10-CM | POA: Diagnosis present

## 2019-02-04 ENCOUNTER — Emergency Department (HOSPITAL_COMMUNITY): Payer: Medicare Other

## 2019-02-04 ENCOUNTER — Other Ambulatory Visit: Payer: Self-pay

## 2019-02-04 ENCOUNTER — Encounter (HOSPITAL_BASED_OUTPATIENT_CLINIC_OR_DEPARTMENT_OTHER): Payer: Self-pay

## 2019-02-04 ENCOUNTER — Observation Stay (HOSPITAL_COMMUNITY): Payer: Medicare Other

## 2019-02-04 ENCOUNTER — Inpatient Hospital Stay (HOSPITAL_BASED_OUTPATIENT_CLINIC_OR_DEPARTMENT_OTHER)
Admission: EM | Admit: 2019-02-04 | Discharge: 2019-02-05 | DRG: 065 | Disposition: A | Discharge status: Home / Self Care | Payer: Medicare Other | Attending: Internal Medicine | Admitting: Internal Medicine

## 2019-02-04 DIAGNOSIS — Z87891 Personal history of nicotine dependence: Secondary | ICD-10-CM

## 2019-02-04 DIAGNOSIS — Z20828 Contact with and (suspected) exposure to other viral communicable diseases: Secondary | ICD-10-CM | POA: Diagnosis not present

## 2019-02-04 DIAGNOSIS — Z6836 Body mass index (BMI) 36.0-36.9, adult: Secondary | ICD-10-CM | POA: Diagnosis not present

## 2019-02-04 DIAGNOSIS — Z881 Allergy status to other antibiotic agents status: Secondary | ICD-10-CM

## 2019-02-04 DIAGNOSIS — E119 Type 2 diabetes mellitus without complications: Secondary | ICD-10-CM | POA: Diagnosis not present

## 2019-02-04 DIAGNOSIS — I639 Cerebral infarction, unspecified: Secondary | ICD-10-CM | POA: Diagnosis present

## 2019-02-04 DIAGNOSIS — Z90710 Acquired absence of both cervix and uterus: Secondary | ICD-10-CM

## 2019-02-04 DIAGNOSIS — E7800 Pure hypercholesterolemia, unspecified: Secondary | ICD-10-CM | POA: Diagnosis present

## 2019-02-04 DIAGNOSIS — I6381 Other cerebral infarction due to occlusion or stenosis of small artery: Secondary | ICD-10-CM | POA: Diagnosis not present

## 2019-02-04 DIAGNOSIS — R29701 NIHSS score 1: Secondary | ICD-10-CM | POA: Diagnosis not present

## 2019-02-04 DIAGNOSIS — Z7984 Long term (current) use of oral hypoglycemic drugs: Secondary | ICD-10-CM

## 2019-02-04 DIAGNOSIS — M797 Fibromyalgia: Secondary | ICD-10-CM | POA: Diagnosis present

## 2019-02-04 DIAGNOSIS — E669 Obesity, unspecified: Secondary | ICD-10-CM | POA: Diagnosis present

## 2019-02-04 DIAGNOSIS — I680 Cerebral amyloid angiopathy: Secondary | ICD-10-CM | POA: Diagnosis present

## 2019-02-04 DIAGNOSIS — Z833 Family history of diabetes mellitus: Secondary | ICD-10-CM | POA: Diagnosis not present

## 2019-02-04 DIAGNOSIS — Z8541 Personal history of malignant neoplasm of cervix uteri: Secondary | ICD-10-CM

## 2019-02-04 DIAGNOSIS — R51 Headache: Secondary | ICD-10-CM | POA: Diagnosis not present

## 2019-02-04 DIAGNOSIS — E785 Hyperlipidemia, unspecified: Secondary | ICD-10-CM | POA: Diagnosis not present

## 2019-02-04 DIAGNOSIS — E876 Hypokalemia: Secondary | ICD-10-CM | POA: Diagnosis present

## 2019-02-04 DIAGNOSIS — I10 Essential (primary) hypertension: Secondary | ICD-10-CM | POA: Diagnosis present

## 2019-02-04 DIAGNOSIS — Z79899 Other long term (current) drug therapy: Secondary | ICD-10-CM | POA: Diagnosis not present

## 2019-02-04 DIAGNOSIS — Z8701 Personal history of pneumonia (recurrent): Secondary | ICD-10-CM | POA: Diagnosis not present

## 2019-02-04 DIAGNOSIS — Z7982 Long term (current) use of aspirin: Secondary | ICD-10-CM

## 2019-02-04 DIAGNOSIS — Z8249 Family history of ischemic heart disease and other diseases of the circulatory system: Secondary | ICD-10-CM

## 2019-02-04 DIAGNOSIS — E854 Organ-limited amyloidosis: Secondary | ICD-10-CM | POA: Diagnosis present

## 2019-02-04 DIAGNOSIS — Z825 Family history of asthma and other chronic lower respiratory diseases: Secondary | ICD-10-CM

## 2019-02-04 DIAGNOSIS — G44229 Chronic tension-type headache, not intractable: Secondary | ICD-10-CM | POA: Diagnosis present

## 2019-02-04 DIAGNOSIS — E1151 Type 2 diabetes mellitus with diabetic peripheral angiopathy without gangrene: Secondary | ICD-10-CM | POA: Diagnosis not present

## 2019-02-04 DIAGNOSIS — J45909 Unspecified asthma, uncomplicated: Secondary | ICD-10-CM | POA: Diagnosis not present

## 2019-02-04 LAB — CBC WITH DIFFERENTIAL/PLATELET
Abs Immature Granulocytes: 0.02 10*3/uL (ref 0.00–0.07)
Basophils Absolute: 0 10*3/uL (ref 0.0–0.1)
Basophils Relative: 1 %
Eosinophils Absolute: 0.1 10*3/uL (ref 0.0–0.5)
Eosinophils Relative: 1 %
HCT: 38.4 % (ref 36.0–46.0)
Hemoglobin: 12.2 g/dL (ref 12.0–15.0)
Immature Granulocytes: 0 %
Lymphocytes Relative: 33 %
Lymphs Abs: 2.3 10*3/uL (ref 0.7–4.0)
MCH: 29.7 pg (ref 26.0–34.0)
MCHC: 31.8 g/dL (ref 30.0–36.0)
MCV: 93.4 fL (ref 80.0–100.0)
Monocytes Absolute: 0.6 10*3/uL (ref 0.1–1.0)
Monocytes Relative: 8 %
Neutro Abs: 4 10*3/uL (ref 1.7–7.7)
Neutrophils Relative %: 57 %
Platelets: 301 10*3/uL (ref 150–400)
RBC: 4.11 MIL/uL (ref 3.87–5.11)
RDW: 14.1 % (ref 11.5–15.5)
WBC: 7.1 10*3/uL (ref 4.0–10.5)
nRBC: 0 % (ref 0.0–0.2)

## 2019-02-04 LAB — SARS CORONAVIRUS 2 BY RT PCR (HOSPITAL ORDER, PERFORMED IN ~~LOC~~ HOSPITAL LAB): SARS Coronavirus 2: NEGATIVE

## 2019-02-04 LAB — BASIC METABOLIC PANEL
Anion gap: 10 (ref 5–15)
BUN: 17 mg/dL (ref 8–23)
CO2: 27 mmol/L (ref 22–32)
Calcium: 9.3 mg/dL (ref 8.9–10.3)
Chloride: 100 mmol/L (ref 98–111)
Creatinine, Ser: 0.82 mg/dL (ref 0.44–1.00)
GFR calc Af Amer: >60 mL/min (ref >60)
GFR calc non Af Amer: >60 mL/min (ref >60)
Glucose, Bld: 141 mg/dL — ABNORMAL HIGH (ref 70–99)
Potassium: 3.4 mmol/L — ABNORMAL LOW (ref 3.5–5.1)
Sodium: 137 mmol/L (ref 135–145)

## 2019-02-04 MED ORDER — POTASSIUM CHLORIDE CRYS ER 20 MEQ PO TBCR
20.0000 meq | EXTENDED_RELEASE_TABLET | Freq: Once | ORAL | Status: AC
  Administered 2019-02-05: 20 meq via ORAL
  Filled 2019-02-04: qty 1

## 2019-02-04 MED ORDER — IOHEXOL 350 MG/ML SOLN
75.0000 mL | Freq: Once | INTRAVENOUS | Status: AC | PRN
  Administered 2019-02-04: 75 mL via INTRAVENOUS

## 2019-02-04 MED ORDER — SODIUM CHLORIDE 0.9 % IV SOLN
INTRAVENOUS | Status: DC
  Administered 2019-02-05: 01:00:00 via INTRAVENOUS

## 2019-02-04 MED ORDER — ACETAMINOPHEN 325 MG PO TABS: 650.0000 mg | ORAL_TABLET | ORAL | Status: DC | PRN

## 2019-02-04 MED ORDER — ATORVASTATIN CALCIUM 80 MG PO TABS
80.0000 mg | ORAL_TABLET | Freq: Every day | ORAL | Status: DC
  Administered 2019-02-05 (×2): 80 mg via ORAL
  Filled 2019-02-04 (×2): qty 1

## 2019-02-04 MED ORDER — SENNOSIDES-DOCUSATE SODIUM 8.6-50 MG PO TABS: 1.0000 | ORAL_TABLET | Freq: Every evening | ORAL | Status: DC | PRN

## 2019-02-04 MED ORDER — ACETAMINOPHEN 160 MG/5ML PO SOLN: 650.0000 mg | ORAL | Status: DC | PRN

## 2019-02-04 MED ORDER — ASPIRIN 81 MG PO CHEW
81.0000 mg | CHEWABLE_TABLET | Freq: Every day | ORAL | Status: DC
  Administered 2019-02-05: 81 mg via ORAL
  Filled 2019-02-04: qty 1

## 2019-02-04 MED ORDER — INSULIN ASPART 100 UNIT/ML ~~LOC~~ SOLN: 0.0000 [IU] | Freq: Every day | SUBCUTANEOUS | Status: DC

## 2019-02-04 MED ORDER — ENOXAPARIN SODIUM 40 MG/0.4ML ~~LOC~~ SOLN
40.0000 mg | SUBCUTANEOUS | Status: DC
  Administered 2019-02-05: 40 mg via SUBCUTANEOUS
  Filled 2019-02-04: qty 0.4

## 2019-02-04 MED ORDER — STROKE: EARLY STAGES OF RECOVERY BOOK
Freq: Once | Status: AC
  Administered 2019-02-05: 01:00:00
  Filled 2019-02-04: qty 1

## 2019-02-04 MED ORDER — INSULIN ASPART 100 UNIT/ML ~~LOC~~ SOLN
0.0000 [IU] | Freq: Three times a day (TID) | SUBCUTANEOUS | Status: DC
  Administered 2019-02-05: 11:00:00 2 [IU] via SUBCUTANEOUS

## 2019-02-04 MED ORDER — ACETAMINOPHEN 650 MG RE SUPP: 650.0000 mg | RECTAL | Status: DC | PRN

## 2019-02-04 NOTE — Consult Note (Addendum)
Neurology Consultation  Reason for Consult: Stroke Referring Physician: Dr. Tyrone Nine  CC: Left facial numbness, headache  History is obtained from: Patient, chart  HPI: Krystal Vargas is a 63 y.o. female past medical history of cervical cancer status post hysterectomy 2018, diabetes, hypertension, hyperlipidemia, fibromyalgia presented to the emergency room for evaluation of strokelike symptoms. She has been following with her primary care provider for headaches that have been persistent for the past 2 to 3 months.  The outpatient primary care provider ordered a head CT which showed an age-indeterminate stroke, further follow-up for which she was sent to the emergency room for further evaluation and further testing with an MRI.  An MRI of the brain was done that showed a stroke as documented below. She describes that she was in her usual of health last Sunday, 01/31/2019 when she had some tingling and numbness on the left side of her face.  This lasted about 1520 minutes and then resolved.  It has happened since multiple times and is currently also tingly and numb on the left side of the lower face and neck.  She has headaches that are off and on present.  Did not complain of headache today.  Did not complain of any visual symptoms today.  Denies any focal weakness.  Says she is clumsy at baseline but did not notice any extra clumsiness than her baseline.  Some left knee pain, which is baseline but no weakness in legs. No difficulty swallowing.  Has history of asthma, for which a CT of the chest was also ordered as an outpatient.  No complaints of memory issues.  LKW: 4 days ago tpa given?: no, outside the window Premorbid modified Rankin scale (mRS): 1-some difficulty walking due to knee pain  ROS:ROS was performed and is negative except as noted in the HPI.   Past Medical History:  Diagnosis Date  . Asthma   . Cancer ([place])   . Diabetes mellitus   . Fibromyalgia   . High cholesterol   .  Hypertension     Family History  Problem Relation Age of Onset  . Hypertension Mother   . Diabetes Mellitus II Mother   . Bronchitis Father   . Hypertension Sister   . Diabetes Mellitus II Sister   . Hypertension Brother   . Diabetes Mellitus II Brother    Social History:   reports that she has quit smoking. She has never used smokeless tobacco. She reports current alcohol use. She reports that she does not use drugs.  Medications No current facility-administered medications for this encounter.   Current Outpatient Medications:  .  aspirin EC 81 MG EC tablet, Take 1 tablet (81 mg total) by mouth daily., Disp: 30 tablet, Rfl: 0 .  atorvastatin (LIPITOR) 40 MG tablet, Take 40 mg by mouth daily., Disp: , Rfl:  .  glipiZIDE (GLUCOTROL XL) 5 MG 24 hr tablet, Take 5 mg by mouth daily with breakfast., Disp: , Rfl:  .  losartan-hydrochlorothiazide (HYZAAR) 100-25 MG tablet, Take 1 tablet by mouth daily after breakfast., Disp: , Rfl:  .  metFORMIN (GLUMETZA) 500 MG (MOD) 24 hr tablet, Take 1,000 mg by mouth 2 (two) times daily with a meal. , Disp: , Rfl:  .  Multiple Vitamins-Minerals (MULTIVITAMIN GUMMIES WOMENS PO), Take 1 capsule by mouth daily., Disp: , Rfl:  .  nitroGLYCERIN (NITROSTAT) 0.4 MG SL tablet, Place 1 tablet (0.4 mg total) under the tongue every 5 (five) minutes as needed for chest pain., Disp: 20  tablet, Rfl: 0 .  pantoprazole (PROTONIX) 40 MG tablet, Take 1 tablet (40 mg total) by mouth daily. (Patient not taking: Reported on 06/09/2018), Disp: 20 tablet, Rfl: 0  Exam: Current vital signs: BP (!) 182/89   Pulse 77   Temp 99.7 F (37.6 C) (Oral)   Resp 18   Ht 5\' 2"  (1.575 m)   Wt 90.3 kg   LMP 05/16/2012   SpO2 97%   BMI 36.40 kg/m  Vital signs in last 24 hours: Temp:  [99.7 F (37.6 C)] 99.7 F (37.6 C) (09/24 1439) Pulse Rate:  [65-86] 77 (09/24 1838) Resp:  [18-20] 18 (09/24 1740) BP: (172-185)/(75-89) 182/89 (09/24 1836) SpO2:  [97 %-98 %] 97 % (09/24  1838) Weight:  [90.3 kg] 90.3 kg (09/24 1440) General: Awake alert in no distress HEENT: Cephalic atraumatic dry mucous membranes Lungs: Clear to auscultation Cardiovascular: Regular rate rhythm Abdomen: Soft nondistended nontender Extremities warm well perfused Neurological exam Awake alert oriented x3 Speech is non-dysarthric Imitation fluency comprehension intact Cranial nerves: Pupils equal round react light, extraocular movements intact, visual fields full, face symmetric, facial sensation diminished to light touch on the left V2 V3 and left neck, tongue and palate midline. Motor exam: No vertical drift in any of the 4 extremities Sensory exam: Other than the face, unremarkable. Coordination: No dysmetria NIH stroke scale-1 for sensory   Labs I have reviewed labs in epic and the results pertinent to this consultation are:  CBC    Component Value Date/Time   WBC 7.1 02/04/2019 1537   RBC 4.11 02/04/2019 1537   HGB 12.2 02/04/2019 1537   HCT 38.4 02/04/2019 1537   PLT 301 02/04/2019 1537   MCV 93.4 02/04/2019 1537   MCH 29.7 02/04/2019 1537   MCHC 31.8 02/04/2019 1537   RDW 14.1 02/04/2019 1537   LYMPHSABS 2.3 02/04/2019 1537   MONOABS 0.6 02/04/2019 1537   EOSABS 0.1 02/04/2019 1537   BASOSABS 0.0 02/04/2019 1537    CMP     Component Value Date/Time   NA 137 02/04/2019 1537   K 3.4 (L) 02/04/2019 1537   CL 100 02/04/2019 1537   CO2 27 02/04/2019 1537   GLUCOSE 141 (H) 02/04/2019 1537   BUN 17 02/04/2019 1537   CREATININE 0.82 02/04/2019 1537   CALCIUM 9.3 02/04/2019 1537   PROT 7.0 06/09/2018 1309   ALBUMIN 4.1 06/09/2018 1309   AST 27 06/09/2018 1309   ALT 10 06/09/2018 1309   ALKPHOS 50 06/09/2018 1309   BILITOT 1.2 06/09/2018 1309   GFRNONAA >60 02/04/2019 1537   GFRAA >60 02/04/2019 1537    Imaging I have reviewed the images obtained:  CT-scan of the brain-did not have the images to review  MRI examination of the brain- 10 mm acute infarct in  the right frontal lobe subcortical white matter.  Moderate chronic small vessel disease.  Multiple chronic microhemorrhages within the cerebral hemispheres but also involving the basal ganglia/thalami and brainstem  Assessment: 63 year old with above past medical history with 4-day history of left-sided facial numbness noted to have attending with acute infarct in the right frontal lobe subcortical white matter.  There is also evidence of multiple chronic microhemorrhages in the cerebral hemispheres, likely chronic hypertensive but amyloid angiopathy listed as differential by radiology.   Impression: Acute ischemic stroke-likely small vessel Chronic microhemorrhages-likely from chronic hypertension.  Consider amyloid angiopathy given history of headaches-other differentials to consider would be vasculitis.  Recommendations: Admit for stroke work-up Telemetry CTA head and neck  2D echo A1c Lipid panel Aspirin 81 for now and daily Atorvastatin 80 Frequent neurochecks Allow for permissive hypertension for a day or so and then normalize blood pressure prior to discharge-goal 140/90 upon discharge. PT, OT, speech therapy Stroke swallow screen N.p.o. until cleared by stroke swallow screen or formal swallow evaluation Defer work-up for amyloid/vasculitis after vessel imaging and basic labs become available. Stroke team to follow with you.   -- Amie Portland, MD [facility] Pager: [phone] If 7pm to 7am, please call on call as listed on AMION.

## 2019-02-04 NOTE — ED Notes (Signed)
Pt states having CT scan of her head yesterday and nurse called to tell her to come into ED. No headache, blurry vision, dizziness. Headache intermittent since Nov 2019. A&Ox4. No current pain. No N/V/D. NAD.

## 2019-02-04 NOTE — ED Triage Notes (Signed)
Pt c/o HA since Nov 2019-states she saw PCP and had CT head yesterday-she was called and advised she had a stroke and go to ED-pt NAD-steady gait

## 2019-02-04 NOTE — ED Provider Notes (Signed)
Patient is a transfer from [facility] for MRI of the brain.  She had outpatient CT scan done which showed an age-indeterminate thalamic stroke.  She states she is been having intermittent headaches since November 2019.  On Sunday she began having left-sided facial tingling sensation which has been persistent since that time. Physical Exam  BP (!) 182/89   Pulse 77   Temp 99.7 F (37.6 C) (Oral)   Resp 18   Ht 5\' 2"  (1.575 m)   Wt 90.3 kg   LMP 05/16/2012   SpO2 97%   BMI 36.40 kg/m   Physical Exam Vitals signs and nursing note reviewed.  Constitutional:      Appearance: She is well-developed.  HENT:     Head: Normocephalic and atraumatic.  Eyes:     Conjunctiva/sclera: Conjunctivae normal.  Cardiovascular:     Rate and Rhythm: Normal rate and regular rhythm.  Pulmonary:     Effort: Pulmonary effort is normal.     Breath sounds: Normal breath sounds.  Abdominal:     Palpations: Abdomen is soft.  Skin:    General: Skin is warm.  Neurological:     Mental Status: She is alert.     Comments: Mental Status:  Alert, oriented, thought content appropriate, able to give a coherent history. Speech fluent without evidence of aphasia. Able to follow 2 step commands without difficulty.  Cranial Nerves:  II:  Peripheral visual fields grossly normal, pupils equal, round, reactive to light III,IV, VI: ptosis not present, extra-ocular motions intact bilaterally  V,VII: smile symmetric, facial light touch sensation decreased on left VIII: hearing grossly normal to voice  X: uvula elevates symmetrically  XI: bilateral shoulder shrug symmetric and strong XII: midline tongue extension without fassiculations Motor:  Normal tone. 5/5 strength in upper and lower extremities bilaterally including strong and equal grip strength and dorsiflexion/plantar flexion Sensory: grossly normal in all extremities.  Cerebellar: normal finger-to-nose with bilateral upper extremities Gait: normal  gait and balance CV: distal pulses palpable throughout    Psychiatric:        Behavior: Behavior normal.     ED Course/Procedures   Clinical Course as of Feb 04 2032  Thu Feb 04, 2019  1957 Acute infarct 13mm right frontal lobe per radiology.   [JR]  2000 Dr. Rory Percy with neurology to see patient. Requests medical admission.   [JR]    Clinical Course User Index [JR] Nga Rabon, [place] N, PA-C    Procedures  Results for orders placed or performed during the hospital encounter of 02/04/19  CBC with Differential  Result Value Ref Range   WBC 7.1 4.0 - 10.5 K/uL   RBC 4.11 3.87 - 5.11 MIL/uL   Hemoglobin 12.2 12.0 - 15.0 g/dL   HCT 38.4 36.0 - 46.0 %   MCV 93.4 80.0 - 100.0 fL   MCH 29.7 26.0 - 34.0 pg   MCHC 31.8 30.0 - 36.0 g/dL   RDW 14.1 11.5 - 15.5 %   Platelets 301 150 - 400 K/uL   nRBC 0.0 0.0 - 0.2 %   Neutrophils Relative % 57 %   Neutro Abs 4.0 1.7 - 7.7 K/uL   Lymphocytes Relative 33 %   Lymphs Abs 2.3 0.7 - 4.0 K/uL   Monocytes Relative 8 %   Monocytes Absolute 0.6 0.1 - 1.0 K/uL   Eosinophils Relative 1 %   Eosinophils Absolute 0.1 0.0 - 0.5 K/uL   Basophils Relative 1 %   Basophils Absolute 0.0  0.0 - 0.1 K/uL   Immature Granulocytes 0 %   Abs Immature Granulocytes 0.02 0.00 - 0.07 K/uL  Basic metabolic panel  Result Value Ref Range   Sodium 137 135 - 145 mmol/L   Potassium 3.4 (L) 3.5 - 5.1 mmol/L   Chloride 100 98 - 111 mmol/L   CO2 27 22 - 32 mmol/L   Glucose, Bld 141 (H) 70 - 99 mg/dL   BUN 17 8 - 23 mg/dL   Creatinine, Ser 0.82 0.44 - 1.00 mg/dL   Calcium 9.3 8.9 - 10.3 mg/dL   GFR calc non Af Amer >60 >60 mL/min   GFR calc Af Amer >60 >60 mL/min   Anion gap 10 5 - 15   Mr Brain Wo Contrast  Result Date: 02/04/2019 CLINICAL DATA:  Headache, chronic, normal neuro exam. Persistent cough and fatigue and headache. Head CT showing age-indeterminate lacunar infarct. EXAM: MRI HEAD WITHOUT CONTRAST TECHNIQUE: Multiplanar, multiecho pulse sequences of  the brain and surrounding structures were obtained without intravenous contrast. COMPARISON:  Head CT 12/24/2009 FINDINGS: Brain: 10 mm acute lacunar infarct within the right frontal lobe subcortical white matter. Associated T2/FLAIR hyperintensity at this site. No evidence of intracranial mass. No midline shift or extra-axial fluid collection. There are multiple chronic microhemorrhages within the bilateral cerebral hemispheres, but also within the bilateral basal ganglia/thalami and within the brainstem. Moderate scattered and confluent T2/FLAIR hyperintensity within the cerebral white matter is nonspecific, but consistent with chronic small vessel ischemic disease. Small chronic right thalamic lacunar infarct. Cerebral volume is normal for age. Vascular: Flow voids maintained within the proximal large arterial vessels. Skull and upper cervical spine: No focal marrow lesion. Incompletely assessed cervical spondylosis. Sinuses/Orbits: Visualized orbits demonstrate no acute abnormality. Minimal scattered paranasal sinus mucosal thickening at the imaged levels. No significant mastoid effusion. Findings of acute right frontal lobe subcortical white matter infarct were called by telephone at the time of interpretation on 02/04/2019 at 7:56 pm to provider Dr. Francia Greaves, who verbally acknowledged these results. IMPRESSION: 1. 10 mm acute infarct within the right frontal lobe subcortical white matter. 2. Moderate chronic small vessel ischemic disease. Chronic right thalamic lacunar infarct. 3. Multiple chronic microhemorrhages within the cerebral hemispheres, but also involving the bilateral basal ganglia/thalami and brainstem. Findings are suggestive of cerebral amyloid angiopathy, possibly with superimposed chronic hypertensive microangiopathy. Electronically Signed   By: Kellie Simmering   On: 02/04/2019 19:58     MDM  Patient sent from [facility] for [facility] with abnormal outpatient CT of her head concerning for  possible stroke.  Neuro deficits today reveal some decreased sensation to left face though otherwise no other deficits.  MRI today is consistent with acute to 10 mm infarct within the right frontal lobe subcortical white matter.  This was discussed with Dr. Rory Percy with neurology who evaluated pt and recommends medical admission.      Micajah Dennin, [place] N, PA-C 02/04/19 2033    Valarie Merino, MD 02/04/19 2245

## 2019-02-04 NOTE — ED Provider Notes (Addendum)
[facility] Provider Note   CSN: [id number] Arrival date & time: 02/04/19  1426     History   Chief Complaint Chief Complaint  Patient presents with   Headache    HPI Krystal Vargas is a 63 y.o. female.     63 yo F with a chief complaint of a CT scan that showed that she had an age-indeterminate stroke.  Patient had seen her family doctor for persistent cough and fatigue and headache.  She told me she is had a headache for at least the past couple months if not longer.  The family physician had scheduled an outpatient CT scan of her head and her chest.  She had apparently had recurrent pneumonias since January.  CT scan was read as a possible thalamic stroke.  She then was called by her doctor to come to the emergency department for evaluation.  She denies difficulty with speech or swallowing denies unilateral numbness or weakness.  Denies dizziness difficulty with ambulation.  She has been having a left-sided headache.  She has had some tingling to the left side of her face for at least the past couple days.  She had episode back in November where she felt very dizzy but that has resolved.  No prior history of stroke.  The history is provided by the patient.  Headache Pain location:  L parietal Quality:  Dull Associated symptoms: numbness (tingling to left face)   Associated symptoms: no congestion, no dizziness, no fever, no myalgias, no nausea and no vomiting   Illness Severity:  Moderate Onset quality:  Gradual Duration:  2 months Timing:  Constant Progression:  Worsening Chronicity:  New Associated symptoms: headaches   Associated symptoms: no chest pain, no congestion, no fever, no myalgias, no nausea, no rhinorrhea, no shortness of breath, no vomiting and no wheezing     Past Medical History:  Diagnosis Date   Asthma    Cancer ([place])    Diabetes mellitus    Fibromyalgia    High cholesterol    Hypertension     Patient Active  Problem List   Diagnosis Date Noted   Acute ischemic stroke ([place]) 02/04/2019   Obesity (BMI 30-39.9) 06/11/2018   Chest pain 06/09/2018   SOB (shortness of breath) 02/07/2017   Chest pressure 02/07/2017   Diabetes mellitus without complication ([place]) [id number]   GERD (gastroesophageal reflux disease) 02/07/2017   Endometrial cancer ([place]) 02/07/2017   Hypokalemia 02/07/2017   Left arm pain 02/07/2017   Hypertension    Hyperlipidemia associated with type 2 diabetes mellitus ([facility])    Asthma     Past Surgical History:  Procedure Laterality Date   TUBAL LIGATION       OB History   No obstetric history on file.      Home Medications    Prior to Admission medications   Medication Sig Start Date End Date Taking? Authorizing Provider  aspirin EC 81 MG EC tablet Take 1 tablet (81 mg total) by mouth daily. 06/11/18  Yes Smith, Eustaquio Boyden A, MD  atorvastatin (LIPITOR) 40 MG tablet Take 40 mg by mouth daily. 11/23/16  Yes [provider]  glipiZIDE (GLUCOTROL XL) 5 MG 24 hr tablet Take 5 mg by mouth daily with breakfast.   Yes [provider]  losartan-hydrochlorothiazide (HYZAAR) 100-25 MG tablet Take 1 tablet by mouth daily after breakfast. 11/23/16  Yes [provider]  metFORMIN (GLUMETZA) 500 MG (MOD) 24 hr tablet Take 1,000 mg by mouth 2 (  two) times daily with a meal.    Yes [provider]  Multiple Vitamins-Minerals (MULTIVITAMIN GUMMIES WOMENS PO) Take 1 capsule by mouth daily.   Yes [provider]  omeprazole (PRILOSEC) 40 MG capsule Take 40 mg by mouth daily.   Yes [provider]  Polyethyl Glycol-Propyl Glycol (SYSTANE OP) Place 1 drop into both eyes daily as needed (Dry eyes).   Yes [provider]  nitroGLYCERIN (NITROSTAT) 0.4 MG SL tablet Place 1 tablet (0.4 mg total) under the tongue every 5 (five) minutes as needed for chest pain. 06/10/18   Norval Morton, MD    Family History Family History   Problem Relation Age of Onset   Hypertension Mother    Diabetes Mellitus II Mother    Bronchitis Father    Hypertension Sister    Diabetes Mellitus II Sister    Hypertension Brother    Diabetes Mellitus II Brother     Social History Social History   Tobacco Use   Smoking status: Former Smoker   Smokeless tobacco: Never Used  Substance Use Topics   Alcohol use: Yes    Comment: occ   Drug use: No     Allergies   Doxycycline   Review of Systems Review of Systems  Constitutional: Negative for chills and fever.  HENT: Negative for congestion and rhinorrhea.   Eyes: Negative for redness and visual disturbance.  Respiratory: Negative for shortness of breath and wheezing.   Cardiovascular: Negative for chest pain and palpitations.  Gastrointestinal: Negative for nausea and vomiting.  Genitourinary: Negative for dysuria and urgency.  Musculoskeletal: Negative for arthralgias and myalgias.  Skin: Negative for pallor and wound.  Neurological: Positive for numbness (tingling to left face) and headaches. Negative for dizziness.     Physical Exam Updated Vital Signs BP (!) 162/90    Pulse 64    Temp 99.7 F (37.6 C) (Oral)    Resp 18    Ht 5\' 2"  (1.575 m)    Wt 90.3 kg    LMP 05/16/2012    SpO2 96%    BMI 36.40 kg/m   Physical Exam Vitals signs and nursing note reviewed.  Constitutional:      General: She is not in acute distress.    Appearance: She is well-developed. She is not diaphoretic.  HENT:     Head: Normocephalic and atraumatic.  Eyes:     Pupils: Pupils are equal, round, and reactive to light.  Neck:     Musculoskeletal: Normal range of motion and neck supple.  Cardiovascular:     Rate and Rhythm: Normal rate and regular rhythm.     Heart sounds: No murmur. No friction rub. No gallop.   Pulmonary:     Effort: Pulmonary effort is normal.     Breath sounds: No wheezing or rales.  Abdominal:     General: There is no distension.     Palpations:  Abdomen is soft.     Tenderness: There is no abdominal tenderness.  Musculoskeletal:        General: No tenderness.  Skin:    General: Skin is warm and dry.  Neurological:     Mental Status: She is alert and oriented to person, place, and time.     Comments: Subjective decreased sensation to the left side of the face.  Otherwise benign neuro exam.   Psychiatric:        Behavior: Behavior normal.      ED Treatments / Results  Labs (  all labs ordered are listed, but only abnormal results are displayed) Labs Reviewed  BASIC METABOLIC PANEL - Abnormal; Notable for the following components:      Result Value   Potassium 3.4 (*)    Glucose, Bld 141 (*)    All other components within normal limits  SARS CORONAVIRUS 2 (HOSPITAL ORDER, [facility])  CBC WITH DIFFERENTIAL/PLATELET    EKG None  Radiology Mr Brain Wo Contrast  Result Date: 02/04/2019 CLINICAL DATA:  Headache, chronic, normal neuro exam. Persistent cough and fatigue and headache. Head CT showing age-indeterminate lacunar infarct. EXAM: MRI HEAD WITHOUT CONTRAST TECHNIQUE: Multiplanar, multiecho pulse sequences of the brain and surrounding structures were obtained without intravenous contrast. COMPARISON:  Head CT 12/24/2009 FINDINGS: Brain: 10 mm acute lacunar infarct within the right frontal lobe subcortical white matter. Associated T2/FLAIR hyperintensity at this site. No evidence of intracranial mass. No midline shift or extra-axial fluid collection. There are multiple chronic microhemorrhages within the bilateral cerebral hemispheres, but also within the bilateral basal ganglia/thalami and within the brainstem. Moderate scattered and confluent T2/FLAIR hyperintensity within the cerebral white matter is nonspecific, but consistent with chronic small vessel ischemic disease. Small chronic right thalamic lacunar infarct. Cerebral volume is normal for age. Vascular: Flow voids maintained within the  proximal large arterial vessels. Skull and upper cervical spine: No focal marrow lesion. Incompletely assessed cervical spondylosis. Sinuses/Orbits: Visualized orbits demonstrate no acute abnormality. Minimal scattered paranasal sinus mucosal thickening at the imaged levels. No significant mastoid effusion. Findings of acute right frontal lobe subcortical white matter infarct were called by telephone at the time of interpretation on 02/04/2019 at 7:56 pm to provider Dr. Francia Greaves, who verbally acknowledged these results. IMPRESSION: 1. 10 mm acute infarct within the right frontal lobe subcortical white matter. 2. Moderate chronic small vessel ischemic disease. Chronic right thalamic lacunar infarct. 3. Multiple chronic microhemorrhages within the cerebral hemispheres, but also involving the bilateral basal ganglia/thalami and brainstem. Findings are suggestive of cerebral amyloid angiopathy, possibly with superimposed chronic hypertensive microangiopathy. Electronically Signed   By: Kellie Simmering   On: 02/04/2019 19:58    Procedures Procedures (including critical care time)  Medications Ordered in ED Medications  potassium chloride SA (K-DUR) CR tablet 20 mEq (has no administration in time range)     Initial Impression / Assessment and Plan / ED Course  I have reviewed the triage vital signs and the nursing notes.  Pertinent labs & imaging results that were available during my care of the patient were reviewed by me and considered in my medical decision making (see chart for details).  Clinical Course as of Feb 03 2306  Thu Feb 04, 2019  [id number] Acute infarct 31mm right frontal lobe per radiology.   [JR]  2000 Dr. Rory Percy with neurology to see patient. Requests medical admission.   [JR]    Clinical Course User Index [JR] Robinson, [place] N, PA-C       63 yo F with a cc of a CT finding of a possible stroke.  Patient with some left sided facial tingling but no other acute neuro symptoms.  I  discussed with the patient the typical work-up for an acute stroke.  The patient would like to have an MRI performed.  Since we do not have an MRI available at this facility we will send her to [facility].  I discussed the case with Dr. Ronnald Nian who accepted the patient in transfer.  The patients results and plan were reviewed  and discussed.   Any x-rays performed were independently reviewed by myself.   Differential diagnosis were considered with the presenting HPI.  Medications  potassium chloride SA (K-DUR) CR tablet 20 mEq (has no administration in time range)    Vitals:   02/04/19 1740 02/04/19 1836 02/04/19 1838 02/04/19 2210  BP: (!) 172/75 (!) 182/89  (!) 162/90  Pulse: 65  77 64  Resp: 18     Temp:      TempSrc:      SpO2: 97%  97% 96%  Weight:      Height:        Final diagnoses:  Chronic tension-type headache, not intractable      Final Clinical Impressions(s) / ED Diagnoses   Final diagnoses:  Chronic tension-type headache, not intractable    ED Discharge Orders    None       Deno Etienne, DO 02/04/19 [place], [place], DO 02/04/19 2307

## 2019-02-04 NOTE — H&P (Signed)
History and Physical    Krystal Vargas ZOX:[id number] DOB: May 11, 1956 DOA: 02/04/2019  PCP: Heron Nay, PA   Patient coming from: Home   Chief Complaint: Headaches, left facial tingling   HPI: Krystal Vargas is a 63 y.o. female with medical history significant for hypertension, type 2 diabetes mellitus, and cervical cancer status post hysterectomy, now presenting to the emergency department for evaluation of headaches, left facial tingling, and abnormal outpatient head CT.  Patient reports history of intermittent headaches going back at least a few months, then developed paresthesias involving her left face on 01/31/2019.  The left facial tingling resolved within a few minutes.  Outpatient head CT was obtained for evaluation of this and there was an age-indeterminate CVA.  With these CT results, she was directed to the ED for further evaluation. She describes the headaches as localized to the right occipital region, moderate in intensity, and intermittent.  She denies any fevers, shortness of breath, chest pain, or palpitations.  She does not have any headache, numbness, tingling, weakness, vision or hearing change currently in the ED.  ED Course: Upon arrival to the ED, patient is found to be afebrile, saturating well on room air, and with blood pressure 195/90.  EKG features a sinus rhythm.  Chemistry panel features a slight hypokalemia and CBC is unremarkable.  MRI brain is concerning for a 10 mm acute infarction within the right frontal lobe as well as multiple areas of chronic appearing microhemorrhages.  Neurology was consulted by the ED physician and COVID-19 screening test was ordered but not yet performed.  Review of Systems:  All other systems reviewed and apart from HPI, are negative.  Past Medical History:  Diagnosis Date   Asthma    Cancer (HCC)    Diabetes mellitus    Fibromyalgia    High cholesterol    Hypertension     Past Surgical History:  Procedure Laterality Date    TUBAL LIGATION       reports that she has quit smoking. She has never used smokeless tobacco. She reports current alcohol use. She reports that she does not use drugs.  No Known Allergies  Family History  Problem Relation Age of Onset   Hypertension Mother    Diabetes Mellitus II Mother    Bronchitis Father    Hypertension Sister    Diabetes Mellitus II Sister    Hypertension Brother    Diabetes Mellitus II Brother      Prior to Admission medications   Medication Sig Start Date End Date Taking? Authorizing Provider  aspirin EC 81 MG EC tablet Take 1 tablet (81 mg total) by mouth daily. 06/11/18   Clydie Braun, MD  atorvastatin (LIPITOR) 40 MG tablet Take 40 mg by mouth daily. 11/23/16   [provider]  glipiZIDE (GLUCOTROL XL) 5 MG 24 hr tablet Take 5 mg by mouth daily with breakfast.    [provider]  losartan-hydrochlorothiazide (HYZAAR) 100-25 MG tablet Take 1 tablet by mouth daily after breakfast. 11/23/16   [provider]  metFORMIN (GLUMETZA) 500 MG (MOD) 24 hr tablet Take 1,000 mg by mouth 2 (two) times daily with a meal.     [provider]  Multiple Vitamins-Minerals (MULTIVITAMIN GUMMIES WOMENS PO) Take 1 capsule by mouth daily.    [provider]  nitroGLYCERIN (NITROSTAT) 0.4 MG SL tablet Place 1 tablet (0.4 mg total) under the tongue every 5 (five) minutes as needed for chest pain. 06/10/18   Clydie Braun,  MD  pantoprazole (PROTONIX) 40 MG tablet Take 1 tablet (40 mg total) by mouth daily. Patient not taking: Reported on 06/09/2018 01/10/16   Cathren Laine, MD    Physical Exam: Vitals:   02/04/19 1440 02/04/19 1740 02/04/19 1836 02/04/19 1838  BP:  (!) 172/75 (!) 182/89   Pulse:  65  77  Resp:  18    Temp:      TempSrc:      SpO2:  97%  97%  Weight: 90.3 kg     Height: 5\' 2"  (1.575 m)       Constitutional: NAD, calm  Eyes: PERTLA, lids and conjunctivae normal ENMT: Mucous membranes are moist.  Posterior pharynx clear of any exudate or lesions.   Neck: normal, supple, no masses, no thyromegaly Respiratory: no wheezing, no crackles. Normal respiratory effort. No accessory muscle use.  Cardiovascular: S1 & S2 heard, regular rate and rhythm. No extremity edema.   Abdomen: No distension, no tenderness, soft. Bowel sounds active.  Musculoskeletal: no clubbing / cyanosis. No joint deformity upper and lower extremities.    Skin: no significant rashes, lesions, ulcers. Warm, dry, well-perfused. Neurologic: CN 2-12 grossly intact. Sensation intact. Strength 5/5 in all 4 limbs.  Psychiatric:  Alert and oriented to person, place, and situation. Pleasant, cooperative.    Labs on Admission: I have personally reviewed following labs and imaging studies  CBC: Recent Labs  Lab 02/04/19 1537  WBC 7.1  NEUTROABS 4.0  HGB 12.2  HCT 38.4  MCV 93.4  PLT 301   Basic Metabolic Panel: Recent Labs  Lab 02/04/19 1537  NA 137  K 3.4*  CL 100  CO2 27  GLUCOSE 141*  BUN 17  CREATININE 0.82  CALCIUM 9.3   GFR: Estimated Creatinine Clearance: 72.4 mL/min (by C-G formula based on SCr of 0.82 mg/dL). Liver Function Tests: No results for input(s): AST, ALT, ALKPHOS, BILITOT, PROT, ALBUMIN in the last 168 hours. No results for input(s): LIPASE, AMYLASE in the last 168 hours. No results for input(s): AMMONIA in the last 168 hours. Coagulation Profile: No results for input(s): INR, PROTIME in the last 168 hours. Cardiac Enzymes: No results for input(s): CKTOTAL, CKMB, CKMBINDEX, TROPONINI in the last 168 hours. BNP (last 3 results) No results for input(s): PROBNP in the last 8760 hours. HbA1C: No results for input(s): HGBA1C in the last 72 hours. CBG: No results for input(s): GLUCAP in the last 168 hours. Lipid Profile: No results for input(s): CHOL, HDL, LDLCALC, TRIG, CHOLHDL, LDLDIRECT in the last 72 hours. Thyroid Function Tests: No results for input(s): TSH, T4TOTAL, FREET4, T3FREE,  THYROIDAB in the last 72 hours. Anemia Panel: No results for input(s): VITAMINB12, FOLATE, FERRITIN, TIBC, IRON, RETICCTPCT in the last 72 hours. Urine analysis: No results found for: COLORURINE, APPEARANCEUR, LABSPEC, PHURINE, GLUCOSEU, HGBUR, BILIRUBINUR, KETONESUR, PROTEINUR, UROBILINOGEN, NITRITE, LEUKOCYTESUR Sepsis Labs: @LABRCNTIP (procalcitonin:4,lacticidven:4) )No results found for this or any previous visit (from the past 240 hour(s)).   Radiological Exams on Admission: Mr Brain Wo Contrast  Result Date: 02/04/2019 CLINICAL DATA:  Headache, chronic, normal neuro exam. Persistent cough and fatigue and headache. Head CT showing age-indeterminate lacunar infarct. EXAM: MRI HEAD WITHOUT CONTRAST TECHNIQUE: Multiplanar, multiecho pulse sequences of the brain and surrounding structures were obtained without intravenous contrast. COMPARISON:  Head CT 12/24/2009 FINDINGS: Brain: 10 mm acute lacunar infarct within the right frontal lobe subcortical white matter. Associated T2/FLAIR hyperintensity at this site. No evidence of intracranial mass. No midline shift or extra-axial fluid collection. There are multiple  chronic microhemorrhages within the bilateral cerebral hemispheres, but also within the bilateral basal ganglia/thalami and within the brainstem. Moderate scattered and confluent T2/FLAIR hyperintensity within the cerebral white matter is nonspecific, but consistent with chronic small vessel ischemic disease. Small chronic right thalamic lacunar infarct. Cerebral volume is normal for age. Vascular: Flow voids maintained within the proximal large arterial vessels. Skull and upper cervical spine: No focal marrow lesion. Incompletely assessed cervical spondylosis. Sinuses/Orbits: Visualized orbits demonstrate no acute abnormality. Minimal scattered paranasal sinus mucosal thickening at the imaged levels. No significant mastoid effusion. Findings of acute right frontal lobe subcortical white matter  infarct were called by telephone at the time of interpretation on 02/04/2019 at 7:56 pm to provider Dr. Rodena Medin, who verbally acknowledged these results. IMPRESSION: 1. 10 mm acute infarct within the right frontal lobe subcortical white matter. 2. Moderate chronic small vessel ischemic disease. Chronic right thalamic lacunar infarct. 3. Multiple chronic microhemorrhages within the cerebral hemispheres, but also involving the bilateral basal ganglia/thalami and brainstem. Findings are suggestive of cerebral amyloid angiopathy, possibly with superimposed chronic hypertensive microangiopathy. Electronically Signed   By: Jackey Loge   On: 02/04/2019 19:58    EKG: Independently reviewed. Sinus rhythm, rate 55, inferolateral T-wave abnormality similar to prior.   Assessment/Plan  1. Acute CVA  - Presents with left facial paraesthesias and outpatient head CT with age-indeterminate CVA  - MRI brain in ED with acute infarction within right frontal lobe and multiple chronic microhemorrhages concerning for hypertensive or amyloid angiopathy  - Neurology is consulting and much appreciated  - Continue cardiac monitoring, frequent neuro checks, NPO pending swallow eval, PT/OT/SLP evals  - Check CTA head and neck, TTE, A1c, and fasting lipids  - Continue daily aspirin, increase Lipitor to 80 mg    2. Type II DM  - A1c was 6.9% in July 2020 - Managed at home with metformin and glimepiride, held on admission   - Check CBG's and use a SSI with Novolog while in hospital    3. Hypertension  - Permit HTN in acute phase of ischemic CVA     PPE: Mask, face shield  DVT prophylaxis: Lovenox  Code Status: Full  Family Communication: Discussed with patient  Consults called: Neurology  Admission status: Observation     Briscoe Deutscher, MD [facility] Pager [phone]  If 7PM-7AM, please contact night-coverage [email or web address] Password [facility]  02/04/2019, 8:59 PM

## 2019-02-05 ENCOUNTER — Inpatient Hospital Stay (HOSPITAL_COMMUNITY): Payer: Medicare Other

## 2019-02-05 ENCOUNTER — Observation Stay (HOSPITAL_COMMUNITY): Payer: Medicare Other

## 2019-02-05 DIAGNOSIS — R51 Headache: Secondary | ICD-10-CM | POA: Diagnosis present

## 2019-02-05 DIAGNOSIS — I6381 Other cerebral infarction due to occlusion or stenosis of small artery: Secondary | ICD-10-CM | POA: Diagnosis present

## 2019-02-05 DIAGNOSIS — Z833 Family history of diabetes mellitus: Secondary | ICD-10-CM | POA: Diagnosis not present

## 2019-02-05 DIAGNOSIS — I361 Nonrheumatic tricuspid (valve) insufficiency: Secondary | ICD-10-CM | POA: Diagnosis not present

## 2019-02-05 DIAGNOSIS — E669 Obesity, unspecified: Secondary | ICD-10-CM | POA: Diagnosis present

## 2019-02-05 DIAGNOSIS — G44229 Chronic tension-type headache, not intractable: Secondary | ICD-10-CM

## 2019-02-05 DIAGNOSIS — R29701 NIHSS score 1: Secondary | ICD-10-CM | POA: Diagnosis present

## 2019-02-05 DIAGNOSIS — Z87891 Personal history of nicotine dependence: Secondary | ICD-10-CM | POA: Diagnosis not present

## 2019-02-05 DIAGNOSIS — Z8701 Personal history of pneumonia (recurrent): Secondary | ICD-10-CM | POA: Diagnosis not present

## 2019-02-05 DIAGNOSIS — E1151 Type 2 diabetes mellitus with diabetic peripheral angiopathy without gangrene: Secondary | ICD-10-CM | POA: Diagnosis present

## 2019-02-05 DIAGNOSIS — I680 Cerebral amyloid angiopathy: Secondary | ICD-10-CM

## 2019-02-05 DIAGNOSIS — Z20828 Contact with and (suspected) exposure to other viral communicable diseases: Secondary | ICD-10-CM | POA: Diagnosis present

## 2019-02-05 DIAGNOSIS — E854 Organ-limited amyloidosis: Secondary | ICD-10-CM | POA: Diagnosis present

## 2019-02-05 DIAGNOSIS — Z881 Allergy status to other antibiotic agents status: Secondary | ICD-10-CM | POA: Diagnosis not present

## 2019-02-05 DIAGNOSIS — J45909 Unspecified asthma, uncomplicated: Secondary | ICD-10-CM | POA: Diagnosis present

## 2019-02-05 DIAGNOSIS — E119 Type 2 diabetes mellitus without complications: Secondary | ICD-10-CM | POA: Diagnosis not present

## 2019-02-05 DIAGNOSIS — Z8541 Personal history of malignant neoplasm of cervix uteri: Secondary | ICD-10-CM | POA: Diagnosis not present

## 2019-02-05 DIAGNOSIS — I639 Cerebral infarction, unspecified: Secondary | ICD-10-CM | POA: Diagnosis present

## 2019-02-05 DIAGNOSIS — Z6836 Body mass index (BMI) 36.0-36.9, adult: Secondary | ICD-10-CM | POA: Diagnosis not present

## 2019-02-05 DIAGNOSIS — E876 Hypokalemia: Secondary | ICD-10-CM | POA: Diagnosis present

## 2019-02-05 DIAGNOSIS — Z7982 Long term (current) use of aspirin: Secondary | ICD-10-CM | POA: Diagnosis not present

## 2019-02-05 DIAGNOSIS — Z79899 Other long term (current) drug therapy: Secondary | ICD-10-CM | POA: Diagnosis not present

## 2019-02-05 DIAGNOSIS — Z90710 Acquired absence of both cervix and uterus: Secondary | ICD-10-CM | POA: Diagnosis not present

## 2019-02-05 DIAGNOSIS — E785 Hyperlipidemia, unspecified: Secondary | ICD-10-CM | POA: Diagnosis present

## 2019-02-05 DIAGNOSIS — Z7984 Long term (current) use of oral hypoglycemic drugs: Secondary | ICD-10-CM | POA: Diagnosis not present

## 2019-02-05 DIAGNOSIS — E7800 Pure hypercholesterolemia, unspecified: Secondary | ICD-10-CM | POA: Diagnosis present

## 2019-02-05 DIAGNOSIS — I10 Essential (primary) hypertension: Secondary | ICD-10-CM | POA: Diagnosis present

## 2019-02-05 DIAGNOSIS — M797 Fibromyalgia: Secondary | ICD-10-CM | POA: Diagnosis present

## 2019-02-05 LAB — ECHOCARDIOGRAM COMPLETE
Height: 62 in
Weight: 3184 oz

## 2019-02-05 LAB — LIPID PANEL
Cholesterol: 161 mg/dL (ref 0–200)
HDL: 60 mg/dL (ref >40)
LDL Cholesterol: 86 mg/dL (ref 0–99)
Total CHOL/HDL Ratio: 2.7 RATIO
Triglycerides: 76 mg/dL (ref <150)
VLDL: 15 mg/dL (ref 0–40)

## 2019-02-05 LAB — GLUCOSE, CAPILLARY
Glucose-Capillary: 100 mg/dL — ABNORMAL HIGH (ref 70–99)
Glucose-Capillary: 165 mg/dL — ABNORMAL HIGH (ref 70–99)
Glucose-Capillary: 187 mg/dL — ABNORMAL HIGH (ref 70–99)
Glucose-Capillary: 106 mg/dL — ABNORMAL HIGH (ref 70–99)

## 2019-02-05 LAB — CBG MONITORING, ED: Glucose-Capillary: 70 mg/dL (ref 70–99)

## 2019-02-05 LAB — HEMOGLOBIN A1C
Hgb A1c MFr Bld: 7 % — ABNORMAL HIGH (ref 4.8–5.6)
Mean Plasma Glucose: 154.2 mg/dL

## 2019-02-05 MED ORDER — LOSARTAN POTASSIUM-HCTZ 100-25 MG PO TABS: 1.0000 | ORAL_TABLET | Freq: Every day | ORAL | Status: DC

## 2019-02-05 MED ORDER — METFORMIN HCL ER (MOD) 500 MG PO TB24: 1000.0000 mg | ORAL_TABLET | Freq: Two times a day (BID) | ORAL | Status: DC

## 2019-02-05 MED ORDER — LOSARTAN POTASSIUM 50 MG PO TABS: 100.0000 mg | ORAL_TABLET | Freq: Every day | ORAL | Status: DC

## 2019-02-05 MED ORDER — ATORVASTATIN CALCIUM 80 MG PO TABS: 80.0000 mg | ORAL_TABLET | Freq: Every day | ORAL | 0 refills | Status: DC

## 2019-02-05 MED ORDER — ASPIRIN 325 MG PO TBEC: 325.0000 mg | DELAYED_RELEASE_TABLET | Freq: Every day | ORAL | 0 refills | Status: DC

## 2019-02-05 MED ORDER — ASPIRIN EC 325 MG PO TBEC
325.0000 mg | DELAYED_RELEASE_TABLET | Freq: Every day | ORAL | Status: DC
  Administered 2019-02-05: 325 mg via ORAL
  Filled 2019-02-05: qty 1

## 2019-02-05 MED ORDER — HYDROCHLOROTHIAZIDE 25 MG PO TABS: 25.0000 mg | ORAL_TABLET | Freq: Every day | ORAL | Status: DC

## 2019-02-05 NOTE — Evaluation (Signed)
Speech Language Pathology Evaluation Patient Details Name: Krystal Vargas MRN: [id number] DOB: [birth year] Today's Date: 02/05/2019 Time: 4132-4401 SLP Time Calculation (min) (ACUTE ONLY): 23 min  Problem List:  Patient Active Problem List   Diagnosis Date Noted  . CVA (cerebral vascular accident) (HCC) 02/05/2019  . Acute ischemic stroke (HCC) 02/04/2019  . Obesity (BMI 30-39.9) 06/11/2018  . Chest pain 06/09/2018  . SOB (shortness of breath) 02/07/2017  . Chest pressure 02/07/2017  . Diabetes mellitus without complication (HCC) 02/07/2017  . GERD (gastroesophageal reflux disease) 02/07/2017  . Endometrial cancer (HCC) 02/07/2017  . Hypokalemia 02/07/2017  . Left arm pain 02/07/2017  . Hypertension   . Hyperlipidemia associated with type 2 diabetes mellitus (HCC)   . Asthma    Past Medical History:  Past Medical History:  Diagnosis Date  . Asthma   . Cancer (HCC)   . Diabetes mellitus   . Fibromyalgia   . High cholesterol   . Hypertension    Past Surgical History:  Past Surgical History:  Procedure Laterality Date  . TUBAL LIGATION     HPI:  Pt is a 63 y.o. female with medical history significant for hypertension, type 2 diabetes mellitus, and cervical cancer status post hysterectomy, now presenting to the emergency department for evaluation of headaches, left facial tingling, and abnormal outpatient head CT. MRI of the brain revealed 10 mm acute infarct within the right frontal lobe subcortical white matter. Moderate chronic small vessel ischemic disease. Chronic right thalamic lacunar infarct.    Assessment / Plan / Recommendation Clinical Impression  Pt reported that she is currently unemployed since she is the primary caregiver for her husband who had a CVA two years prior. She reported that she has always had a "bad" memory but she denied any other baseline cognitive-linguistic deficits or any acute changes in cognition, speech or language. [facility] Cognitive  Assessment 8.1 was completed to evaluate the pt's cognitive-linguistic skills. She achieved a score of 27/30 which is within the normal limits of 26 or more out of 30 and no speech/language deficits were demonstrated. Further skilled SLP services are not clinically indicated at this time. Pt and nursing were educated regarding results and recommendations; both parties verbalized understanding as well as agreement with plan of care.    SLP Assessment  SLP Recommendation/Assessment: Patient does not need any further Speech Lanaguage Pathology Services SLP Visit Diagnosis: Cognitive communication deficit (R41.841)    Follow Up Recommendations  None    Frequency and Duration           SLP Evaluation Cognition  Overall Cognitive Status: Within Functional Limits for tasks assessed Arousal/Alertness: Awake/alert Orientation Level: Oriented X4 Attention: Focused;Sustained Focused Attention: Appears intact(Vigilance WNL: 1/1) Sustained Attention: Appears intact(Serial 7s: 3/3) Memory: Impaired Memory Impairment: Storage deficit;Retrieval deficit;Decreased recall of new information(Immediate: 5/5; delayed: 4/5; with cue: 1/1) Awareness: Appears intact Problem Solving: Appears intact Executive Function: Reasoning;Sequencing;Organizing Reasoning: Impaired Reasoning Impairment: Verbal complex(Abstraction: 1/2) Sequencing: Appears intact(Clock drawing: 3/3) Organizing: Appears intact(Backward digit span: 1/1)       Comprehension  Auditory Comprehension Overall Auditory Comprehension: Appears within functional limits for tasks assessed Yes/No Questions: Within Functional Limits Commands: Within Functional Limits Complex Commands: (Trail completion: 0/1) Conversation: Complex Visual Recognition/Discrimination Discrimination: Within Function Limits Reading Comprehension Reading Status: Within funtional limits    Expression Expression Primary Mode of Expression: Verbal Verbal  Expression Overall Verbal Expression: Appears within functional limits for tasks assessed Initiation: No impairment Repetition: No impairment(2/2) Naming: No impairment Confrontation: Within functional  limits(3/3) Divergent: (0/1) Pragmatics: No impairment Written Expression Dominant Hand: Right Written Expression: Within Functional Limits(Copying cube: 1/1)   Oral / Motor  Oral Motor/Sensory Function Overall Oral Motor/Sensory Function: Within functional limits Motor Speech Overall Motor Speech: Appears within functional limits for tasks assessed Respiration: Within functional limits Phonation: Normal Resonance: Within functional limits Articulation: Within functional limitis Intelligibility: Intelligible Motor Planning: Witnin functional limits Motor Speech Errors: Not applicable   Kacee Koren I. Vear Clock, MS, CCC-SLP Acute Rehabilitation Services Office number [phone] Pager [phone]                    Scheryl Marten 02/05/2019, 9:48 AM

## 2019-02-05 NOTE — Progress Notes (Signed)
  Echocardiogram 2D Echocardiogram has been performed.  Krystal Vargas 02/05/2019, 8:19 AM

## 2019-02-05 NOTE — Progress Notes (Signed)
02/05/19 1336  PT Visit Information  Last PT Received On 02/05/19  Assistance Needed +1  History of Present Illness 63 y.o. female with medical history significant for hypertension, type 2 diabetes mellitus, and cervical cancer status post hysterectomy, now presenting to the emergency department for evaluation of headaches, left facial tingling, and abnormal outpatient head CT. MRI of the brain revealed 10 mm acute infarct within the right frontal lobe subcortical white matter. Moderate chronic small vessel ischemic disease. Chronic right thalamic lacunar infarct.    Precautions  Precautions None  Restrictions  Weight Bearing Restrictions No  Home Living  Family/patient expects to be discharged to: Private residence  Living Arrangements Spouse/significant other  Available Help at Discharge Available PRN/intermittently;Family  Type of Home Apartment  Home Access Level entry  Home Layout One level  Bathroom Shower/Tub Tub/shower unit;Walk-in shower  [place] bars - tub/shower;Shower seat  Prior Function  Level of Independence Independent  Comments takes care of spouse who had a "big" stroke a couple of years ago  Communication  Communication No difficulties  Pain Assessment  Pain Assessment No/denies pain  Cognition  Arousal/Alertness Awake/alert  Behavior During Therapy WFL for tasks assessed/performed  Overall Cognitive Status Within Functional Limits for tasks assessed  Upper Extremity Assessment  Upper Extremity Assessment Defer to OT evaluation  Lower Extremity Assessment  Lower Extremity Assessment Overall WFL for tasks assessed  Cervical / Trunk Assessment  Cervical / Trunk Assessment Other exceptions  Cervical / Trunk Exceptions Reports numbness in L side of face and neck   Bed Mobility  Overal bed mobility Independent  Transfers  Overall transfer level Independent  Ambulation/Gait  Ambulation/Gait assistance  Supervision  Gait Distance (Feet) 150 Feet  Assistive device None  Gait Pattern/deviations Step-through pattern;Decreased stride length;Wide base of support  General Gait Details Slower gait with wide BOS, however, pt reports this is baseline. Able to perform vertical and horizontal head turns without LOB. some SOB noted, however, oxygen sats at 96-98% on RA.  Gait velocity Decreased  Modified Rankin (Stroke Patients Only)  Pre-Morbid Rankin Score 0  Modified Rankin 4  Balance  Overall balance assessment Needs assistance  Sitting-balance support Feet supported;No upper extremity supported  Sitting balance-Leahy Scale Good  Standing balance support No upper extremity supported;During functional activity  Standing balance-Leahy Scale Fair  PT - End of Session  Activity Tolerance Patient tolerated treatment well  Patient left in bed;with call bell/phone within reach  Nurse Communication Mobility status  PT Assessment  PT Recommendation/Assessment Patent does not need any further PT services  PT Visit Diagnosis Other abnormalities of gait and mobility (R26.89)  No Skilled PT All education completed;Patient at baseline level of functioning  AM-PAC PT "6 Clicks" Mobility Outcome Measure (Version 2)  Help needed turning from your back to your side while in a flat bed without using bedrails? 4  Help needed moving from lying on your back to sitting on the side of a flat bed without using bedrails? 4  Help needed moving to and from a bed to a chair (including a wheelchair)? 4  Help needed standing up from a chair using your arms (e.g., wheelchair or bedside chair)? 4  Help needed to walk in hospital room? 4  Help needed climbing 3-5 steps with a railing?  3  6 Click Score 23  Consider Recommendation of Discharge To: Home with no services  PT Recommendation  Follow Up Recommendations No PT follow up  PT  equipment None recommended by PT  Acute Rehab PT Goals  Patient Stated Goal to go home   PT Goal Formulation With patient  Time For Goal Achievement 02/05/19  Potential to Achieve Goals Good  PT Time Calculation  PT Start Time (ACUTE ONLY) 1218  PT Stop Time (ACUTE ONLY) 1231  PT Time Calculation (min) (ACUTE ONLY) 13 min  PT General Charges  $$ ACUTE PT VISIT 1 Visit  PT Evaluation  $PT Eval Low Complexity 1 Low  Written Expression  Dominant Hand Right   Patient evaluated by Physical Therapy with no further acute PT needs identified. All education has been completed and the patient has no further questions. Pt performing mobility tasks at a supervision to independent level. No LOB noted during dynamic gait tasks. Pt with some SOB, however, oxygen sats at 96-98% on RA. Pt reports being at baseline for mobility. See below for any follow-up Physical Therapy or equipment needs. PT is signing off. Thank you for this referral. If needs change, please re-consult.  Leighton Ruff, PT, DPT  Acute Rehabilitation Services  Pager: [phone] Office: [phone]

## 2019-02-05 NOTE — Progress Notes (Signed)
CSW received a call from pt's RN asking for the [facility] ED RN CM.  CSW provided.  CSW will continue to follow for D/C needs.  Alphonse Guild. Jaan Fischel, LCSW, LCAS, CSI Transitions of Care Clinical Social Worker Care Coordination Department Ph: [phone]

## 2019-02-05 NOTE — Progress Notes (Signed)
Pt stable and resting comfortably. Face assessment shows symmetrical smile, no numbing tingling of face, extremities, no headache or pain in general.Grip continues to be strong, as well as push and pull with upper and lower extremities. Pt able to lift head, arms and legs w/o difficulty.

## 2019-02-05 NOTE — Progress Notes (Signed)
STROKE TEAM PROGRESS NOTE   INTERVAL HISTORY Pt lying in bed, awake alert, stated she had mild right back of head HA for 2-3 months but now only mild. No other neuro complains. Neuro intact.   Vitals:   02/04/19 1838 02/04/19 2210 02/05/19 0108 02/05/19 0734  BP:  (!) 162/90 (!) 192/79 (!) 178/80  Pulse: 77 64 (!) 49 (!) 55  Resp:    16  Temp:   97.7 F (36.5 C) 97.9 F (36.6 C)  TempSrc:   Oral Oral  SpO2: 97% 96% 99% 97%  Weight:      Height:        CBC:  Recent Labs  Lab 02/04/19 1537  WBC 7.1  NEUTROABS 4.0  HGB 12.2  HCT 38.4  MCV 93.4  PLT [id number]    Basic Metabolic Panel:  Recent Labs  Lab 02/04/19 1537  NA 137  K 3.4*  CL 100  CO2 27  GLUCOSE 141*  BUN 17  CREATININE 0.82  CALCIUM 9.3   Lipid Panel:     Component Value Date/Time   CHOL 161 02/05/2019 0701   TRIG 76 02/05/2019 0701   HDL 60 02/05/2019 0701   CHOLHDL 2.7 02/05/2019 0701   VLDL 15 02/05/2019 0701   LDLCALC 86 02/05/2019 0701   HgbA1c:  Lab Results  Component Value Date   HGBA1C 7.0 (H) 02/05/2019   Urine Drug Screen:     Component Value Date/Time   LABOPIA POSITIVE (A) 02/08/2017 0428   COCAINSCRNUR NONE DETECTED 02/08/2017 0428   LABBENZ NONE DETECTED 02/08/2017 0428   AMPHETMU NONE DETECTED 02/08/2017 0428   THCU NONE DETECTED 02/08/2017 0428   LABBARB NONE DETECTED 02/08/2017 0428    Alcohol Level No results found for: ETH  IMAGING Ct Angio Head W Or Wo Contrast  Result Date: 02/05/2019 CLINICAL DATA:  Follow-up examination for acute stroke, headaches. EXAM: CT ANGIOGRAPHY HEAD AND NECK TECHNIQUE: Multidetector CT imaging of the head and neck was performed using the standard protocol during bolus administration of intravenous contrast. Multiplanar CT image reconstructions and MIPs were obtained to evaluate the vascular anatomy. Carotid stenosis measurements (when applicable) are obtained utilizing NASCET criteria, using the distal internal carotid diameter as the  denominator. CONTRAST:  42mL OMNIPAQUE IOHEXOL 350 MG/ML SOLN COMPARISON:  Prior brain MRI from 02/04/2019 FINDINGS: CTA NECK FINDINGS Aortic arch: Visualized aortic arch of normal caliber with normal branch pattern. Bovine branching with common origin of the right brachiocephalic and left common carotid artery noted. Minor atheromatous plaque noted within the arch itself. No hemodynamically significant stenosis noted about the origin of the great vessels. Visualized subclavian arteries widely patent. Right carotid system: Right common and internal carotid arteries tortuous but widely patent without stenosis, dissection or occlusion. Left carotid system: Left common and internal carotid arteries tortuous but widely patent without stenosis, dissection or occlusion. Vertebral arteries: Both vertebral arteries arise from the subclavian arteries. Vertebral arteries tortuous bilaterally but widely patent within the neck without stenosis, dissection, or occlusion. Skeleton: No acute osseous finding. No discrete osseous lesions. Moderate cervical spondylosis at C3-4 through C6-7. Other neck: No other acute soft tissue abnormality within the neck. No mass lesion or adenopathy. Upper chest: Visualized upper chest demonstrates no acute finding. Focal extrapleural fat again noted at the left upper lobe. Review of the MIP images confirms the above findings CTA HEAD FINDINGS Anterior circulation: Both internal carotid arteries widely patent to the termini without stenosis. Minor atheromatous change noted within the carotid siphons. A1  segments patent bilaterally. Right A1 duplicated. Normal anterior communicating artery. Anterior cerebral arteries widely patent to their distal aspects without stenosis. No M1 stenosis or occlusion. Negative MCA bifurcations. Distal MCA branches well perfused and symmetric. Posterior circulation: Right vertebral artery divides at the takeoff of the right PICA, with a tiny branch ascending towards  the vertebrobasilar junction. Right PICA patent. Focal linear defect with outpouching of contrast within the proximal left V4 segment could reflect a small penetrating plaque or possibly short segment dissection (a series 12, image 407). No significant intimal irregularity or intraluminal thrombus to suggest that this is an acute finding. Resultant mild stenosis at this level. Left vertebral artery otherwise widely patent. Patent left PICA. Basilar widely patent to its distal aspect without stenosis. Superior cerebral arteries patent bilaterally. Fetal type origin left PCA. Right PCA supplied via the basilar as well as a robust right posterior communicating artery. PCAs well perfused to their distal aspects without stenosis. Venous sinuses: Patent. Anatomic variants: Fetal type left PCA.  No intracranial aneurysm. Review of the MIP images confirms the above findings IMPRESSION: 1. Negative CTA for large vessel occlusion. No hemodynamically significant or correctable stenosis identified. 2. Short-segment linear defect involving the proximal left V4 segment, which could reflect a small penetrating plaque or possibly short segment dissection. Resultant mild stenosis at this level. Findings suspected to be chronic in nature. 3. Diffuse tortuosity of the major arterial vasculature of the head and neck, suggesting chronic underlying hypertension. Electronically Signed   By: Jeannine Boga M.D.   On: 02/05/2019 01:51   Ct Angio Neck W Or Wo Contrast  Result Date: 02/05/2019 CLINICAL DATA:  Follow-up examination for acute stroke, headaches. EXAM: CT ANGIOGRAPHY HEAD AND NECK TECHNIQUE: Multidetector CT imaging of the head and neck was performed using the standard protocol during bolus administration of intravenous contrast. Multiplanar CT image reconstructions and MIPs were obtained to evaluate the vascular anatomy. Carotid stenosis measurements (when applicable) are obtained utilizing NASCET criteria, using the  distal internal carotid diameter as the denominator. CONTRAST:  66mL OMNIPAQUE IOHEXOL 350 MG/ML SOLN COMPARISON:  Prior brain MRI from 02/04/2019 FINDINGS: CTA NECK FINDINGS Aortic arch: Visualized aortic arch of normal caliber with normal branch pattern. Bovine branching with common origin of the right brachiocephalic and left common carotid artery noted. Minor atheromatous plaque noted within the arch itself. No hemodynamically significant stenosis noted about the origin of the great vessels. Visualized subclavian arteries widely patent. Right carotid system: Right common and internal carotid arteries tortuous but widely patent without stenosis, dissection or occlusion. Left carotid system: Left common and internal carotid arteries tortuous but widely patent without stenosis, dissection or occlusion. Vertebral arteries: Both vertebral arteries arise from the subclavian arteries. Vertebral arteries tortuous bilaterally but widely patent within the neck without stenosis, dissection, or occlusion. Skeleton: No acute osseous finding. No discrete osseous lesions. Moderate cervical spondylosis at C3-4 through C6-7. Other neck: No other acute soft tissue abnormality within the neck. No mass lesion or adenopathy. Upper chest: Visualized upper chest demonstrates no acute finding. Focal extrapleural fat again noted at the left upper lobe. Review of the MIP images confirms the above findings CTA HEAD FINDINGS Anterior circulation: Both internal carotid arteries widely patent to the termini without stenosis. Minor atheromatous change noted within the carotid siphons. A1 segments patent bilaterally. Right A1 duplicated. Normal anterior communicating artery. Anterior cerebral arteries widely patent to their distal aspects without stenosis. No M1 stenosis or occlusion. Negative MCA bifurcations. Distal MCA branches well perfused  and symmetric. Posterior circulation: Right vertebral artery divides at the takeoff of the right  PICA, with a tiny branch ascending towards the vertebrobasilar junction. Right PICA patent. Focal linear defect with outpouching of contrast within the proximal left V4 segment could reflect a small penetrating plaque or possibly short segment dissection (a series 12, image 407). No significant intimal irregularity or intraluminal thrombus to suggest that this is an acute finding. Resultant mild stenosis at this level. Left vertebral artery otherwise widely patent. Patent left PICA. Basilar widely patent to its distal aspect without stenosis. Superior cerebral arteries patent bilaterally. Fetal type origin left PCA. Right PCA supplied via the basilar as well as a robust right posterior communicating artery. PCAs well perfused to their distal aspects without stenosis. Venous sinuses: Patent. Anatomic variants: Fetal type left PCA.  No intracranial aneurysm. Review of the MIP images confirms the above findings IMPRESSION: 1. Negative CTA for large vessel occlusion. No hemodynamically significant or correctable stenosis identified. 2. Short-segment linear defect involving the proximal left V4 segment, which could reflect a small penetrating plaque or possibly short segment dissection. Resultant mild stenosis at this level. Findings suspected to be chronic in nature. 3. Diffuse tortuosity of the major arterial vasculature of the head and neck, suggesting chronic underlying hypertension. Electronically Signed   By: Jeannine Boga M.D.   On: 02/05/2019 01:51   Mr Brain Wo Contrast  Result Date: 02/04/2019 CLINICAL DATA:  Headache, chronic, normal neuro exam. Persistent cough and fatigue and headache. Head CT showing age-indeterminate lacunar infarct. EXAM: MRI HEAD WITHOUT CONTRAST TECHNIQUE: Multiplanar, multiecho pulse sequences of the brain and surrounding structures were obtained without intravenous contrast. COMPARISON:  Head CT 12/24/2009 FINDINGS: Brain: 10 mm acute lacunar infarct within the right  frontal lobe subcortical white matter. Associated T2/FLAIR hyperintensity at this site. No evidence of intracranial mass. No midline shift or extra-axial fluid collection. There are multiple chronic microhemorrhages within the bilateral cerebral hemispheres, but also within the bilateral basal ganglia/thalami and within the brainstem. Moderate scattered and confluent T2/FLAIR hyperintensity within the cerebral white matter is nonspecific, but consistent with chronic small vessel ischemic disease. Small chronic right thalamic lacunar infarct. Cerebral volume is normal for age. Vascular: Flow voids maintained within the proximal large arterial vessels. Skull and upper cervical spine: No focal marrow lesion. Incompletely assessed cervical spondylosis. Sinuses/Orbits: Visualized orbits demonstrate no acute abnormality. Minimal scattered paranasal sinus mucosal thickening at the imaged levels. No significant mastoid effusion. Findings of acute right frontal lobe subcortical white matter infarct were called by telephone at the time of interpretation on 02/04/2019 at 7:56 pm to provider Dr. Francia Greaves, who verbally acknowledged these results. IMPRESSION: 1. 10 mm acute infarct within the right frontal lobe subcortical white matter. 2. Moderate chronic small vessel ischemic disease. Chronic right thalamic lacunar infarct. 3. Multiple chronic microhemorrhages within the cerebral hemispheres, but also involving the bilateral basal ganglia/thalami and brainstem. Findings are suggestive of cerebral amyloid angiopathy, possibly with superimposed chronic hypertensive microangiopathy. Electronically Signed   By: Kellie Simmering   On: 02/04/2019 19:58    PHYSICAL EXAM  Temp:  [97.7 F (36.5 C)-98.5 F (36.9 C)] 98.5 F (36.9 C) (09/25 1727) Pulse Rate:  [49-77] 63 (09/25 1727) Resp:  [16-18] 18 (09/25 1727) BP: (162-192)/(79-90) 168/82 (09/25 1727) SpO2:  [94 %-99 %] 94 % (09/25 1727)  General - Well nourished, well  developed, in no apparent distress.  Ophthalmologic - fundi not visualized due to noncooperation.  Cardiovascular - Regular rhythm and rate.  Mental Status -  Level of arousal and orientation to time, place, and person were intact. Language including expression, naming, repetition, comprehension was assessed and found intact. Fund of Knowledge was assessed and was intact.  Cranial Nerves II - XII - II - Visual field intact OU. III, IV, VI - Extraocular movements intact. V - Facial sensation intact bilaterally. VII - Facial movement intact bilaterally. VIII - Hearing & vestibular intact bilaterally. X - Palate elevates symmetrically. XI - Chin turning & shoulder shrug intact bilaterally. XII - Tongue protrusion intact.  Motor Strength - The patient's strength was normal in all extremities and pronator drift was absent.  Bulk was normal and fasciculations were absent.   Motor Tone - Muscle tone was assessed at the neck and appendages and was normal.  Reflexes - The patient's reflexes were symmetrical in all extremities and she had no pathological reflexes.  Sensory - Light touch, temperature/pinprick were assessed and were symmetrical.    Coordination - The patient had normal movements in the hands with no ataxia or dysmetria.  Tremor was absent.  Gait and Station - deferred.   ASSESSMENT/PLAN Ms. Krystal Vargas is a 63 y.o. female with history of cervical cancer status post hysterectomy 2018, diabetes, hypertension, hyperlipidemia, fibromyalgia presenting with L facial numbness and HA that started 9/20 (4 days prior to admission).   Stroke:  right frontal lobe subcortical WM infarct secondary to small vessel disease source  MRI  R frontal lobe subcortical white matter infarct. Moderate small vessel disease. Old R thalamic lacune. Multiple chronic micro hemorrhages (B basal ganglia, B thalamic, brainstem) - suggestive of CAA vs longstanding HTN  CTA head & neck no LVO. Proximal L V4  plaque vs stenosis. Diffuse vessel tortuosity  2D Echo EF 75%. LA mildly dilated. No source of embolus   LDL 86  HgbA1c 7.0  Lovenox 40 mg sq daily for VTE prophylaxis  aspirin 81 mg daily prior to admission, now on aspirin 325 mg daily. Continue ASA on discharge. Recommend against DAPT at this time due to multiple cortical located CMBs concerning for early CAA  Therapy recommendations:  No OT, no PT   Disposition:  pending   Hypertension  Elevated in the 160-190s . Permissive hypertension (OK if < 180/105) but gradually normalize in 5-7 days . Long-term BP goal normotensive given possible early CAA  Hyperlipidemia  Home meds:  lipitor 40  Now on lipitor 80  LDL 86, goal < 70  Continue statin at discharge  Diabetes type II Uncontrolled  HgbA1c 7.0, goal < 7.0  CBGs  SSI  Close PCP follow up  Other Stroke Risk Factors  Former Cigarette smoker  ETOH use, advised to drink no more than 1 drink(s) a day  Obesity, Body mass index is 36.4 kg/m., recommend weight loss, diet and exercise as appropriate   Other Active Problems  Fibromyalgia   Hospital day # 0  Neurology will sign off. Please call with questions. Pt will follow up with stroke clinic Dr. Leonie Man at [facility] in about 4 weeks. Thanks for the consult.  Rosalin Hawking, MD PhD Stroke Neurology 02/05/2019 6:00 PM    To contact Stroke Continuity provider, please refer to [email or web address] After hours, contact General Neurology

## 2019-02-05 NOTE — Progress Notes (Signed)
Pt arrived to floor A&Ox4. No signs of distress. Able to communicate wants and needs.  Will continue to monitor.

## 2019-02-05 NOTE — ED Notes (Signed)
Pt was returned to the ed because of the malposition of the IV, iv was removed and a new one was placed on the other side. Pt returned to CT.

## 2019-02-05 NOTE — Discharge Summary (Signed)
Physician Discharge Summary  Krystal Vargas ZOX:[id number] DOB: August 26, 1955 DOA: 02/04/2019  PCP: Heron Nay, PA  Admit date: 02/04/2019 Discharge date: 02/05/2019  Admitted From: home Discharge disposition: home   Recommendations for Outpatient Follow-Up:   1. Further titration of BP medications to normal range   Discharge Diagnosis:   Principal Problem:   Acute ischemic stroke [facility]) Active Problems:   Hypertension   Diabetes mellitus without complication (HCC)   Hypokalemia   CVA (cerebral vascular accident) [facility])    Discharge Condition: Improved.  Diet recommendation: Low sodium, heart healthy.  Carbohydrate-modified.   Wound care: None.  Code status: Full.   History of Present Illness:   Krystal Vargas is a 63 y.o. female with medical history significant for hypertension, type 2 diabetes mellitus, and cervical cancer status post hysterectomy, now presenting to the emergency department for evaluation of headaches, left facial tingling, and abnormal outpatient head CT.  Patient reports history of intermittent headaches going back at least a few months, then developed paresthesias involving her left face on 01/31/2019.  The left facial tingling resolved within a few minutes.  Outpatient head CT was obtained for evaluation of this and there was an age-indeterminate CVA.  With these CT results, she was directed to the ED for further evaluation. She describes the headaches as localized to the right occipital region, moderate in intensity, and intermittent.  She denies any fevers, shortness of breath, chest pain, or palpitations.  She does not have any headache, numbness, tingling, weakness, vision or hearing change currently in the ED.   Hospital Course by Problem:   Stroke:  right frontal lobe subcortical WM infarct secondary to small vessel disease source  MRI  R frontal lobe subcortical white matter infarct. Moderate small vessel disease. Old R thalamic lacune.  Multiple chronic micro hemorrhages (B basal ganglia, B thalamic, brainstem) - suggestive of CAA vs longstanding HTN  CTA head & neck no LVO. Proximal L V4 plaque vs stenosis. Diffuse vessel tortuosity  2D Echo EF 75%. LA mildly dilated. No source of embolus   LDL 86  HgbA1c 7.0  aspirin 81 mg daily prior to admission, now on aspirin 325 mg daily. Continue ASA on discharge. Neurology recommend against DAPT at this time due to multiple cortical located CMBs concerning for early CAA  Therapy recommendations:  No OT, no PT   Hypertension  Elevated in the 160-190s  Permissive hypertension (OK if < 180/105) but gradually normalize in 5-7 days  Long-term BP goal normotensive given possible early CAA  Hyperlipidemia  Home meds:  lipitor 40  Now on lipitor 80  LDL 86, goal < 70  Diabetes type II Uncontrolled  HgbA1c 7.0, goal < 7.0   Obesity Estimated body mass index is 36.4 kg/m as calculated from the following:   Height as of this encounter: 5\' 2"  (1.575 m).   Weight as of this encounter: 90.3 kg.    Medical Consultants:   neurology   Discharge Exam:   Vitals:   02/05/19 0108 02/05/19 0734  BP: (!) 192/79 (!) 178/80  Pulse: (!) 49 (!) 55  Resp:  16  Temp: 97.7 F (36.5 C) 97.9 F (36.6 C)  SpO2: 99% 97%   Vitals:   02/04/19 1838 02/04/19 2210 02/05/19 0108 02/05/19 0734  BP:  (!) 162/90 (!) 192/79 (!) 178/80  Pulse: 77 64 (!) 49 (!) 55  Resp:    16  Temp:   97.7 F (36.5 C) 97.9 F (36.6 C)  TempSrc:   Oral Oral  SpO2: 97% 96% 99% 97%  Weight:      Height:        General exam: Appears calm and comfortable.  The results of significant diagnostics from this hospitalization (including imaging, microbiology, ancillary and laboratory) are listed below for reference.     Procedures and Diagnostic Studies:   Ct Angio Head W Or Wo Contrast  Result Date: 02/05/2019 CLINICAL DATA:  Follow-up examination for acute stroke, headaches. EXAM: CT  ANGIOGRAPHY HEAD AND NECK TECHNIQUE: Multidetector CT imaging of the head and neck was performed using the standard protocol during bolus administration of intravenous contrast. Multiplanar CT image reconstructions and MIPs were obtained to evaluate the vascular anatomy. Carotid stenosis measurements (when applicable) are obtained utilizing NASCET criteria, using the distal internal carotid diameter as the denominator. CONTRAST:  75mL OMNIPAQUE IOHEXOL 350 MG/ML SOLN COMPARISON:  Prior brain MRI from 02/04/2019 FINDINGS: CTA NECK FINDINGS Aortic arch: Visualized aortic arch of normal caliber with normal branch pattern. Bovine branching with common origin of the right brachiocephalic and left common carotid artery noted. Minor atheromatous plaque noted within the arch itself. No hemodynamically significant stenosis noted about the origin of the great vessels. Visualized subclavian arteries widely patent. Right carotid system: Right common and internal carotid arteries tortuous but widely patent without stenosis, dissection or occlusion. Left carotid system: Left common and internal carotid arteries tortuous but widely patent without stenosis, dissection or occlusion. Vertebral arteries: Both vertebral arteries arise from the subclavian arteries. Vertebral arteries tortuous bilaterally but widely patent within the neck without stenosis, dissection, or occlusion. Skeleton: No acute osseous finding. No discrete osseous lesions. Moderate cervical spondylosis at C3-4 through C6-7. Other neck: No other acute soft tissue abnormality within the neck. No mass lesion or adenopathy. Upper chest: Visualized upper chest demonstrates no acute finding. Focal extrapleural fat again noted at the left upper lobe. Review of the MIP images confirms the above findings CTA HEAD FINDINGS Anterior circulation: Both internal carotid arteries widely patent to the termini without stenosis. Minor atheromatous change noted within the carotid  siphons. A1 segments patent bilaterally. Right A1 duplicated. Normal anterior communicating artery. Anterior cerebral arteries widely patent to their distal aspects without stenosis. No M1 stenosis or occlusion. Negative MCA bifurcations. Distal MCA branches well perfused and symmetric. Posterior circulation: Right vertebral artery divides at the takeoff of the right PICA, with a tiny branch ascending towards the vertebrobasilar junction. Right PICA patent. Focal linear defect with outpouching of contrast within the proximal left V4 segment could reflect a small penetrating plaque or possibly short segment dissection (a series 12, image 407). No significant intimal irregularity or intraluminal thrombus to suggest that this is an acute finding. Resultant mild stenosis at this level. Left vertebral artery otherwise widely patent. Patent left PICA. Basilar widely patent to its distal aspect without stenosis. Superior cerebral arteries patent bilaterally. Fetal type origin left PCA. Right PCA supplied via the basilar as well as a robust right posterior communicating artery. PCAs well perfused to their distal aspects without stenosis. Venous sinuses: Patent. Anatomic variants: Fetal type left PCA.  No intracranial aneurysm. Review of the MIP images confirms the above findings IMPRESSION: 1. Negative CTA for large vessel occlusion. No hemodynamically significant or correctable stenosis identified. 2. Short-segment linear defect involving the proximal left V4 segment, which could reflect a small penetrating plaque or possibly short segment dissection. Resultant mild stenosis at this level. Findings suspected to be chronic in nature. 3. Diffuse tortuosity of the  major arterial vasculature of the head and neck, suggesting chronic underlying hypertension. Electronically Signed   By: Rise Mu M.D.   On: 02/05/2019 01:51   Ct Angio Neck W Or Wo Contrast  Result Date: 02/05/2019 CLINICAL DATA:  Follow-up  examination for acute stroke, headaches. EXAM: CT ANGIOGRAPHY HEAD AND NECK TECHNIQUE: Multidetector CT imaging of the head and neck was performed using the standard protocol during bolus administration of intravenous contrast. Multiplanar CT image reconstructions and MIPs were obtained to evaluate the vascular anatomy. Carotid stenosis measurements (when applicable) are obtained utilizing NASCET criteria, using the distal internal carotid diameter as the denominator. CONTRAST:  75mL OMNIPAQUE IOHEXOL 350 MG/ML SOLN COMPARISON:  Prior brain MRI from 02/04/2019 FINDINGS: CTA NECK FINDINGS Aortic arch: Visualized aortic arch of normal caliber with normal branch pattern. Bovine branching with common origin of the right brachiocephalic and left common carotid artery noted. Minor atheromatous plaque noted within the arch itself. No hemodynamically significant stenosis noted about the origin of the great vessels. Visualized subclavian arteries widely patent. Right carotid system: Right common and internal carotid arteries tortuous but widely patent without stenosis, dissection or occlusion. Left carotid system: Left common and internal carotid arteries tortuous but widely patent without stenosis, dissection or occlusion. Vertebral arteries: Both vertebral arteries arise from the subclavian arteries. Vertebral arteries tortuous bilaterally but widely patent within the neck without stenosis, dissection, or occlusion. Skeleton: No acute osseous finding. No discrete osseous lesions. Moderate cervical spondylosis at C3-4 through C6-7. Other neck: No other acute soft tissue abnormality within the neck. No mass lesion or adenopathy. Upper chest: Visualized upper chest demonstrates no acute finding. Focal extrapleural fat again noted at the left upper lobe. Review of the MIP images confirms the above findings CTA HEAD FINDINGS Anterior circulation: Both internal carotid arteries widely patent to the termini without stenosis.  Minor atheromatous change noted within the carotid siphons. A1 segments patent bilaterally. Right A1 duplicated. Normal anterior communicating artery. Anterior cerebral arteries widely patent to their distal aspects without stenosis. No M1 stenosis or occlusion. Negative MCA bifurcations. Distal MCA branches well perfused and symmetric. Posterior circulation: Right vertebral artery divides at the takeoff of the right PICA, with a tiny branch ascending towards the vertebrobasilar junction. Right PICA patent. Focal linear defect with outpouching of contrast within the proximal left V4 segment could reflect a small penetrating plaque or possibly short segment dissection (a series 12, image 407). No significant intimal irregularity or intraluminal thrombus to suggest that this is an acute finding. Resultant mild stenosis at this level. Left vertebral artery otherwise widely patent. Patent left PICA. Basilar widely patent to its distal aspect without stenosis. Superior cerebral arteries patent bilaterally. Fetal type origin left PCA. Right PCA supplied via the basilar as well as a robust right posterior communicating artery. PCAs well perfused to their distal aspects without stenosis. Venous sinuses: Patent. Anatomic variants: Fetal type left PCA.  No intracranial aneurysm. Review of the MIP images confirms the above findings IMPRESSION: 1. Negative CTA for large vessel occlusion. No hemodynamically significant or correctable stenosis identified. 2. Short-segment linear defect involving the proximal left V4 segment, which could reflect a small penetrating plaque or possibly short segment dissection. Resultant mild stenosis at this level. Findings suspected to be chronic in nature. 3. Diffuse tortuosity of the major arterial vasculature of the head and neck, suggesting chronic underlying hypertension. Electronically Signed   By: Rise Mu M.D.   On: 02/05/2019 01:51   Mr Brain Wo Contrast  Result  Date:  02/04/2019 CLINICAL DATA:  Headache, chronic, normal neuro exam. Persistent cough and fatigue and headache. Head CT showing age-indeterminate lacunar infarct. EXAM: MRI HEAD WITHOUT CONTRAST TECHNIQUE: Multiplanar, multiecho pulse sequences of the brain and surrounding structures were obtained without intravenous contrast. COMPARISON:  Head CT 12/24/2009 FINDINGS: Brain: 10 mm acute lacunar infarct within the right frontal lobe subcortical white matter. Associated T2/FLAIR hyperintensity at this site. No evidence of intracranial mass. No midline shift or extra-axial fluid collection. There are multiple chronic microhemorrhages within the bilateral cerebral hemispheres, but also within the bilateral basal ganglia/thalami and within the brainstem. Moderate scattered and confluent T2/FLAIR hyperintensity within the cerebral white matter is nonspecific, but consistent with chronic small vessel ischemic disease. Small chronic right thalamic lacunar infarct. Cerebral volume is normal for age. Vascular: Flow voids maintained within the proximal large arterial vessels. Skull and upper cervical spine: No focal marrow lesion. Incompletely assessed cervical spondylosis. Sinuses/Orbits: Visualized orbits demonstrate no acute abnormality. Minimal scattered paranasal sinus mucosal thickening at the imaged levels. No significant mastoid effusion. Findings of acute right frontal lobe subcortical white matter infarct were called by telephone at the time of interpretation on 02/04/2019 at 7:56 pm to provider Dr. Rodena Medin, who verbally acknowledged these results. IMPRESSION: 1. 10 mm acute infarct within the right frontal lobe subcortical white matter. 2. Moderate chronic small vessel ischemic disease. Chronic right thalamic lacunar infarct. 3. Multiple chronic microhemorrhages within the cerebral hemispheres, but also involving the bilateral basal ganglia/thalami and brainstem. Findings are suggestive of cerebral amyloid angiopathy,  possibly with superimposed chronic hypertensive microangiopathy. Electronically Signed   By: Jackey Loge   On: 02/04/2019 19:58     Labs:   Basic Metabolic Panel: Recent Labs  Lab 02/04/19 1537  NA 137  K 3.4*  CL 100  CO2 27  GLUCOSE 141*  BUN 17  CREATININE 0.82  CALCIUM 9.3   GFR Estimated Creatinine Clearance: 72.4 mL/min (by C-G formula based on SCr of 0.82 mg/dL). Liver Function Tests: No results for input(s): AST, ALT, ALKPHOS, BILITOT, PROT, ALBUMIN in the last 168 hours. No results for input(s): LIPASE, AMYLASE in the last 168 hours. No results for input(s): AMMONIA in the last 168 hours. Coagulation profile No results for input(s): INR, PROTIME in the last 168 hours.  CBC: Recent Labs  Lab 02/04/19 1537  WBC 7.1  NEUTROABS 4.0  HGB 12.2  HCT 38.4  MCV 93.4  PLT 301   Cardiac Enzymes: No results for input(s): CKTOTAL, CKMB, CKMBINDEX, TROPONINI in the last 168 hours. BNP: Invalid input(s): POCBNP CBG: Recent Labs  Lab 02/05/19 0029 02/05/19 0752 02/05/19 1107 02/05/19 1155  GLUCAP 70 100* 165* 187*   D-Dimer No results for input(s): DDIMER in the last 72 hours. Hgb A1c Recent Labs    02/05/19 0701  HGBA1C 7.0*   Lipid Profile Recent Labs    02/05/19 0701  CHOL 161  HDL 60  LDLCALC 86  TRIG 76  CHOLHDL 2.7   Thyroid function studies No results for input(s): TSH, T4TOTAL, T3FREE, THYROIDAB in the last 72 hours.  Invalid input(s): FREET3 Anemia work up No results for input(s): VITAMINB12, FOLATE, FERRITIN, TIBC, IRON, RETICCTPCT in the last 72 hours. Microbiology Recent Results (from the past 240 hour(s))  SARS Coronavirus 2 [facility] order, Performed in [facility] hospital lab) Nasopharyngeal Nasopharyngeal Swab     Status: None   Collection Time: 02/04/19 10:06 PM   Specimen: Nasopharyngeal Swab  Result Value Ref Range Status   SARS Coronavirus  2 NEGATIVE NEGATIVE Final    Comment: (NOTE) If result is NEGATIVE SARS-CoV-2  target nucleic acids are NOT DETECTED. The SARS-CoV-2 RNA is generally detectable in upper and lower  respiratory specimens during the acute phase of infection. The lowest  concentration of SARS-CoV-2 viral copies this assay can detect is 250  copies / mL. A negative result does not preclude SARS-CoV-2 infection  and should not be used as the sole basis for treatment or other  patient management decisions.  A negative result may occur with  improper specimen collection / handling, submission of specimen other  than nasopharyngeal swab, presence of viral mutation(s) within the  areas targeted by this assay, and inadequate number of viral copies  (<250 copies / mL). A negative result must be combined with clinical  observations, patient history, and epidemiological information. If result is POSITIVE SARS-CoV-2 target nucleic acids are DETECTED. The SARS-CoV-2 RNA is generally detectable in upper and lower  respiratory specimens dur ing the acute phase of infection.  Positive  results are indicative of active infection with SARS-CoV-2.  Clinical  correlation with patient history and other diagnostic information is  necessary to determine patient infection status.  Positive results do  not rule out bacterial infection or co-infection with other viruses. If result is PRESUMPTIVE POSTIVE SARS-CoV-2 nucleic acids MAY BE PRESENT.   A presumptive positive result was obtained on the submitted specimen  and confirmed on repeat testing.  While 2019 novel coronavirus  (SARS-CoV-2) nucleic acids may be present in the submitted sample  additional confirmatory testing may be necessary for epidemiological  and / or clinical management purposes  to differentiate between  SARS-CoV-2 and other Sarbecovirus currently known to infect humans.  If clinically indicated additional testing with an alternate test  methodology [phone]) is advised. The SARS-CoV-2 RNA is generally  detectable in upper and lower  respiratory sp ecimens during the acute  phase of infection. The expected result is Negative. Fact Sheet for Patients:  BoilerBrush.com.cy Fact Sheet for Healthcare Providers: https://pope.com/ This test is not yet approved or cleared by the [place] FDA and has been authorized for detection and/or diagnosis of SARS-CoV-2 by FDA under an Emergency Use Authorization (EUA).  This EUA will remain in effect (meaning this test can be used) for the duration of the COVID-19 declaration under Section 564(b)(1) of the Act, 21 U.S.C. section 360bbb-3(b)(1), unless the authorization is terminated or revoked sooner. Performed at [facility], [address], [place], [place] [address]      Discharge Instructions:   Discharge Instructions    Diet - low sodium heart healthy   Complete by: As directed    Diet Carb Modified   Complete by: As directed    Discharge instructions   Complete by: As directed    Keep log of blood pressure-- may need adjustments of your medications   Increase activity slowly   Complete by: As directed      Allergies as of 02/05/2019      Reactions   Doxycycline    Chest Pain      Medication List    TAKE these medications   aspirin 325 MG EC tablet Take 1 tablet (325 mg total) by mouth daily. Start taking on: February 06, 2019 What changed:   medication strength  how much to take   atorvastatin 80 MG tablet Commonly known as: LIPITOR Take 1 tablet (80 mg total) by mouth daily at 6 PM. What changed:   medication strength  how much to take  when to take this   glipiZIDE 5 MG 24 hr tablet Commonly known as: GLUCOTROL XL Take 5 mg by mouth daily with breakfast.   losartan-hydrochlorothiazide 100-25 MG tablet Commonly known as: HYZAAR Take 1 tablet by mouth daily after breakfast.   metFORMIN 500 MG (MOD) 24 hr tablet Commonly known as: GLUMETZA Take 2 tablets (1,000 mg total) by  mouth 2 (two) times daily with a meal. Start taking on: February 07, 2019 What changed: These instructions start on February 07, 2019. If you are unsure what to do until then, ask your doctor or other care provider.   MULTIVITAMIN GUMMIES WOMENS PO Take 1 capsule by mouth daily.   nitroGLYCERIN 0.4 MG SL tablet Commonly known as: NITROSTAT Place 1 tablet (0.4 mg total) under the tongue every 5 (five) minutes as needed for chest pain.   omeprazole 40 MG capsule Commonly known as: PRILOSEC Take 40 mg by mouth daily.   SYSTANE OP Place 1 drop into both eyes daily as needed (Dry eyes).      Follow-up Information    Go to  [facility].   Contact information: [address] [place] [phone]       Heron Nay, PA Follow up in 1 week(s).   Specialty: Physician Assistant Contact information: [address] [place] [address] [phone]            Time coordinating discharge:  Signed:  Joseph Art DO  [facility] 02/05/2019, 3:32 PM

## 2019-02-05 NOTE — Evaluation (Signed)
Occupational Therapy Evaluation Patient Details Name: Krystal Vargas MRN: [id number] DOB: 01-20-1956 Today's Date: 02/05/2019    History of Present Illness 63 y.o. female with medical history significant for hypertension, type 2 diabetes mellitus, and cervical cancer status post hysterectomy, now presenting to the emergency department for evaluation of headaches, left facial tingling, and abnormal outpatient head CT. MRI of the brain revealed 10 mm acute infarct within the right frontal lobe subcortical white matter. Moderate chronic small vessel ischemic disease. Chronic right thalamic lacunar infarct.     Clinical Impression   Pt admitted with the above diagnoses and presents with below problem list. Pt will benefit from continued acute OT to address the below listed deficits and maximize independence with basic ADLs prior to d/c home. 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 Pt is currently setup - supervision with ADLs.  Will follow acutely.      Follow Up Recommendations  No OT follow up    Equipment Recommendations  None recommended by OT    Recommendations for Other Services       Precautions / Restrictions Restrictions Weight Bearing Restrictions: No      Mobility Bed Mobility Overal bed mobility: Independent                Transfers Overall transfer level: Needs assistance Equipment used: None Transfers: Sit to/from Stand Sit to Stand: Supervision              Balance Overall balance assessment: Mild deficits observed, not formally tested                                         ADL either performed or assessed with clinical judgement   ADL Overall ADL's : Needs assistance/impaired Eating/Feeding: Independent   Grooming: Oral care;Supervision/safety;Standing   Upper Body Bathing: Set up;Sitting   Lower Body Bathing: Supervison/ safety;Sit to/from stand   Upper Body Dressing :  Set up;Sitting   Lower Body Dressing: Supervision/safety;Sit to/from stand   Toilet Transfer: Supervision/safety   Toileting- Architect and Hygiene: Supervision/safety   Tub/ Engineer, structural: Supervision/safety   Functional mobility during ADLs: Supervision/safety General ADL Comments: Pt navigated obstacle in the room and stood to brush teeth.      Vision Patient Visual Report: No change from baseline       Perception     Praxis      Pertinent Vitals/Pain Pain Assessment: No/denies pain     Hand Dominance Right   Extremity/Trunk Assessment Upper Extremity Assessment Upper Extremity Assessment: Overall WFL for tasks assessed   Lower Extremity Assessment Lower Extremity Assessment: Defer to PT evaluation;Overall [facility] for tasks assessed   Cervical / Trunk Assessment Cervical / Trunk Assessment: Normal   Communication Communication Communication: No difficulties   Cognition Arousal/Alertness: Awake/alert Behavior During Therapy: WFL for tasks assessed/performed Overall Cognitive Status: Within Functional Limits for tasks assessed                                     General Comments       Exercises     Shoulder Instructions      Home Living Family/patient expects to be discharged to:: Private residence Living Arrangements: Spouse/significant other Available Help at Discharge: Available PRN/intermittently;Family Type of Home: Apartment  Home Access: Level entry     Home Layout: One level     Bathroom Shower/Tub: Tub/shower unit;Walk-in shower   Bathroom Toilet: Handicapped height     Home Equipment: Grab bars - tub/shower;Shower seat      Lives With: Spouse    Prior Functioning/Environment Level of Independence: Independent        Comments: takes care of spouse who had a "big" stroke a couple of years ago        OT Problem List: Impaired balance (sitting and/or standing);Decreased knowledge of precautions       OT Treatment/Interventions: Self-care/ADL training;Therapeutic activities;Patient/family education;Balance training    OT Goals(Current goals can be found in the care plan section) Acute Rehab OT Goals Patient Stated Goal: home OT Goal Formulation: With patient Time For Goal Achievement: 02/19/19 Potential to Achieve Goals: Good ADL Goals Pt Will Perform Grooming: Independently Pt Will Perform Tub/Shower Transfer: Tub transfer;Independently;ambulating  OT Frequency: Min 2X/week   Barriers to D/C:            Co-evaluation              AM-PAC OT "6 Clicks" Daily Activity     Outcome Measure Help from another person eating meals?: None Help from another person taking care of personal grooming?: None Help from another person toileting, which includes using toliet, bedpan, or urinal?: None Help from another person bathing (including washing, rinsing, drying)?: None Help from another person to put on and taking off regular upper body clothing?: None Help from another person to put on and taking off regular lower body clothing?: None 6 Click Score: 24   End of Session    Activity Tolerance: Patient tolerated treatment well Patient left: in bed;with call bell/phone within reach  OT Visit Diagnosis: Unsteadiness on feet (R26.81)                Time: 5366-4403 OT Time Calculation (min): 15 min Charges:  OT General Charges $OT Visit: 1 Visit OT Evaluation $OT Eval Low Complexity: 1 Low  Raynald Kemp, OT Acute Rehabilitation Services Pager: [phone] Office: [phone]   Pilar Grammes 02/05/2019, 10:25 AM

## 2019-07-18 ENCOUNTER — Emergency Department (HOSPITAL_BASED_OUTPATIENT_CLINIC_OR_DEPARTMENT_OTHER): Payer: Medicare Other

## 2019-07-18 ENCOUNTER — Other Ambulatory Visit: Payer: Self-pay

## 2019-07-18 ENCOUNTER — Emergency Department (HOSPITAL_BASED_OUTPATIENT_CLINIC_OR_DEPARTMENT_OTHER)
Admission: EM | Admit: 2019-07-18 | Discharge: 2019-07-18 | Disposition: A | Discharge status: Home / Self Care | Payer: Medicare Other | Attending: Emergency Medicine | Admitting: Emergency Medicine

## 2019-07-18 DIAGNOSIS — E119 Type 2 diabetes mellitus without complications: Secondary | ICD-10-CM | POA: Insufficient documentation

## 2019-07-18 DIAGNOSIS — Z8673 Personal history of transient ischemic attack (TIA), and cerebral infarction without residual deficits: Secondary | ICD-10-CM | POA: Insufficient documentation

## 2019-07-18 DIAGNOSIS — M797 Fibromyalgia: Secondary | ICD-10-CM | POA: Diagnosis not present

## 2019-07-18 DIAGNOSIS — I10 Essential (primary) hypertension: Secondary | ICD-10-CM | POA: Diagnosis not present

## 2019-07-18 DIAGNOSIS — Z7984 Long term (current) use of oral hypoglycemic drugs: Secondary | ICD-10-CM | POA: Insufficient documentation

## 2019-07-18 DIAGNOSIS — R42 Dizziness and giddiness: Secondary | ICD-10-CM | POA: Insufficient documentation

## 2019-07-18 DIAGNOSIS — R0789 Other chest pain: Secondary | ICD-10-CM | POA: Insufficient documentation

## 2019-07-18 DIAGNOSIS — Z79899 Other long term (current) drug therapy: Secondary | ICD-10-CM | POA: Diagnosis not present

## 2019-07-18 DIAGNOSIS — R519 Headache, unspecified: Secondary | ICD-10-CM | POA: Diagnosis present

## 2019-07-18 LAB — URINALYSIS, ROUTINE W REFLEX MICROSCOPIC
Bilirubin Urine: NEGATIVE
Glucose, UA: NEGATIVE mg/dL
Hgb urine dipstick: NEGATIVE
Ketones, ur: NEGATIVE mg/dL
Leukocytes,Ua: NEGATIVE
Nitrite: NEGATIVE
Protein, ur: NEGATIVE mg/dL
Specific Gravity, Urine: 1.02 (ref 1.005–1.030)
pH: 7 (ref 5.0–8.0)

## 2019-07-18 LAB — CBG MONITORING, ED: Glucose-Capillary: 93 mg/dL (ref 70–99)

## 2019-07-18 LAB — CBC WITH DIFFERENTIAL/PLATELET
Abs Immature Granulocytes: 0.03 10*3/uL (ref 0.00–0.07)
Basophils Absolute: 0 10*3/uL (ref 0.0–0.1)
Basophils Relative: 0 %
Eosinophils Absolute: 0.1 10*3/uL (ref 0.0–0.5)
Eosinophils Relative: 2 %
HCT: 40.4 % (ref 36.0–46.0)
Hemoglobin: 13.1 g/dL (ref 12.0–15.0)
Immature Granulocytes: 0 %
Lymphocytes Relative: 41 %
Lymphs Abs: 3.8 10*3/uL (ref 0.7–4.0)
MCH: 30.2 pg (ref 26.0–34.0)
MCHC: 32.4 g/dL (ref 30.0–36.0)
MCV: 93.1 fL (ref 80.0–100.0)
Monocytes Absolute: 0.8 10*3/uL (ref 0.1–1.0)
Monocytes Relative: 8 %
Neutro Abs: 4.5 10*3/uL (ref 1.7–7.7)
Neutrophils Relative %: 49 %
Platelets: 288 10*3/uL (ref 150–400)
RBC: 4.34 MIL/uL (ref 3.87–5.11)
RDW: 15.1 % (ref 11.5–15.5)
WBC: 9.3 10*3/uL (ref 4.0–10.5)
nRBC: 0 % (ref 0.0–0.2)

## 2019-07-18 LAB — COMPREHENSIVE METABOLIC PANEL
ALT: 14 U/L (ref 0–44)
AST: 30 U/L (ref 15–41)
Albumin: 4.5 g/dL (ref 3.5–5.0)
Alkaline Phosphatase: 65 U/L (ref 38–126)
Anion gap: 10 (ref 5–15)
BUN: 20 mg/dL (ref 8–23)
CO2: 27 mmol/L (ref 22–32)
Calcium: 9.8 mg/dL (ref 8.9–10.3)
Chloride: 101 mmol/L (ref 98–111)
Creatinine, Ser: 0.68 mg/dL (ref 0.44–1.00)
GFR calc Af Amer: >60 mL/min (ref >60)
GFR calc non Af Amer: >60 mL/min (ref >60)
Glucose, Bld: 65 mg/dL — ABNORMAL LOW (ref 70–99)
Potassium: 3.4 mmol/L — ABNORMAL LOW (ref 3.5–5.1)
Sodium: 138 mmol/L (ref 135–145)
Total Bilirubin: 0.9 mg/dL (ref 0.3–1.2)
Total Protein: 7.8 g/dL (ref 6.5–8.1)

## 2019-07-18 LAB — TROPONIN I (HIGH SENSITIVITY)
Troponin I (High Sensitivity): <2 ng/L (ref <18)
Troponin I (High Sensitivity): 2 ng/L (ref <18)

## 2019-07-18 MED ORDER — KETOROLAC TROMETHAMINE 30 MG/ML IJ SOLN
15.0000 mg | Freq: Once | INTRAMUSCULAR | Status: AC
  Administered 2019-07-18: 15 mg via INTRAVENOUS
  Filled 2019-07-18: qty 1

## 2019-07-18 NOTE — ED Provider Notes (Signed)
[facility] EMERGENCY DEPARTMENT Provider Note   CSN: [id number] Arrival date & time: 07/18/19  1158     History Chief Complaint  Patient presents with  . Dizziness  . Headache    Krystal Vargas is a 64 y.o. female.  HPI Patient presents to the emergency department with headache has been ongoing for several weeks and she said that she started with chest pain when she was here in the ER.  The patient states that the pain is in the middle of her chest does not radiate.  She states that she is not had any shortness of breath or other symptoms associated with this.  The patient states that she had dizziness over the last few weeks as well.  Patient denies any other nausea or vomiting.  The patient denies shortness of breath, blurred vision, neck pain, fever, cough, weakness, numbness, dizziness, anorexia, edema, abdominal pain, nausea, vomiting, diarrhea, rash, back pain, dysuria, hematemesis, bloody stool, near syncope, or syncope.    Past Medical History:  Diagnosis Date  . Asthma   . Cancer ([place])   . Diabetes mellitus   . Fibromyalgia   . High cholesterol   . Hypertension     Patient Active Problem List   Diagnosis Date Noted  . CVA (cerebral vascular accident) ([place]) 02/05/2019  . Acute ischemic stroke ([place]) 02/04/2019  . Obesity (BMI 30-39.9) 06/11/2018  . Chest pain 06/09/2018  . SOB (shortness of breath) 02/07/2017  . Chest pressure 02/07/2017  . Diabetes mellitus without complication ([place]) [id number]  . GERD (gastroesophageal reflux disease) 02/07/2017  . Endometrial cancer ([place]) 02/07/2017  . Hypokalemia 02/07/2017  . Left arm pain 02/07/2017  . Hypertension   . Hyperlipidemia associated with type 2 diabetes mellitus ([place])   . Asthma     Past Surgical History:  Procedure Laterality Date  . TUBAL LIGATION       OB History   No obstetric history on file.     Family History  Problem Relation Age of Onset  . Hypertension Mother   . Diabetes Mellitus  II Mother   . Bronchitis Father   . Hypertension Sister   . Diabetes Mellitus II Sister   . Hypertension Brother   . Diabetes Mellitus II Brother     Social History   Tobacco Use  . Smoking status: Former Research scientist (life sciences)  . Smokeless tobacco: Never Used  Substance Use Topics  . Alcohol use: Yes    Comment: occ  . Drug use: No    Home Medications Prior to Admission medications   Medication Sig Start Date End Date Taking? Authorizing Provider  aspirin EC 325 MG EC tablet Take 1 tablet (325 mg total) by mouth daily. 02/06/19   Geradine Girt, DO  atorvastatin (LIPITOR) 80 MG tablet Take 1 tablet (80 mg total) by mouth daily at 6 PM. 02/05/19   Geradine Girt, DO  glipiZIDE (GLUCOTROL XL) 5 MG 24 hr tablet Take 5 mg by mouth daily with breakfast.    [provider]  losartan-hydrochlorothiazide (HYZAAR) 100-25 MG tablet Take 1 tablet by mouth daily after breakfast. 11/23/16   [provider]  metFORMIN (GLUMETZA) 500 MG (MOD) 24 hr tablet Take 2 tablets (1,000 mg total) by mouth 2 (two) times daily with a meal. 02/07/19   Geradine Girt, DO  Multiple Vitamins-Minerals (MULTIVITAMIN GUMMIES WOMENS PO) Take 1 capsule by mouth daily.    [provider]  nitroGLYCERIN (NITROSTAT) 0.4 MG SL tablet Place 1 tablet (0.4  mg total) under the tongue every 5 (five) minutes as needed for chest pain. 06/10/18   Norval Morton, MD  omeprazole (PRILOSEC) 40 MG capsule Take 40 mg by mouth daily.    [provider]  Polyethyl Glycol-Propyl Glycol (SYSTANE OP) Place 1 drop into both eyes daily as needed (Dry eyes).    [provider]    Allergies    Doxycycline  Review of Systems   Review of Systems All other systems negative except as documented in the HPI. All pertinent positives and negatives as reviewed in the HPI. Physical Exam Updated Vital Signs BP (!) 159/77 (BP Location: Right Arm)   Pulse 77   Temp 99.2 F (37.3 C) (Oral)   Resp 19   Ht 5\' 2"   (1.575 m)   Wt 85.3 kg   LMP 05/16/2012   SpO2 99%   BMI 34.39 kg/m   Physical Exam Vitals and nursing note reviewed.  Constitutional:      General: She is not in acute distress.    Appearance: She is well-developed.  HENT:     Head: Normocephalic and atraumatic.  Eyes:     Pupils: Pupils are equal, round, and reactive to light.  Cardiovascular:     Rate and Rhythm: Normal rate and regular rhythm.     Heart sounds: Normal heart sounds. No murmur. No friction rub. No gallop.   Pulmonary:     Effort: Pulmonary effort is normal. No respiratory distress.     Breath sounds: Normal breath sounds. No wheezing.  Abdominal:     General: Bowel sounds are normal. There is no distension.     Palpations: Abdomen is soft.     Tenderness: There is no abdominal tenderness.  Musculoskeletal:     Cervical back: Normal range of motion and neck supple.  Skin:    General: Skin is warm and dry.     Capillary Refill: Capillary refill takes less than 2 seconds.     Findings: No erythema or rash.  Neurological:     Mental Status: She is alert and oriented to person, place, and time.     Motor: No abnormal muscle tone.     Coordination: Coordination normal.  Psychiatric:        Behavior: Behavior normal.     ED Results / Procedures / Treatments   Labs (all labs ordered are listed, but only abnormal results are displayed) Labs Reviewed  COMPREHENSIVE METABOLIC PANEL - Abnormal; Notable for the following components:      Result Value   Potassium 3.4 (*)    Glucose, Bld 65 (*)    All other components within normal limits  CBC WITH DIFFERENTIAL/PLATELET  URINALYSIS, ROUTINE W REFLEX MICROSCOPIC  CBG MONITORING, ED  TROPONIN I (HIGH SENSITIVITY)  TROPONIN I (HIGH SENSITIVITY)    EKG EKG Interpretation  Date/Time:  Sunday July 18 2019 12:09:20 EST Ventricular Rate:  76 PR Interval:  172 QRS Duration: 82 QT Interval:  396 QTC Calculation: 445 R Axis:   68 Text  Interpretation: Normal sinus rhythm Nonspecific T wave abnormality Abnormal ECG No significant change since last tracing Confirmed by Davonna Belling [id number]) on 07/18/2019 2:06:45 PM   Radiology CT Head Wo Contrast  Result Date: 07/18/2019 CLINICAL DATA:  Headache for the past 2 weeks and dizziness today. Acute onset right arm pain today. EXAM: CT HEAD WITHOUT CONTRAST TECHNIQUE: Contiguous axial images were obtained from the base of the skull through the vertex without intravenous contrast. COMPARISON:  Brain  MR dated 02/04/2019 FINDINGS: Brain: Normal size and position of the ventricles. Mild-to-moderate patchy white matter low density in both cerebral hemispheres without significant change. Volume averaging with a deep sulcus on the left, without significant change. Stable bilateral inflammatory sooner infarcts. No intracranial hemorrhage, mass lesion or CT evidence of acute infarction. Vascular: No hyperdense vessel or unexpected calcification. Skull: Normal. Negative for fracture or focal lesion. Sinuses/Orbits: Unremarkable. Other: None. IMPRESSION: No acute abnormality. Stable mild to moderate chronic small vessel white matter ischemic changes in both cerebral hemispheres and old bilateral thalamic lacunar infarcts. Electronically Signed   By: Claudie Revering M.D.   On: 07/18/2019 14:11    Procedures Procedures (including critical care time)  Medications Ordered in ED Medications - No data to display  ED Course  I have reviewed the triage vital signs and the nursing notes.  Pertinent labs & imaging results that were available during my care of the patient were reviewed by me and considered in my medical decision making (see chart for details).    MDM Rules/Calculators/A&P                      Patient has atypical symptoms for cardiac chest pain.  The patient will be referred back to her primary care doctor.  She was advised the plan and all questions were answered thus far. Final Clinical  Impression(s) / ED Diagnoses Final diagnoses:  None    Rx / DC Orders ED Discharge Orders    None       Dalia Heading, PA-C 07/18/19 [place], Nathan, MD 07/22/19 [phone]

## 2019-07-18 NOTE — ED Notes (Signed)
Second troponin drawn and sent to the lab.  First troponin delayed due to no IV access.  Second drawn two hours after.

## 2019-07-18 NOTE — Discharge Instructions (Addendum)
Follow-up with your primary doctor soon as possible.  Return here as needed.  Increase your fluid intake as well.

## 2019-07-18 NOTE — ED Triage Notes (Signed)
Pt c/o HA x 2 weeks, and dizziness that pt reports started today. Pt also c/o acute onset right arm pain upon arrival to ED. Pt denies N/V. PT reports stroke in September. AOx4, normal gait to triage

## 2019-07-18 NOTE — ED Notes (Signed)
ED Provider at bedside. 

## 2019-07-18 NOTE — ED Notes (Signed)
Attempted IV x2 to bil AC, tol well. RN will attempt U/S guided IV

## 2019-09-15 ENCOUNTER — Inpatient Hospital Stay (HOSPITAL_BASED_OUTPATIENT_CLINIC_OR_DEPARTMENT_OTHER)
Admission: EM | Admit: 2019-09-15 | Discharge: 2019-09-17 | DRG: 065 | Disposition: A | Discharge status: Home Health | Payer: Medicare Other | Attending: Internal Medicine | Admitting: Internal Medicine

## 2019-09-15 ENCOUNTER — Other Ambulatory Visit: Payer: Self-pay

## 2019-09-15 ENCOUNTER — Encounter (HOSPITAL_BASED_OUTPATIENT_CLINIC_OR_DEPARTMENT_OTHER): Payer: Self-pay

## 2019-09-15 ENCOUNTER — Emergency Department (HOSPITAL_BASED_OUTPATIENT_CLINIC_OR_DEPARTMENT_OTHER): Payer: Medicare Other

## 2019-09-15 DIAGNOSIS — E669 Obesity, unspecified: Secondary | ICD-10-CM | POA: Diagnosis present

## 2019-09-15 DIAGNOSIS — K219 Gastro-esophageal reflux disease without esophagitis: Secondary | ICD-10-CM | POA: Diagnosis present

## 2019-09-15 DIAGNOSIS — Z6834 Body mass index (BMI) 34.0-34.9, adult: Secondary | ICD-10-CM | POA: Diagnosis not present

## 2019-09-15 DIAGNOSIS — J45909 Unspecified asthma, uncomplicated: Secondary | ICD-10-CM | POA: Diagnosis not present

## 2019-09-15 DIAGNOSIS — E1151 Type 2 diabetes mellitus with diabetic peripheral angiopathy without gangrene: Secondary | ICD-10-CM | POA: Diagnosis not present

## 2019-09-15 DIAGNOSIS — Z20822 Contact with and (suspected) exposure to covid-19: Secondary | ICD-10-CM | POA: Diagnosis not present

## 2019-09-15 DIAGNOSIS — I10 Essential (primary) hypertension: Secondary | ICD-10-CM | POA: Diagnosis present

## 2019-09-15 DIAGNOSIS — Z833 Family history of diabetes mellitus: Secondary | ICD-10-CM | POA: Diagnosis not present

## 2019-09-15 DIAGNOSIS — G8194 Hemiplegia, unspecified affecting left nondominant side: Secondary | ICD-10-CM | POA: Diagnosis not present

## 2019-09-15 DIAGNOSIS — Z7982 Long term (current) use of aspirin: Secondary | ICD-10-CM

## 2019-09-15 DIAGNOSIS — R29703 NIHSS score 3: Secondary | ICD-10-CM | POA: Diagnosis not present

## 2019-09-15 DIAGNOSIS — M797 Fibromyalgia: Secondary | ICD-10-CM | POA: Diagnosis present

## 2019-09-15 DIAGNOSIS — Z87891 Personal history of nicotine dependence: Secondary | ICD-10-CM | POA: Diagnosis not present

## 2019-09-15 DIAGNOSIS — R29810 Facial weakness: Secondary | ICD-10-CM | POA: Diagnosis present

## 2019-09-15 DIAGNOSIS — E119 Type 2 diabetes mellitus without complications: Secondary | ICD-10-CM

## 2019-09-15 DIAGNOSIS — Z7984 Long term (current) use of oral hypoglycemic drugs: Secondary | ICD-10-CM

## 2019-09-15 DIAGNOSIS — R1310 Dysphagia, unspecified: Secondary | ICD-10-CM | POA: Diagnosis present

## 2019-09-15 DIAGNOSIS — F8081 Childhood onset fluency disorder: Secondary | ICD-10-CM | POA: Diagnosis not present

## 2019-09-15 DIAGNOSIS — Z8541 Personal history of malignant neoplasm of cervix uteri: Secondary | ICD-10-CM

## 2019-09-15 DIAGNOSIS — I639 Cerebral infarction, unspecified: Secondary | ICD-10-CM | POA: Diagnosis not present

## 2019-09-15 DIAGNOSIS — E7800 Pure hypercholesterolemia, unspecified: Secondary | ICD-10-CM | POA: Diagnosis not present

## 2019-09-15 DIAGNOSIS — Z79899 Other long term (current) drug therapy: Secondary | ICD-10-CM | POA: Diagnosis not present

## 2019-09-15 DIAGNOSIS — E785 Hyperlipidemia, unspecified: Secondary | ICD-10-CM | POA: Diagnosis not present

## 2019-09-15 HISTORY — DX: Cerebral infarction, unspecified: I63.9

## 2019-09-15 LAB — CBC WITH DIFFERENTIAL/PLATELET
Abs Immature Granulocytes: 0.02 10*3/uL (ref 0.00–0.07)
Basophils Absolute: 0 10*3/uL (ref 0.0–0.1)
Basophils Relative: 0 %
Eosinophils Absolute: 0.2 10*3/uL (ref 0.0–0.5)
Eosinophils Relative: 3 %
HCT: 40.2 % (ref 36.0–46.0)
Hemoglobin: 13.3 g/dL (ref 12.0–15.0)
Immature Granulocytes: 0 %
Lymphocytes Relative: 33 %
Lymphs Abs: 2.4 10*3/uL (ref 0.7–4.0)
MCH: 30.2 pg (ref 26.0–34.0)
MCHC: 33.1 g/dL (ref 30.0–36.0)
MCV: 91.2 fL (ref 80.0–100.0)
Monocytes Absolute: 0.6 10*3/uL (ref 0.1–1.0)
Monocytes Relative: 8 %
Neutro Abs: 4.1 10*3/uL (ref 1.7–7.7)
Neutrophils Relative %: 56 %
Platelets: 287 10*3/uL (ref 150–400)
RBC: 4.41 MIL/uL (ref 3.87–5.11)
RDW: 15 % (ref 11.5–15.5)
WBC: 7.3 10*3/uL (ref 4.0–10.5)
nRBC: 0 % (ref 0.0–0.2)

## 2019-09-15 LAB — BASIC METABOLIC PANEL
Anion gap: 10 (ref 5–15)
BUN: 15 mg/dL (ref 8–23)
CO2: 29 mmol/L (ref 22–32)
Calcium: 9.6 mg/dL (ref 8.9–10.3)
Chloride: 101 mmol/L (ref 98–111)
Creatinine, Ser: 0.73 mg/dL (ref 0.44–1.00)
GFR calc Af Amer: >60 mL/min (ref >60)
GFR calc non Af Amer: >60 mL/min (ref >60)
Glucose, Bld: 165 mg/dL — ABNORMAL HIGH (ref 70–99)
Potassium: 3.3 mmol/L — ABNORMAL LOW (ref 3.5–5.1)
Sodium: 140 mmol/L (ref 135–145)

## 2019-09-15 LAB — RESPIRATORY PANEL BY RT PCR (FLU A&B, COVID)
Influenza A by PCR: NEGATIVE
Influenza B by PCR: NEGATIVE
SARS Coronavirus 2 by RT PCR: NEGATIVE

## 2019-09-15 MED ORDER — ASPIRIN 81 MG PO CHEW
324.0000 mg | CHEWABLE_TABLET | Freq: Once | ORAL | Status: AC
  Administered 2019-09-15: 324 mg via ORAL
  Filled 2019-09-15: qty 4

## 2019-09-15 NOTE — ED Notes (Signed)
Report called to Jonelle Sidle, receiving nurse at [facility]

## 2019-09-15 NOTE — ED Notes (Signed)
ED Provider at bedside. 

## 2019-09-15 NOTE — ED Notes (Signed)
Patient transported to CT 

## 2019-09-15 NOTE — ED Notes (Signed)
Report given to Barbara RN with [facility] 

## 2019-09-15 NOTE — ED Notes (Signed)
Ambulatory to BR, with assistance . Gait slightly unsteady. Family member at bedside

## 2019-09-15 NOTE — ED Triage Notes (Addendum)
Pt c/o HA-right temporal/parietal and right side of neck since 5/2-dizziness x 2 days-states at ~2pm today she woke from a nap with numbness to left side of face and neck-hx of stroke Sept 2020-NAD-steady gait

## 2019-09-15 NOTE — ED Provider Notes (Signed)
Krystal Vargas Provider Note   CSN: [id number] Arrival date & time: 09/15/19  1927     History Chief Complaint  Patient presents with  . Headache    Krystal Vargas is a 63 y.o. female.  HPI   64yf with L sided numbness. She laid down for a nap around 12pm. Woke up around 2pm with symptoms that have been persistent since. Numbness in L face, arm and leg. Speech sounds different to her. She has been having some R sided headaches recently that she saw neurology for yesterday. Still a mild HA today but not significantly changed. No acute visual changes. Her L leg feels "different" when she walks but she is not sure if this is from feeling numb or being weak.   Past Medical History:  Diagnosis Date  . Asthma   . Cancer ([place])   . Diabetes mellitus   . Fibromyalgia   . High cholesterol   . Hypertension   . Stroke [facility])     Patient Active Problem List   Diagnosis Date Noted  . CVA (cerebral vascular accident) ([place]) 02/05/2019  . Acute ischemic stroke ([place]) 02/04/2019  . Obesity (BMI 30-39.9) 06/11/2018  . Chest pain 06/09/2018  . SOB (shortness of breath) 02/07/2017  . Chest pressure 02/07/2017  . Diabetes mellitus without complication ([place]) [id number]  . GERD (gastroesophageal reflux disease) 02/07/2017  . Endometrial cancer ([place]) 02/07/2017  . Hypokalemia 02/07/2017  . Left arm pain 02/07/2017  . Hypertension   . Hyperlipidemia associated with type 2 diabetes mellitus ([place])   . Asthma     Past Surgical History:  Procedure Laterality Date  . TUBAL LIGATION       OB History   No obstetric history on file.     Family History  Problem Relation Age of Onset  . Hypertension Mother   . Diabetes Mellitus II Mother   . Bronchitis Father   . Hypertension Sister   . Diabetes Mellitus II Sister   . Hypertension Brother   . Diabetes Mellitus II Brother     Social History   Tobacco Use  . Smoking status: Former Research scientist (life sciences)  . Smokeless tobacco:  Never Used  Substance Use Topics  . Alcohol use: Yes    Comment: occ  . Drug use: No    Home Medications Prior to Admission medications   Medication Sig Start Date End Date Taking? Authorizing Provider  amLODipine (NORVASC) 10 MG tablet Take by mouth. 07/20/19  Yes [provider]  atorvastatin (LIPITOR) 80 MG tablet Take by mouth. 08/12/19  Yes [provider]  metFORMIN (GLUCOPHAGE-XR) 500 MG 24 hr tablet Take by mouth. 08/12/19  Yes [provider]  omeprazole (PRILOSEC) 40 MG capsule TAKE 1 CAPSULE BY MOUTH  DAILY 08/10/19  Yes [provider]  aspirin EC 325 MG EC tablet Take 1 tablet (325 mg total) by mouth daily. 02/06/19   Geradine Girt, DO  atorvastatin (LIPITOR) 80 MG tablet Take 1 tablet (80 mg total) by mouth daily at 6 PM. 02/05/19   Eulogio Bear U, DO  glipiZIDE (GLUCOTROL XL) 5 MG 24 hr tablet Take 5 mg by mouth daily with breakfast.    [provider]  hydrochlorothiazide (HYDRODIURIL) 25 MG tablet Take 25 mg by mouth daily. 08/17/19   [provider]  losartan-hydrochlorothiazide (HYZAAR) 100-25 MG tablet Take 1 tablet by mouth daily after breakfast. 11/23/16   [provider]  metFORMIN (GLUMETZA) 500 MG (MOD) 24 hr tablet Take  2 tablets (1,000 mg total) by mouth 2 (two) times daily with a meal. 02/07/19   Eulogio Bear U, DO  metoprolol succinate (TOPROL-XL) 50 MG 24 hr tablet Take 50 mg by mouth daily. 08/09/19   [provider]  Multiple Vitamins-Minerals (MULTIVITAMIN GUMMIES WOMENS PO) Take 1 capsule by mouth daily.    [provider]  nitroGLYCERIN (NITROSTAT) 0.4 MG SL tablet Place 1 tablet (0.4 mg total) under the tongue every 5 (five) minutes as needed for chest pain. 06/10/18   Norval Morton, MD  omeprazole (PRILOSEC) 40 MG capsule Take 40 mg by mouth daily.    [provider]  Polyethyl Glycol-Propyl Glycol (SYSTANE OP) Place 1 drop into both eyes daily as needed (Dry eyes).     [provider]    Allergies    Doxycycline  Review of Systems   Review of Systems All systems reviewed and negative, other than as noted in HPI.  Physical Exam Updated Vital Signs BP (!) 173/87 (BP Location: Right Arm)   Pulse 74   Temp 98.6 F (37 C) (Oral)   Resp (!) 22   Ht 5\' 2"  (1.575 m)   Wt 85.7 kg   LMP 05/16/2012   SpO2 99%   BMI 34.57 kg/m   Physical Exam Vitals and nursing note reviewed.  Constitutional:      General: She is not in acute distress.    Appearance: She is well-developed.  HENT:     Head: Normocephalic and atraumatic.  Eyes:     General:        Right eye: No discharge.        Left eye: No discharge.     Conjunctiva/sclera: Conjunctivae normal.  Cardiovascular:     Rate and Rhythm: Normal rate and regular rhythm.     Heart sounds: Normal heart sounds. No murmur. No friction rub. No gallop.   Pulmonary:     Effort: Pulmonary effort is normal. No respiratory distress.     Breath sounds: Normal breath sounds.  Abdominal:     General: There is no distension.     Palpations: Abdomen is soft.     Tenderness: There is no abdominal tenderness.  Musculoskeletal:        General: No tenderness.     Cervical back: Neck supple.  Skin:    General: Skin is warm and dry.  Neurological:     Mental Status: She is alert and oriented to person, place, and time.     Cranial Nerves: No cranial nerve deficit.     Sensory: Sensory deficit present.     Motor: No weakness.     Comments: Speech is somewhat hesitant at times and others more fluent particularly when she talks faster. Content appropriate. Decreased sensation L face to light touch. CN 2-12 otherwise intact. Decreased sensation L U/L ext. Strength 5/5 all extremities.   Psychiatric:        Behavior: Behavior normal.        Thought Content: Thought content normal.     ED Results / Procedures / Treatments   Labs (all labs ordered are listed, but only abnormal results are  displayed) Labs Reviewed  BASIC METABOLIC PANEL - Abnormal; Notable for the following components:      Result Value   Potassium 3.3 (*)    Glucose, Bld 165 (*)    All other components within normal limits  RESPIRATORY PANEL BY RT PCR (FLU A&B, COVID)  CBC WITH DIFFERENTIAL/PLATELET  EKG None  Radiology CT Head Wo Contrast  Result Date: 09/15/2019 CLINICAL DATA:  Focal neuro deficit, greater than 6 hours, stroke suspected. Additional history provided: Patient reports headache, right temporal/parietal to right side of neck since May 2nd, dizziness for 2 days, patient reports awaking from a nap at 2 p.m. today with numbness to left side of face and neck. EXAM: CT HEAD WITHOUT CONTRAST TECHNIQUE: Contiguous axial images were obtained from the base of the skull through the vertex without intravenous contrast. COMPARISON:  Noncontrast head CT 07/18/2019, brain MRI 02/04/2019. FINDINGS: Brain: There is a subcentimeter hypodensity within the right pons which was not present on head CT 07/18/2019 or brain MRI 02/04/2019 and is suspicious for acute or early subacute lacunar infarct (series 2, images 8 and 9). Stable mild to moderate ill-defined hypoattenuation within the cerebral white matter which is nonspecific, but consistent with chronic small vessel ischemic disease. Known chronic lacunar infarcts in the bilateral cerebral white matter and thalami, some of which were better appreciated on MRI 02/04/2019. Cerebral volume is normal for age. There is no acute intracranial hemorrhage. No demarcated cortical infarct. No extra-axial fluid collection. No evidence of intracranial mass. No midline shift. Vascular: No hyperdense vessel. Skull: Normal. Negative for fracture or focal lesion. Sinuses/Orbits: Visualized orbits show no acute finding. Mild ethmoid sinus mucosal thickening. No significant mastoid effusion. IMPRESSION: 1. Subcentimeter hypodensity within the right pons which is new as compared to head CT  07/18/2019 and suspicious for acute or early subacute lacunar infarct. Consider brain MRI for further evaluation. 2. Stable background mild-to-moderate chronic small vessel changes. Known chronic lacunar infarcts within the cerebral white matter and thalami. 3. Mild ethmoid sinus mucosal thickening. Electronically Signed   By: Kellie Simmering DO   On: 09/15/2019 21:52    Procedures Procedures (including critical care time)  Medications Ordered in ED Medications  aspirin chewable tablet 324 mg (has no administration in time range)    ED Course  I have reviewed the triage vital signs and the nursing notes.  Pertinent labs & imaging results that were available during my care of the patient were reviewed by me and considered in my medical decision making (see chart for details).    MDM Rules/Calculators/A&P                      64yF with L sided numbness. LKN 12p today. Presented outside of window for TPA. Imaging with possible acute to subacute stroke. Transfer to [facility].  Final Clinical Impression(s) / ED Diagnoses Final diagnoses:  Acute CVA (cerebrovascular accident) [facility])    Rx / [place] Orders ED Discharge Orders    None       Virgel Manifold, MD 09/15/19 2250

## 2019-09-16 ENCOUNTER — Encounter (HOSPITAL_COMMUNITY): Payer: Self-pay | Admitting: Family Medicine

## 2019-09-16 ENCOUNTER — Observation Stay (HOSPITAL_COMMUNITY): Payer: Medicare Other

## 2019-09-16 DIAGNOSIS — I10 Essential (primary) hypertension: Secondary | ICD-10-CM | POA: Diagnosis present

## 2019-09-16 DIAGNOSIS — Z20822 Contact with and (suspected) exposure to covid-19: Secondary | ICD-10-CM | POA: Diagnosis present

## 2019-09-16 DIAGNOSIS — I6389 Other cerebral infarction: Secondary | ICD-10-CM | POA: Diagnosis not present

## 2019-09-16 DIAGNOSIS — Z79899 Other long term (current) drug therapy: Secondary | ICD-10-CM | POA: Diagnosis not present

## 2019-09-16 DIAGNOSIS — G8194 Hemiplegia, unspecified affecting left nondominant side: Secondary | ICD-10-CM | POA: Diagnosis present

## 2019-09-16 DIAGNOSIS — E669 Obesity, unspecified: Secondary | ICD-10-CM | POA: Diagnosis present

## 2019-09-16 DIAGNOSIS — E785 Hyperlipidemia, unspecified: Secondary | ICD-10-CM | POA: Diagnosis present

## 2019-09-16 DIAGNOSIS — K219 Gastro-esophageal reflux disease without esophagitis: Secondary | ICD-10-CM | POA: Diagnosis present

## 2019-09-16 DIAGNOSIS — F8081 Childhood onset fluency disorder: Secondary | ICD-10-CM | POA: Diagnosis present

## 2019-09-16 DIAGNOSIS — Z7984 Long term (current) use of oral hypoglycemic drugs: Secondary | ICD-10-CM | POA: Diagnosis not present

## 2019-09-16 DIAGNOSIS — I639 Cerebral infarction, unspecified: Principal | ICD-10-CM

## 2019-09-16 DIAGNOSIS — R29703 NIHSS score 3: Secondary | ICD-10-CM | POA: Diagnosis present

## 2019-09-16 DIAGNOSIS — J45909 Unspecified asthma, uncomplicated: Secondary | ICD-10-CM | POA: Diagnosis present

## 2019-09-16 DIAGNOSIS — Z6834 Body mass index (BMI) 34.0-34.9, adult: Secondary | ICD-10-CM | POA: Diagnosis not present

## 2019-09-16 DIAGNOSIS — E119 Type 2 diabetes mellitus without complications: Secondary | ICD-10-CM

## 2019-09-16 DIAGNOSIS — E1151 Type 2 diabetes mellitus with diabetic peripheral angiopathy without gangrene: Secondary | ICD-10-CM | POA: Diagnosis present

## 2019-09-16 DIAGNOSIS — Z833 Family history of diabetes mellitus: Secondary | ICD-10-CM | POA: Diagnosis not present

## 2019-09-16 DIAGNOSIS — E7800 Pure hypercholesterolemia, unspecified: Secondary | ICD-10-CM | POA: Diagnosis present

## 2019-09-16 DIAGNOSIS — R29810 Facial weakness: Secondary | ICD-10-CM | POA: Diagnosis present

## 2019-09-16 DIAGNOSIS — Z7982 Long term (current) use of aspirin: Secondary | ICD-10-CM | POA: Diagnosis not present

## 2019-09-16 DIAGNOSIS — Z8541 Personal history of malignant neoplasm of cervix uteri: Secondary | ICD-10-CM | POA: Diagnosis not present

## 2019-09-16 DIAGNOSIS — Z8673 Personal history of transient ischemic attack (TIA), and cerebral infarction without residual deficits: Secondary | ICD-10-CM

## 2019-09-16 DIAGNOSIS — M797 Fibromyalgia: Secondary | ICD-10-CM | POA: Diagnosis present

## 2019-09-16 DIAGNOSIS — Z87891 Personal history of nicotine dependence: Secondary | ICD-10-CM | POA: Diagnosis not present

## 2019-09-16 DIAGNOSIS — R1310 Dysphagia, unspecified: Secondary | ICD-10-CM | POA: Diagnosis present

## 2019-09-16 LAB — HEMOGLOBIN A1C
Hgb A1c MFr Bld: 7.1 % — ABNORMAL HIGH (ref 4.8–5.6)
Mean Plasma Glucose: 157.07 mg/dL

## 2019-09-16 LAB — CBC
HCT: 38.7 % (ref 36.0–46.0)
Hemoglobin: 12.2 g/dL (ref 12.0–15.0)
MCH: 29.4 pg (ref 26.0–34.0)
MCHC: 31.5 g/dL (ref 30.0–36.0)
MCV: 93.3 fL (ref 80.0–100.0)
Platelets: 271 10*3/uL (ref 150–400)
RBC: 4.15 MIL/uL (ref 3.87–5.11)
RDW: 14.9 % (ref 11.5–15.5)
WBC: 6.1 10*3/uL (ref 4.0–10.5)
nRBC: 0 % (ref 0.0–0.2)

## 2019-09-16 LAB — RAPID URINE DRUG SCREEN, HOSP PERFORMED
Amphetamines: NOT DETECTED
Barbiturates: NOT DETECTED
Benzodiazepines: NOT DETECTED
Cocaine: NOT DETECTED
Opiates: NOT DETECTED
Tetrahydrocannabinol: NOT DETECTED

## 2019-09-16 LAB — GLUCOSE, CAPILLARY
Glucose-Capillary: 111 mg/dL — ABNORMAL HIGH (ref 70–99)
Glucose-Capillary: 168 mg/dL — ABNORMAL HIGH (ref 70–99)
Glucose-Capillary: 219 mg/dL — ABNORMAL HIGH (ref 70–99)
Glucose-Capillary: 129 mg/dL — ABNORMAL HIGH (ref 70–99)

## 2019-09-16 LAB — LIPID PANEL
Cholesterol: 117 mg/dL (ref 0–200)
HDL: 51 mg/dL (ref >40)
LDL Cholesterol: 53 mg/dL (ref 0–99)
Total CHOL/HDL Ratio: 2.3 RATIO
Triglycerides: 65 mg/dL (ref <150)
VLDL: 13 mg/dL (ref 0–40)

## 2019-09-16 LAB — CREATININE, SERUM
Creatinine, Ser: 0.81 mg/dL (ref 0.44–1.00)
GFR calc Af Amer: >60 mL/min (ref >60)
GFR calc non Af Amer: >60 mL/min (ref >60)

## 2019-09-16 LAB — HIV ANTIBODY (ROUTINE TESTING W REFLEX): HIV Screen 4th Generation wRfx: NONREACTIVE

## 2019-09-16 LAB — ECHOCARDIOGRAM COMPLETE
Height: 62 in
Weight: 3024 oz

## 2019-09-16 MED ORDER — ASPIRIN 300 MG RE SUPP: 300.0000 mg | Freq: Every day | RECTAL | Status: DC

## 2019-09-16 MED ORDER — HYDRALAZINE HCL 20 MG/ML IJ SOLN: 10.0000 mg | INTRAMUSCULAR | Status: DC | PRN

## 2019-09-16 MED ORDER — STROKE: EARLY STAGES OF RECOVERY BOOK
Freq: Once | Status: AC
  Filled 2019-09-16: qty 1

## 2019-09-16 MED ORDER — ACETAMINOPHEN 325 MG PO TABS: 650.0000 mg | ORAL_TABLET | ORAL | Status: DC | PRN

## 2019-09-16 MED ORDER — ACETAMINOPHEN 160 MG/5ML PO SOLN: 650.0000 mg | ORAL | Status: DC | PRN

## 2019-09-16 MED ORDER — INSULIN ASPART 100 UNIT/ML ~~LOC~~ SOLN
0.0000 [IU] | Freq: Three times a day (TID) | SUBCUTANEOUS | Status: DC
  Administered 2019-09-16: 3 [IU] via SUBCUTANEOUS
  Administered 2019-09-16: 2 [IU] via SUBCUTANEOUS
  Administered 2019-09-17 (×2): 1 [IU] via SUBCUTANEOUS

## 2019-09-16 MED ORDER — ACETAMINOPHEN 650 MG RE SUPP: 650.0000 mg | RECTAL | Status: DC | PRN

## 2019-09-16 MED ORDER — SODIUM CHLORIDE 0.9 % IV SOLN: INTRAVENOUS | Status: AC

## 2019-09-16 MED ORDER — IOHEXOL 350 MG/ML SOLN
75.0000 mL | Freq: Once | INTRAVENOUS | Status: AC | PRN
  Administered 2019-09-16: 75 mL via INTRAVENOUS

## 2019-09-16 MED ORDER — METOPROLOL SUCCINATE ER 50 MG PO TB24
50.0000 mg | ORAL_TABLET | Freq: Every day | ORAL | Status: DC
  Administered 2019-09-16 – 2019-09-17 (×2): 50 mg via ORAL
  Filled 2019-09-16 (×2): qty 1

## 2019-09-16 MED ORDER — ATORVASTATIN CALCIUM 80 MG PO TABS
80.0000 mg | ORAL_TABLET | Freq: Every day | ORAL | Status: DC
  Administered 2019-09-16: 17:00:00 80 mg via ORAL
  Filled 2019-09-16: qty 1

## 2019-09-16 MED ORDER — ENOXAPARIN SODIUM 40 MG/0.4ML ~~LOC~~ SOLN
40.0000 mg | SUBCUTANEOUS | Status: DC
  Administered 2019-09-16: 09:00:00 40 mg via SUBCUTANEOUS
  Filled 2019-09-16: qty 0.4

## 2019-09-16 MED ORDER — ASPIRIN 325 MG PO TABS
325.0000 mg | ORAL_TABLET | Freq: Every day | ORAL | Status: DC
  Administered 2019-09-16 – 2019-09-17 (×2): 325 mg via ORAL
  Filled 2019-09-16 (×2): qty 1

## 2019-09-16 NOTE — Progress Notes (Signed)
Received patient to room 3W09 from [facility] via EMS. Assisted to bed and positioned for comfort. Oriented to room, bed and unit. In no acute distress.

## 2019-09-16 NOTE — Evaluation (Addendum)
Physical Therapy Evaluation Patient Details Name: Krystal Vargas MRN: [id number] DOB: [birth year] Today's Date: 09/16/2019   History of Present Illness  64 y.o. female past medical history of hypertension, hyperlipidemia, diabetes, prior small vessel etiology right frontal subcortical infarct in September 2020 with no significant residual deficits, multiple chronic microhemorrhages seen on MRI brain at the time of her last stroke, chronic headaches, history of cervical cancer status post hysterectomy in 2018, fibromyalgia, presented to [facility] with complaints of left facial and leg numbness. MRI revealed 2 subcentimeter infarcts adjacent to the right lateral ventricle posterior horn.    Clinical Impression  Pt admitted with above diagnosis. PTA pt lived in handicap accessible apartment. 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 On eval, she required supervision tranfers and min guard assist ambulation 200' without AD. Pt demonstrates slow, steady gait with decreased stance time on LLE. LLE strength appears intact and symmetrical. Mild balance deficits noted. Pt's speech with stutter-like quality during eval. However, pt overheard speaking to other staff with a clear speech pattern.  Pt currently with functional limitations due to the deficits listed below (see PT Problem List). Pt will benefit from skilled PT to increase their independence and safety with mobility to allow discharge to the venue listed below.  Recommending rollator for pt to manage increased gait distances. Pt advised her children will be involved in assisting with the care of her husband from this time forward.     Follow Up Recommendations Home health PT;Supervision - Intermittent    Equipment Recommendations  Other (comment)(rollator)    Recommendations for Other Services       Precautions / Restrictions Precautions Precautions: Fall Restrictions Weight Bearing  Restrictions: No      Mobility  Bed Mobility Overal bed mobility: Modified Independent             General bed mobility comments: HOB elevated  Transfers Overall transfer level: Needs assistance Equipment used: None Transfers: Sit to/from Stand Sit to Stand: Supervision         General transfer comment: supervision for safety  Ambulation/Gait                Stairs            Wheelchair Mobility    Modified Rankin (Stroke Patients Only)       Balance Overall balance assessment: Needs assistance Sitting-balance support: No upper extremity supported;Feet supported Sitting balance-Leahy Scale: Good     Standing balance support: No upper extremity supported;During functional activity Standing balance-Leahy Scale: Good                               Pertinent Vitals/Pain Pain Assessment: No/denies pain    Home Living Family/patient expects to be discharged to:: Private residence Living Arrangements: Spouse/significant other Available Help at Discharge: Available PRN/intermittently;Family Type of Home: Apartment Home Access: Level entry     Home Layout: One level Home Equipment: Grab bars - tub/shower;Shower seat      Prior Function Level of Independence: Independent         Comments: 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 Pt reports spouse is blind in both eyes, hemiplegic, and has cognitive deficits. Endorses feeling overwhelmed in her caregiver role. Has reached out to [facility] and [facility] for information on their resources.      Hand Dominance  Dominant Hand: Right    Extremity/Trunk Assessment   Upper Extremity Assessment Upper Extremity Assessment: Overall WFL for tasks assessed    Lower Extremity Assessment Lower Extremity Assessment: Defer to PT evaluation    Cervical / Trunk Assessment Cervical / Trunk Assessment: Normal  Communication   Communication: Expressive  difficulties  Cognition Arousal/Alertness: Awake/alert Behavior During Therapy: Flat affect;Anxious;WFL for tasks assessed/performed Overall Cognitive Status: Within Functional Limits for tasks assessed                                        General Comments      Exercises     Assessment/Plan    PT Assessment    PT Problem List         PT Treatment Interventions      PT Goals (Current goals can be found in the Care Plan section)  Acute Rehab PT Goals Patient Stated Goal: home. alleviate caregiver fatigue.    Frequency     Barriers to discharge        Co-evaluation               AM-PAC PT "6 Clicks" Mobility  Outcome Measure                  End of Session              Time:  -      Charges:              Aida Raider, PT  Office # [phone] Pager [phone]   Ilda Foil 09/16/2019, 1:56 PM

## 2019-09-16 NOTE — Evaluation (Signed)
Speech Language Pathology Evaluation Patient Details Name: Krystal Vargas MRN: [id number] DOB: [birth year] Today's Date: 09/16/2019 Time: 6962-9528 SLP Time Calculation (min) (ACUTE ONLY): 15 min  Problem List:  Patient Active Problem List   Diagnosis Date Noted  . Acute CVA (cerebrovascular accident) (HCC) 09/16/2019  . Stroke (cerebrum) (HCC) 09/16/2019  . CVA (cerebral vascular accident) (HCC) 02/05/2019  . Acute ischemic stroke (HCC) 02/04/2019  . Obesity (BMI 30-39.9) 06/11/2018  . Chest pain 06/09/2018  . SOB (shortness of breath) 02/07/2017  . Chest pressure 02/07/2017  . Diabetes mellitus without complication (HCC) 02/07/2017  . GERD (gastroesophageal reflux disease) 02/07/2017  . Endometrial cancer (HCC) 02/07/2017  . Hypokalemia 02/07/2017  . Left arm pain 02/07/2017  . Hypertension   . Hyperlipidemia associated with type 2 diabetes mellitus (HCC)   . Asthma    Past Medical History:  Past Medical History:  Diagnosis Date  . Asthma   . Cancer (HCC)   . Diabetes mellitus   . Fibromyalgia   . High cholesterol   . Hypertension   . Stroke [facility])    Past Surgical History:  Past Surgical History:  Procedure Laterality Date  . TUBAL LIGATION     HPI:  64 year old with above past medical history presenting for evaluation of at least 2 days worth of left leg sensory symptoms and today also noticing some left facial numbness. She had a small vessel stroke in the right frontal lobe more anteriorly and superiorly in September 2020.  Concern for early onset cerebral amyloid angiopathy.  MRI examination of the brain-2 subcentimeter infarcts adjacent to the right lateral ventricle posterior horn.   Assessment / Plan / Recommendation Clinical Impression  Pt participated in speech/language evaluation with her daughter present for part of the session. Both parties denied the pt having any baseline speech, language, or cognitive-linguistic deficits. Pt denied any acute changes in  language or cognition but stated that she "can't get her words out". Her language skills are currently within normal limits and no overt cognitive-linguistic deficits were noted. However, she presented with stuttering characterized by blocks with perceived tension, sound prolongations (e.g., "sssome"), sound repetition (e.g., "chchcheck"), part-word repetitions (e.g., "oat oatmeal") and whole-word repetitions (e.g., "check check my"). As is typical of of stuttering, periods of improved fluency were noted during production of more automatic speech within social contexts. Skilled SLP services are clinically indicated at this time to target fluency.     SLP Assessment  SLP Recommendation/Assessment: Patient needs continued Speech Lanaguage Pathology Services SLP Visit Diagnosis: Other (comment)(Stuttering)    Follow Up Recommendations  Home health SLP(If pt's symptoms persist)    Frequency and Duration min 2x/week  2 weeks      SLP Evaluation Cognition  Overall Cognitive Status: Within Functional Limits for tasks assessed Arousal/Alertness: Awake/alert Orientation Level: Oriented X4 Attention: Focused;Sustained Focused Attention: Appears intact Sustained Attention: Appears intact Memory: Appears intact Awareness: Appears intact Problem Solving: Appears intact Executive Function: Reasoning Reasoning: Appears intact       Comprehension  Auditory Comprehension Overall Auditory Comprehension: Appears within functional limits for tasks assessed Yes/No Questions: Within Functional Limits Basic Immediate Environment Questions: (5/5) Complex Questions: (5/5) Paragraph Comprehension (via yes/no questions): (3/4) Commands: Within Functional Limits Two Step Basic Commands: (4/4) Multistep Basic Commands: (3/3) Conversation: Complex    Expression Expression Primary Mode of Expression: Verbal Verbal Expression Overall Verbal Expression: Appears within functional limits for tasks  assessed Initiation: Impaired Automatic Speech: Day of week;Counting;Month of year(With increased effort) Level of Generative/Spontaneous Verbalization:  Conversation Repetition: Impaired Level of Impairment: Sentence level(Impaired fluency ) Naming: No impairment Pragmatics: No impairment   Oral / Motor  Oral Motor/Sensory Function Overall Oral Motor/Sensory Function: Within functional limits Motor Speech Overall Motor Speech: Appears within functional limits for tasks assessed Respiration: Within functional limits Phonation: Normal Resonance: Within functional limits Articulation: Within functional limitis Intelligibility: Intelligible Motor Planning: Witnin functional limits Motor Speech Errors: Not applicable   Burrell Hodapp I. Vear Clock, MS, CCC-SLP Acute Rehabilitation Services Office number [phone] Pager [phone]                     Scheryl Marten 09/16/2019, 5:58 PM

## 2019-09-16 NOTE — Progress Notes (Signed)
Patient seen and examined.  Admitted early morning hours by nighttime hospitalist.  She has a history of hypertension and hyperlipidemia and diabetes.  She had a subcortical infarct in September 2020 with no significant residual deficits.  Treated with aspirin.  Presented to the [place] with complaints of left facial and leg numbness.  MRI revealed 2 subcentimeter infarcts right lateral ventricle posterior horn. Patient is complaining of stuttering, she does have mild left facial droop. Stroke work-up in progress. Given significant symptoms, acute stroke and need for inpatient investigations and studies, she will need to stay in the hospital tonight.

## 2019-09-16 NOTE — Evaluation (Addendum)
Occupational Therapy Evaluation Patient Details Name: Krystal Vargas MRN: [id number] DOB: 12/01/1955 Today's Date: 09/16/2019    History of Present Illness 64 y.o. female past medical history of hypertension, hyperlipidemia, diabetes, prior small vessel etiology right frontal subcortical infarct in September 2020 with no significant residual deficits, multiple chronic microhemorrhages seen on MRI brain at the time of her last stroke, chronic headaches, history of cervical cancer status post hysterectomy in 2018, fibromyalgia, presented to [facility] with complaints of left facial and leg numbness. MRI revealed 2 subcentimeter infarcts adjacent to the right lateral ventricle posterior horn.   Clinical Impression   Pt admitted with the above diagnoses and presents with below problem list. Pt will benefit from continued acute OT to address the below listed deficits and maximize independence with basic ADLs prior to d/c home. 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 Pt presents with slow, shuffled gait and flat, anxious affect. Discussed seeking out resources for spouse to help reduce burden of care as she endorses caregiver fatigue.      Follow Up Recommendations  Home health OT    Equipment Recommendations  None recommended by OT    Recommendations for Other Services       Precautions / Restrictions Precautions Precautions: Fall Restrictions Weight Bearing Restrictions: No      Mobility Bed Mobility Overal bed mobility: Modified Independent             General bed mobility comments: HOB elevated  Transfers Overall transfer level: Needs assistance Equipment used: None Transfers: Sit to/from Stand Sit to Stand: Supervision Stand pivot transfers: Supervision       General transfer comment: supervision for safety    Balance Overall balance assessment: Needs  assistance Sitting-balance support: No upper extremity supported;Feet supported Sitting balance-Leahy Scale: Good     Standing balance support: No upper extremity supported;During functional activity Standing balance-Leahy Scale: Good                             ADL either performed or assessed with clinical judgement   ADL Overall ADL's : Needs assistance/impaired Eating/Feeding: Set up;Sitting   Grooming: Oral care;Min guard;Standing   Upper Body Bathing: Set up;Sitting   Lower Body Bathing: Min guard;Sit to/from stand   Upper Body Dressing : Set up;Sitting   Lower Body Dressing: Min guard;Sit to/from stand   Toilet Transfer: Min guard;Ambulation   Toileting- Clothing Manipulation and Hygiene: Min guard;Sit to/from stand   Tub/ Shower Transfer: Min guard   Functional mobility during ADLs: Min guard General ADL Comments: Pt completed bed mobility, in-room functional mobility and stood at sink for grooming task. Slow, shuffled gait noted.      Vision         Perception     Praxis      Pertinent Vitals/Pain Pain Assessment: No/denies pain     Hand Dominance Right   Extremity/Trunk Assessment Upper Extremity Assessment Upper Extremity Assessment: Overall WFL for tasks assessed   Lower Extremity Assessment Lower Extremity Assessment: Defer to PT evaluation   Cervical / Trunk Assessment Cervical / Trunk Assessment: Normal   Communication Communication Communication: Expressive difficulties   Cognition Arousal/Alertness: Awake/alert Behavior During Therapy: Flat affect;Anxious;WFL for tasks assessed/performed Overall Cognitive Status: Within Functional Limits for tasks assessed  General Comments  VSS    Exercises     Shoulder Instructions      Home Living Family/patient expects to be discharged to:: Private residence Living Arrangements: Spouse/significant other Available Help at  Discharge: Available PRN/intermittently;Family Type of Home: Apartment Home Access: Level entry     Home Layout: One level     Bathroom Shower/Tub: Tub/shower unit;Walk-in shower   Bathroom Toilet: Handicapped height     Home Equipment: Grab bars - tub/shower;Shower seat          Prior Functioning/Environment Level of Independence: Independent        Comments: 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 Pt reports spouse is blind in both eyes, hemiplegic, and has cognitive deficits. Endorses feeling overwhelmed in her caregiver role. Has reached out to [facility] and [place] for information on their resources.         OT Problem List: Decreased activity tolerance;Impaired balance (sitting and/or standing);Decreased knowledge of use of DME or AE;Decreased knowledge of precautions      OT Treatment/Interventions: Self-care/ADL training;Therapeutic exercise;Energy conservation;DME and/or AE instruction;Therapeutic activities;Patient/family education;Balance training    OT Goals(Current goals can be found in the care plan section) Acute Rehab OT Goals Patient Stated Goal: home. alleviate caregiver fatigue. OT Goal Formulation: With patient Time For Goal Achievement: 09/30/19 Potential to Achieve Goals: Good ADL Goals Pt Will Perform Grooming: Independently;standing Pt Will Transfer to Toilet: with modified independence;ambulating Pt Will Perform Tub/Shower Transfer: with modified independence;ambulating  OT Frequency: Min 2X/week   Barriers to D/C:            Co-evaluation              AM-PAC OT "6 Clicks" Daily Activity     Outcome Measure Help from another person eating meals?: None Help from another person taking care of personal grooming?: None Help from another person toileting, which includes using toliet, bedpan, or urinal?: None Help from another person bathing (including washing, rinsing, drying)?: A Little Help from another  person to put on and taking off regular upper body clothing?: None Help from another person to put on and taking off regular lower body clothing?: None 6 Click Score: 23   End of Session Nurse Communication: Other (comment)(RN taking vitals at end of session)  Activity Tolerance: Patient tolerated treatment well Patient left: in bed;with call bell/phone within reach;with bed alarm set  OT Visit Diagnosis: Unsteadiness on feet (R26.81);Other symptoms and signs involving the nervous system (R29.898)                Time: 1610-9604 OT Time Calculation (min): 21 min Charges:  OT General Charges $OT Visit: 1 Visit OT Evaluation $OT Eval Low Complexity: 1 Low  Raynald Kemp, OT Acute Rehabilitation Services Pager: [phone] Office: [phone]   Pilar Grammes 09/16/2019, 12:26 PM

## 2019-09-16 NOTE — TOC Initial Note (Signed)
Transition of Care [facility]) - Initial/Assessment Note    Patient Details  Name: Traniece Bernd MRN: [id number] Date of Birth: 23-May-1955  [facility]) CM/SW Contact:    Kermit Balo, RN Phone Number: 09/16/2019, 2:14 PM  Clinical Narrative:                 Recommendations are for [facility] services. Pts spouse is currently receiving HH services through [facility] and patient would like to also use them. Drew with [facility] updated and accepted the referral. Pt with orders for rollator and this will be delivered to the room per [employer].  Pt states transportation may be an issue in the future. CM will provide her the application for [employer]. Pt has transportation home when medically ready.   Expected Discharge Plan: Home w Home Health Services Barriers to Discharge: Continued Medical Work up   Patient Goals and CMS Choice   CMS Medicare.gov Compare Post Acute Care list provided to:: Patient Choice offered to / list presented to : Patient  Expected Discharge Plan and Services Expected Discharge Plan: Home w Home Health Services   Discharge Planning Services: CM Consult Post Acute Care Choice: Home Health, Durable Medical Equipment Living arrangements for the past 2 months: Single Family Home                 DME Arranged: Walker rolling with seat DME Agency: [employer] Date DME Agency Contacted: 09/16/19   Representative spoke with at DME Agency: Zack HH Arranged: PT, OT, Social Work [employer] Agency: [facility] Date [facility] Agency Contacted: 09/16/19   Representative spoke with at [facility] Agency: Kenard Gower  Prior Living Arrangements/Services Living arrangements for the past 2 months: Single Family Home Lives with:: Spouse Patient language and need for interpreter reviewed:: Yes Do you feel safe going back to the place where you live?: Yes      Need for Family Participation in Patient Care: Yes (Comment) Care giver support system in place?: Yes (comment)(spouse and daughters)    Criminal Activity/Legal Involvement Pertinent to Current Situation/Hospitalization: No - Comment as needed  Activities of Daily Living Home Assistive Devices/Equipment: None ADL Screening (condition at time of admission) Patient's cognitive ability adequate to safely complete daily activities?: Yes Is the patient deaf or have difficulty hearing?: No Does the patient have difficulty seeing, even when wearing glasses/contacts?: No Does the patient have difficulty concentrating, remembering, or making decisions?: No Patient able to express need for assistance with ADLs?: Yes Does the patient have difficulty dressing or bathing?: No Independently performs ADLs?: Yes (appropriate for developmental age) Does the patient have difficulty walking or climbing stairs?: No Weakness of Legs: Left Weakness of Arms/Hands: None  Permission Sought/Granted                  Emotional Assessment Appearance:: Appears stated age Attitude/Demeanor/Rapport: Engaged Affect (typically observed): Accepting Orientation: : Oriented to Self, Oriented to Place, Oriented to  Time, Oriented to Situation   Psych Involvement: No (comment)  Admission diagnosis:  Acute ischemic stroke (HCC) [I63.9] Acute CVA (cerebrovascular accident) (HCC) [I63.9] Stroke (cerebrum) [facility]) [I63.9] Patient Active Problem List   Diagnosis Date Noted  . Acute CVA (cerebrovascular accident) (HCC) 09/16/2019  . Stroke (cerebrum) (HCC) 09/16/2019  . CVA (cerebral vascular accident) (HCC) 02/05/2019  . Acute ischemic stroke (HCC) 02/04/2019  . Obesity (BMI 30-39.9) 06/11/2018  . Chest pain 06/09/2018  . SOB (shortness of breath) 02/07/2017  . Chest pressure 02/07/2017  . Diabetes mellitus without complication (HCC)  02/07/2017  . GERD (gastroesophageal reflux disease) 02/07/2017  . Endometrial cancer (HCC) 02/07/2017  . Hypokalemia 02/07/2017  . Left arm pain 02/07/2017  . Hypertension   . Hyperlipidemia associated with type  2 diabetes mellitus (HCC)   . Asthma    PCP:  Heron Nay, PA Pharmacy:   [id number] - [place], [place] - [address] [place]  [address] Phone: [phone] Fax: [phone]     Social Determinants of Health (SDOH) Interventions    Readmission Risk Interventions No flowsheet data found.

## 2019-09-16 NOTE — Progress Notes (Signed)
Speech Language Pathology Treatment: Cognitive-Linquistic(Fluency)  Patient Details Name: Krystal Vargas MRN: [id number] DOB: Jan 04, 1956 Today's Date: 09/16/2019 Time: 5784-6962 SLP Time Calculation (min) (ACUTE ONLY): 18 min  Assessment / Plan / Recommendation Clinical Impression  Pt was seen for treatment and was cooperative throughout the session. Pt and her daughter were educated regarding the results of the evaluation. She was educated on and fluency shaping techniques and verbalized understanding. She used easy onset at the phrase level with 70% accuracy increasing to 100% with verbal prompts. She used light articulatory contacts at the 5-7-word sentence level with 100% accuracy. She was educated regarding the stuttering modification strategy of Pull Out but required intermittent verbal prompts to consistently use this during conversation. Pt's overall fluency was improved at the end of the session and she may not need continued SLP services following discharge if she continues to demonstrate this level of progress. SLP will continue to follow pt.    HPI HPI: 64 year old with above past medical history presenting for evaluation of at least 2 days worth of left leg sensory symptoms and today also noticing some left facial numbness. She had a small vessel stroke in the right frontal lobe more anteriorly and superiorly in September 2020.  Concern for early onset cerebral amyloid angiopathy.  MRI examination of the brain-2 subcentimeter infarcts adjacent to the right lateral ventricle posterior horn.      SLP Plan  Continue with current plan of care  Patient needs continued Speech Lanaguage Pathology Services    Recommendations                   Follow up Recommendations: Home health SLP(If pt's symptoms persist) SLP Visit Diagnosis: Other (comment)(Stuttering) Plan: Continue with current plan of care       Zetha Kuhar I. Vear Clock, MS, CCC-SLP Acute Rehabilitation Services Office  number [phone] Pager [phone]                Scheryl Marten 09/16/2019, 6:14 PM

## 2019-09-16 NOTE — Progress Notes (Signed)
  Echocardiogram 2D Echocardiogram has been performed.  Darlina Sicilian M 09/16/2019, 9:06 AM

## 2019-09-16 NOTE — H&P (Signed)
History and Physical    Angline Prestidge OHY:[id number] DOB: 09/15/1955 DOA: 09/15/2019  PCP: Heron Nay, PA  Patient coming from: Home.  Chief Complaint: Left facial numbness.    HPI: Krystal Vargas is a 64 y.o. female with history of CVA admitted in September 2020 at the time patient had some resultant dysarthria from the CVA presents to the ER at [facility] after patient started noticing left facial numbness and left lower extremity numbness.  Patient states she went to take a nap around 12 noon yesterday and woke up around 2 PM with numbness of the left face and gradually started developing numbness of the left lower extremity.  Patient was taken to the ER late in the evening.  Patient denies any weakness of the upper or lower extremity any visual symptoms difficulty swallowing.  Patient had gone to her neurologist 2 days ago for right sided headache which has worsened over the last 2 days and has been chronic.  ED Course: In the ER CT head shows hypodensity concerning for acute stroke in the right pons.  EKG shows normal sinus rhythm.  Since patient was out of the TPA window patient admitted for further management.  On my exam patient appears nonfocal except for having dysarthria from previous stroke able to move all extremities.  EKG shows normal sinus rhythm.  Labs are showing potassium 3.3 CBC unremarkable patient passed swallow Covid test negative.  Review of Systems: As per HPI, rest all negative.   Past Medical History:  Diagnosis Date  . Asthma   . Cancer (HCC)   . Diabetes mellitus   . Fibromyalgia   . High cholesterol   . Hypertension   . Stroke [facility])     Past Surgical History:  Procedure Laterality Date  . TUBAL LIGATION       reports that she has quit smoking. She has never used smokeless tobacco. She reports current alcohol use. She reports that she does not use drugs.  Allergies  Allergen Reactions  . Doxycycline     Chest Pain     Family History    Problem Relation Age of Onset  . Hypertension Mother   . Diabetes Mellitus II Mother   . Bronchitis Father   . Hypertension Sister   . Diabetes Mellitus II Sister   . Hypertension Brother   . Diabetes Mellitus II Brother     Prior to Admission medications   Medication Sig Start Date End Date Taking? Authorizing Provider  amLODipine (NORVASC) 10 MG tablet Take by mouth. 07/20/19  Yes [provider]  atorvastatin (LIPITOR) 80 MG tablet Take by mouth. 08/12/19  Yes [provider]  metFORMIN (GLUCOPHAGE-XR) 500 MG 24 hr tablet Take by mouth. 08/12/19  Yes [provider]  omeprazole (PRILOSEC) 40 MG capsule TAKE 1 CAPSULE BY MOUTH  DAILY 08/10/19  Yes [provider]  aspirin EC 325 MG EC tablet Take 1 tablet (325 mg total) by mouth daily. 02/06/19   Joseph Art, DO  atorvastatin (LIPITOR) 80 MG tablet Take 1 tablet (80 mg total) by mouth daily at 6 PM. 02/05/19   Marlin Canary U, DO  glipiZIDE (GLUCOTROL XL) 5 MG 24 hr tablet Take 5 mg by mouth daily with breakfast.    [provider]  hydrochlorothiazide (HYDRODIURIL) 25 MG tablet Take 25 mg by mouth daily. 08/17/19   [provider]  losartan-hydrochlorothiazide (HYZAAR) 100-25 MG tablet Take 1 tablet by mouth daily after breakfast. 11/23/16  [provider]  metFORMIN (GLUMETZA) 500 MG (MOD) 24 hr tablet Take 2 tablets (1,000 mg total) by mouth 2 (two) times daily with a meal. 02/07/19   Marlin Canary U, DO  metoprolol succinate (TOPROL-XL) 50 MG 24 hr tablet Take 50 mg by mouth daily. 08/09/19   [provider]  Multiple Vitamins-Minerals (MULTIVITAMIN GUMMIES WOMENS PO) Take 1 capsule by mouth daily.    [provider]  nitroGLYCERIN (NITROSTAT) 0.4 MG SL tablet Place 1 tablet (0.4 mg total) under the tongue every 5 (five) minutes as needed for chest pain. 06/10/18   Clydie Braun, MD  omeprazole (PRILOSEC) 40 MG capsule Take 40 mg by mouth daily.    [provider]  Polyethyl Glycol-Propyl Glycol (SYSTANE OP) Place 1 drop into both eyes daily as needed (Dry eyes).    [provider]    Physical Exam: Constitutional: Moderately built and nourished. Vitals:   09/15/19 2129 09/15/19 2149 09/15/19 2300 09/16/19 0100  BP: (!) 173/87  (!) 166/69 (!) 158/69  Pulse: 80 74 65 (!) 48  Resp: 19 (!) 22 17 20   Temp:    98.3 F (36.8 C)  TempSrc:    Oral  SpO2: 96% 99% 97% 98%  Weight:      Height:       Eyes: Anicteric no pallor. ENMT: No discharge from the ears eyes nose or mouth. Neck: No mass felt.  No neck rigidity. Respiratory: No rhonchi or crepitations. Cardiovascular: S1-S2 heard. Abdomen: Soft nontender bowel sounds present. Musculoskeletal: No edema. Skin: No rash. Neurologic: Alert awake oriented to time place and person.  Moves all extremities 5 x 5.  No facial asymmetry tongue is midline pupils equal reacting to light. Psychiatric: Appears normal global affect.   Labs on Admission: I have personally reviewed following labs and imaging studies  CBC: Recent Labs  Lab 09/15/19 2101  WBC 7.3  NEUTROABS 4.1  HGB 13.3  HCT 40.2  MCV 91.2  PLT 287   Basic Metabolic Panel: Recent Labs  Lab 09/15/19 2101  NA 140  K 3.3*  CL 101  CO2 29  GLUCOSE 165*  BUN 15  CREATININE 0.73  CALCIUM 9.6   GFR: Estimated Creatinine Clearance: 71.2 mL/min (by C-G formula based on SCr of 0.73 mg/dL). Liver Function Tests: No results for input(s): AST, ALT, ALKPHOS, BILITOT, PROT, ALBUMIN in the last 168 hours. No results for input(s): LIPASE, AMYLASE in the last 168 hours. No results for input(s): AMMONIA in the last 168 hours. Coagulation Profile: No results for input(s): INR, PROTIME in the last 168 hours. Cardiac Enzymes: No results for input(s): CKTOTAL, CKMB, CKMBINDEX, TROPONINI in the last 168 hours. BNP (last 3 results) No results for input(s): PROBNP in the last 8760 hours. HbA1C: No results for input(s):  HGBA1C in the last 72 hours. CBG: No results for input(s): GLUCAP in the last 168 hours. Lipid Profile: No results for input(s): CHOL, HDL, LDLCALC, TRIG, CHOLHDL, LDLDIRECT in the last 72 hours. Thyroid Function Tests: No results for input(s): TSH, T4TOTAL, FREET4, T3FREE, THYROIDAB in the last 72 hours. Anemia Panel: No results for input(s): VITAMINB12, FOLATE, FERRITIN, TIBC, IRON, RETICCTPCT in the last 72 hours. Urine analysis:    Component Value Date/Time   COLORURINE YELLOW 07/18/2019 1636   APPEARANCEUR CLEAR 07/18/2019 1636   LABSPEC 1.020 07/18/2019 1636   PHURINE 7.0 07/18/2019 1636   GLUCOSEU NEGATIVE 07/18/2019 1636   HGBUR NEGATIVE 07/18/2019 1636   BILIRUBINUR NEGATIVE 07/18/2019 1636  KETONESUR NEGATIVE 07/18/2019 1636   PROTEINUR NEGATIVE 07/18/2019 1636   NITRITE NEGATIVE 07/18/2019 1636   LEUKOCYTESUR NEGATIVE 07/18/2019 1636   Sepsis Labs: @LABRCNTIP (procalcitonin:4,lacticidven:4) ) Recent Results (from the past 240 hour(s))  Respiratory Panel by RT PCR (Flu A&B, Covid) - Nasopharyngeal Swab     Status: None   Collection Time: 09/15/19  9:01 PM   Specimen: Nasopharyngeal Swab  Result Value Ref Range Status   SARS Coronavirus 2 by RT PCR NEGATIVE NEGATIVE Final    Comment: (NOTE) SARS-CoV-2 target nucleic acids are NOT DETECTED. The SARS-CoV-2 RNA is generally detectable in upper respiratoy specimens during the acute phase of infection. The lowest concentration of SARS-CoV-2 viral copies this assay can detect is 131 copies/mL. A negative result does not preclude SARS-Cov-2 infection and should not be used as the sole basis for treatment or other patient management decisions. A negative result may occur with  improper specimen collection/handling, submission of specimen other than nasopharyngeal swab, presence of viral mutation(s) within the areas targeted by this assay, and inadequate number of viral copies (<131 copies/mL). A negative result must be  combined with clinical observations, patient history, and epidemiological information. The expected result is Negative. Fact Sheet for Patients:  https://www.moore.com/ Fact Sheet for Healthcare Providers:  https://www.young.biz/ This test is not yet ap proved or cleared by the [place] FDA and  has been authorized for detection and/or diagnosis of SARS-CoV-2 by FDA under an Emergency Use Authorization (EUA). This EUA will remain  in effect (meaning this test can be used) for the duration of the COVID-19 declaration under Section 564(b)(1) of the Act, 21 U.S.C. section 360bbb-3(b)(1), unless the authorization is terminated or revoked sooner.    Influenza A by PCR NEGATIVE NEGATIVE Final   Influenza B by PCR NEGATIVE NEGATIVE Final    Comment: (NOTE) The Xpert Xpress SARS-CoV-2/FLU/RSV assay is intended as an aid in  the diagnosis of influenza from Nasopharyngeal swab specimens and  should not be used as a sole basis for treatment. Nasal washings and  aspirates are unacceptable for Xpert Xpress SARS-CoV-2/FLU/RSV  testing. Fact Sheet for Patients: https://www.moore.com/ Fact Sheet for Healthcare Providers: https://www.young.biz/ This test is not yet approved or cleared by the [place] FDA and  has been authorized for detection and/or diagnosis of SARS-CoV-2 by  FDA under an Emergency Use Authorization (EUA). This EUA will remain  in effect (meaning this test can be used) for the duration of the  Covid-19 declaration under Section 564(b)(1) of the Act, 21  U.S.C. section 360bbb-3(b)(1), unless the authorization is  terminated or revoked. Performed at [facility], [address] [place], [place] [address]      Radiological Exams on Admission: CT Head Wo Contrast  Result Date: 09/15/2019 CLINICAL DATA:  Focal neuro deficit, greater than 6 hours, stroke suspected. Additional history  provided: Patient reports headache, right temporal/parietal to right side of neck since May 2nd, dizziness for 2 days, patient reports awaking from a nap at 2 p.m. today with numbness to left side of face and neck. EXAM: CT HEAD WITHOUT CONTRAST TECHNIQUE: Contiguous axial images were obtained from the base of the skull through the vertex without intravenous contrast. COMPARISON:  Noncontrast head CT 07/18/2019, brain MRI 02/04/2019. FINDINGS: Brain: There is a subcentimeter hypodensity within the right pons which was not present on head CT 07/18/2019 or brain MRI 02/04/2019 and is suspicious for acute or early subacute lacunar infarct (series 2, images 8 and 9). Stable mild to moderate  ill-defined hypoattenuation within the cerebral white matter which is nonspecific, but consistent with chronic small vessel ischemic disease. Known chronic lacunar infarcts in the bilateral cerebral white matter and thalami, some of which were better appreciated on MRI 02/04/2019. Cerebral volume is normal for age. There is no acute intracranial hemorrhage. No demarcated cortical infarct. No extra-axial fluid collection. No evidence of intracranial mass. No midline shift. Vascular: No hyperdense vessel. Skull: Normal. Negative for fracture or focal lesion. Sinuses/Orbits: Visualized orbits show no acute finding. Mild ethmoid sinus mucosal thickening. No significant mastoid effusion. IMPRESSION: 1. Subcentimeter hypodensity within the right pons which is new as compared to head CT 07/18/2019 and suspicious for acute or early subacute lacunar infarct. Consider brain MRI for further evaluation. 2. Stable background mild-to-moderate chronic small vessel changes. Known chronic lacunar infarcts within the cerebral white matter and thalami. 3. Mild ethmoid sinus mucosal thickening. Electronically Signed   By: Jackey Loge DO   On: 09/15/2019 21:52    EKG: Independently reviewed.  Normal sinus rhythm.  Assessment/Plan Principal  Problem:   Acute CVA (cerebrovascular accident) [facility]) Active Problems:   Hypertension   Asthma   Diabetes mellitus without complication (HCC)   Acute ischemic stroke (HCC)    1. Acute CVA -patient symptoms are concerning for acute CVA.  MRI brain has been ordered discussed with neurologist will be seeing patient in consult CT angiogram of the head and neck has been ordered.  2D echo neurochecks check hemoglobin A1c lipid panel patient has passed swallow evaluation.  Physical therapy consult. 2. Diabetes mellitus type 2 we will keep patient on sliding scale coverage. 3. Hypertension we will avoid antihypertensives for now allowing for permissive hypertension.  As needed IV hydralazine for systolic blood pressure more than 220 and diastolic more than 120. 4. Headaches will await neurology recommendations. 5. Hyperlipidemia on statins.   DVT prophylaxis: Lovenox. Code Status: Full code. Family Communication: Discussed with patient. Disposition Plan: Home. Consults called: Neurology. Admission status: Observation.   Eduard Clos MD [facility] Pager [phone].  If 7PM-7AM, please contact night-coverage [email or web address] Password [email or web address]  09/16/2019, 1:08 AM

## 2019-09-16 NOTE — Progress Notes (Signed)
STROKE TEAM PROGRESS NOTE   INTERVAL HISTORY Pt lying in bed, no family at bedside. She stated that her left sided numbness much improved from yesterday, denies left sided weakness. She has functional speech pattern she said developed in ED yesterday, distractable. CTA head and neck stable from last 01/2019.   Vitals:   09/16/19 0100 09/16/19 0538 09/16/19 0745 09/16/19 1210  BP: (!) 158/69 (!) 162/77 (!) 147/80 (!) 191/86  Pulse: (!) 48 (!) 56 (!) 54 (!) 47  Resp: 20 20 16 20   Temp: 98.3 F (36.8 C) (!) 97.4 F (36.3 C) 97.8 F (36.6 C) 98.9 F (37.2 C)  TempSrc: Oral Oral Oral Oral  SpO2: 98% 99% 97% 100%  Weight:      Height:        CBC:  Recent Labs  Lab 09/15/19 2101 09/16/19 0423  WBC 7.3 6.1  NEUTROABS 4.1  --   HGB 13.3 12.2  HCT 40.2 38.7  MCV 91.2 93.3  PLT 287 [id number]    Basic Metabolic Panel:  Recent Labs  Lab 09/15/19 2101 09/16/19 0423  NA 140  --   K 3.3*  --   CL 101  --   CO2 29  --   GLUCOSE 165*  --   BUN 15  --   CREATININE 0.73 0.81  CALCIUM 9.6  --    Lipid Panel:     Component Value Date/Time   CHOL 117 09/16/2019 0423   TRIG 65 09/16/2019 0423   HDL 51 09/16/2019 0423   CHOLHDL 2.3 09/16/2019 0423   VLDL 13 09/16/2019 0423   LDLCALC 53 09/16/2019 0423   HgbA1c:  Lab Results  Component Value Date   HGBA1C 7.1 (H) 09/16/2019   Urine Drug Screen:     Component Value Date/Time   LABOPIA NONE DETECTED 09/16/2019 0904   COCAINSCRNUR NONE DETECTED 09/16/2019 0904   LABBENZ NONE DETECTED 09/16/2019 0904   AMPHETMU NONE DETECTED 09/16/2019 0904   THCU NONE DETECTED 09/16/2019 0904   LABBARB NONE DETECTED 09/16/2019 0904    Alcohol Level No results found for: ETH  IMAGING past 24 hours CT ANGIO HEAD W OR WO CONTRAST  Result Date: 09/16/2019 CLINICAL DATA:  Stroke, follow-up. EXAM: CT ANGIOGRAPHY HEAD AND NECK TECHNIQUE: Multidetector CT imaging of the head and neck was performed using the standard protocol during bolus  administration of intravenous contrast. Multiplanar CT image reconstructions and MIPs were obtained to evaluate the vascular anatomy. Carotid stenosis measurements (when applicable) are obtained utilizing NASCET criteria, using the distal internal carotid diameter as the denominator. CONTRAST:  54mL OMNIPAQUE IOHEXOL 350 MG/ML SOLN COMPARISON:  Prior noncontrast head CT examinations 09/15/2019 and earlier, brain MRI 09/16/2019, CT angiogram head/neck 02/05/2019. FINDINGS: CTA NECK FINDINGS Aortic arch: Common origin of the innominate and left common carotid arteries. Atherosclerotic plaque within the visualized aortic arch and proximal major branch vessels of the neck. No hemodynamically significant innominate or proximal subclavian artery stenosis. Right carotid system: CCA and ICA patent within the neck without stenosis. No significant atherosclerotic disease. The cervical ICA is tortuous Left carotid system: CCA and ICA patent within the neck without stenosis. No significant atherosclerotic disease. The cervical ICA is tortuous. Vertebral arteries: Codominant. The vertebral arteries are tortuous but patent within the neck bilaterally without significant stenosis. Skeleton: No acute bony abnormality or aggressive osseous lesion. Cervical spondylosis with multilevel disc space narrowing, posterior disc osteophytes, uncovertebral and facet hypertrophy. Other neck: No neck mass or cervical lymphadenopathy. Thyroid unremarkable. Upper chest:  No consolidation within the imaged lung apices. Redemonstrated focal extrapleural fat along the left upper lobe. Review of the MIP images confirms the above findings CTA HEAD FINDINGS Anterior circulation: The intracranial internal carotid arteries are patent without significant stenosis minimal calcified plaque within these vessels. The M1 middle cerebral arteries are patent without significant stenosis. No M2 proximal branch occlusion or high-grade proximal stenosis is  identified. The anterior cerebral arteries are patent without significant proximal stenosis. No intracranial aneurysm is identified. Posterior circulation: As before, the right vertebral artery is developmentally diminutive beyond the origin of the right PICA, although patent. Unchanged from prior exam 02/05/2019, there is a focal linear defect with outpouching of contrast within the proximal left V4 segment which could reflect a small penetrating plaque or short-segment dissection (for instance as seen on series 7, image 145). Unchanged mild stenosis at this level. The basilar artery is patent without significant stenosis. Fetal origin left posterior cerebral artery. The posterior cerebral arteries are patent bilaterally without significant proximal stenosis. A sizable posterior communicating artery is also present on the right. Venous sinuses: Within limitations of contrast timing, no convincing thrombus. Anatomic variants: As described. Review of the MIP images confirms the above findings IMPRESSION: CTA neck: 1. The bilateral common carotid, internal carotid and vertebral arteries are patent within the neck without significant stenosis. 2. As before, there is tortuosity of the cervical internal carotid arteries and vertebral arteries suggestive of underlying hypertension. CTA head: 1. No intracranial large vessel occlusion or proximal high-grade arterial stenosis. 2. As before, the proximal V4 left vertebral artery demonstrates a short segment linear defect with contrast outpouching, which could reflect a small penetrating plaque or short segment dissection. Mild stenosis redemonstrated at this level. Electronically Signed   By: Kellie Simmering DO   On: 09/16/2019 08:45   CT Head Wo Contrast  Result Date: 09/15/2019 CLINICAL DATA:  Focal neuro deficit, greater than 6 hours, stroke suspected. Additional history provided: Patient reports headache, right temporal/parietal to right side of neck since May 2nd,  dizziness for 2 days, patient reports awaking from a nap at 2 p.m. today with numbness to left side of face and neck. EXAM: CT HEAD WITHOUT CONTRAST TECHNIQUE: Contiguous axial images were obtained from the base of the skull through the vertex without intravenous contrast. COMPARISON:  Noncontrast head CT 07/18/2019, brain MRI 02/04/2019. FINDINGS: Brain: There is a subcentimeter hypodensity within the right pons which was not present on head CT 07/18/2019 or brain MRI 02/04/2019 and is suspicious for acute or early subacute lacunar infarct (series 2, images 8 and 9). Stable mild to moderate ill-defined hypoattenuation within the cerebral white matter which is nonspecific, but consistent with chronic small vessel ischemic disease. Known chronic lacunar infarcts in the bilateral cerebral white matter and thalami, some of which were better appreciated on MRI 02/04/2019. Cerebral volume is normal for age. There is no acute intracranial hemorrhage. No demarcated cortical infarct. No extra-axial fluid collection. No evidence of intracranial mass. No midline shift. Vascular: No hyperdense vessel. Skull: Normal. Negative for fracture or focal lesion. Sinuses/Orbits: Visualized orbits show no acute finding. Mild ethmoid sinus mucosal thickening. No significant mastoid effusion. IMPRESSION: 1. Subcentimeter hypodensity within the right pons which is new as compared to head CT 07/18/2019 and suspicious for acute or early subacute lacunar infarct. Consider brain MRI for further evaluation. 2. Stable background mild-to-moderate chronic small vessel changes. Known chronic lacunar infarcts within the cerebral white matter and thalami. 3. Mild ethmoid sinus mucosal thickening. Electronically  Signed   By: Kellie Simmering DO   On: 09/15/2019 21:52   CT ANGIO NECK W OR WO CONTRAST  Result Date: 09/16/2019 CLINICAL DATA:  Stroke, follow-up. EXAM: CT ANGIOGRAPHY HEAD AND NECK TECHNIQUE: Multidetector CT imaging of the head and neck was  performed using the standard protocol during bolus administration of intravenous contrast. Multiplanar CT image reconstructions and MIPs were obtained to evaluate the vascular anatomy. Carotid stenosis measurements (when applicable) are obtained utilizing NASCET criteria, using the distal internal carotid diameter as the denominator. CONTRAST:  70mL OMNIPAQUE IOHEXOL 350 MG/ML SOLN COMPARISON:  Prior noncontrast head CT examinations 09/15/2019 and earlier, brain MRI 09/16/2019, CT angiogram head/neck 02/05/2019. FINDINGS: CTA NECK FINDINGS Aortic arch: Common origin of the innominate and left common carotid arteries. Atherosclerotic plaque within the visualized aortic arch and proximal major branch vessels of the neck. No hemodynamically significant innominate or proximal subclavian artery stenosis. Right carotid system: CCA and ICA patent within the neck without stenosis. No significant atherosclerotic disease. The cervical ICA is tortuous Left carotid system: CCA and ICA patent within the neck without stenosis. No significant atherosclerotic disease. The cervical ICA is tortuous. Vertebral arteries: Codominant. The vertebral arteries are tortuous but patent within the neck bilaterally without significant stenosis. Skeleton: No acute bony abnormality or aggressive osseous lesion. Cervical spondylosis with multilevel disc space narrowing, posterior disc osteophytes, uncovertebral and facet hypertrophy. Other neck: No neck mass or cervical lymphadenopathy. Thyroid unremarkable. Upper chest: No consolidation within the imaged lung apices. Redemonstrated focal extrapleural fat along the left upper lobe. Review of the MIP images confirms the above findings CTA HEAD FINDINGS Anterior circulation: The intracranial internal carotid arteries are patent without significant stenosis minimal calcified plaque within these vessels. The M1 middle cerebral arteries are patent without significant stenosis. No M2 proximal branch  occlusion or high-grade proximal stenosis is identified. The anterior cerebral arteries are patent without significant proximal stenosis. No intracranial aneurysm is identified. Posterior circulation: As before, the right vertebral artery is developmentally diminutive beyond the origin of the right PICA, although patent. Unchanged from prior exam 02/05/2019, there is a focal linear defect with outpouching of contrast within the proximal left V4 segment which could reflect a small penetrating plaque or short-segment dissection (for instance as seen on series 7, image 145). Unchanged mild stenosis at this level. The basilar artery is patent without significant stenosis. Fetal origin left posterior cerebral artery. The posterior cerebral arteries are patent bilaterally without significant proximal stenosis. A sizable posterior communicating artery is also present on the right. Venous sinuses: Within limitations of contrast timing, no convincing thrombus. Anatomic variants: As described. Review of the MIP images confirms the above findings IMPRESSION: CTA neck: 1. The bilateral common carotid, internal carotid and vertebral arteries are patent within the neck without significant stenosis. 2. As before, there is tortuosity of the cervical internal carotid arteries and vertebral arteries suggestive of underlying hypertension. CTA head: 1. No intracranial large vessel occlusion or proximal high-grade arterial stenosis. 2. As before, the proximal V4 left vertebral artery demonstrates a short segment linear defect with contrast outpouching, which could reflect a small penetrating plaque or short segment dissection. Mild stenosis redemonstrated at this level. Electronically Signed   By: Kellie Simmering DO   On: 09/16/2019 08:45   MR BRAIN WO CONTRAST  Result Date: 09/16/2019 CLINICAL DATA:  Right temporoparietal headache with right-sided neck pain. Dizziness for 2 days. EXAM: MRI HEAD WITHOUT CONTRAST TECHNIQUE: Multiplanar,  multiecho pulse sequences of the brain and surrounding  structures were obtained without intravenous contrast. COMPARISON:  02/04/2019 FINDINGS: Brain: 2 subcentimeter acute white matter infarcts lateral to the atrium of the right lateral ventricle. Extensive chronic small vessel ischemia with confluent gliosis in the periventricular white matter. Chronic lacunes at the right thalamus and in the bilateral deep white matter tracks. Brain volume is normal. Numerous chronic microhemorrhages affecting basal ganglia, brainstem, and the lobar brain. No acute hemorrhage Vascular: Preserved flow voids Skull and upper cervical spine: Normal marrow signal. Sinuses/Orbits: Negative. IMPRESSION: 1. Two subcentimeter acute white matter infarcts next to the atrium of the right lateral ventricle. 2. Extensive chronic small vessel disease. 3. Chronic microhemorrhages in locations seen with both hypertension and amyloid angiopathy. Electronically Signed   By: Monte Fantasia M.D.   On: 09/16/2019 05:41    PHYSICAL EXAM  Temp:  [97.4 F (36.3 C)-98.9 F (37.2 C)] 98.9 F (37.2 C) (05/06 1210) Pulse Rate:  [47-80] 47 (05/06 1210) Resp:  [15-26] 20 (05/06 1210) BP: (147-191)/(69-107) 191/86 (05/06 1210) SpO2:  [96 %-100 %] 100 % (05/06 1210) Weight:  [85.7 kg] 85.7 kg (05/05 1944)  General - Well nourished, well developed, in no apparent distress.  Ophthalmologic - fundi not visualized due to noncooperation.  Cardiovascular - Regular rhythm and rate.  Mental Status -  Level of arousal and orientation to time, place, and person were intact. Language including expression, naming, repetition, comprehension was assessed and found intact. Functional speech pattern with stuttering.   Cranial Nerves II - XII - II - Visual field intact OU. III, IV, VI - Extraocular movements intact. V - Facial sensation intact bilaterally. VII - Facial movement intact bilaterally. VIII - Hearing & vestibular intact bilaterally. X  - Palate elevates symmetrically. XI - Chin turning & shoulder shrug intact bilaterally. XII - Tongue protrusion intact  Motor Strength - The patient's strength was normal in all extremities and pronator drift was absent.  Bulk was normal and fasciculations were absent.   Motor Tone - Muscle tone was assessed at the neck and appendages and was normal.  Reflexes - The patient's reflexes were symmetrical in all extremities and she had no pathological reflexes.  Sensory - Light touch, temperature/pinprick were assessed and were decreased on the left, about 90% of the right.    Coordination - The patient had normal movements in the hands with no ataxia or dysmetria.  Tremor was absent.  Gait and Station - deferred.   ASSESSMENT/PLAN Ms. Krystal Vargas is a 65 y.o. female with history of stroke in 01/2019 w dysarthria, HTN, HLD, DB presenting with L facial and LLE numbness along with dysphagia.   Stroke:  right small infarct periventricular WM around occipital horn, secondary to small vessel disease source  CT head ?? new subcentimteter hypodensity R pons. Stable Small vessel disease. Old cerebral white matter and thalamic infarcts.   MRI  2 subcentimeter R lateral ventricle white matter infarcts. Extensive small vessel disease. Chronic microhemorrhages.   CTA head proximal L V4 outpouching possible plaque vs. dissection, mild stenosis, unchanged from 01/2019    CTA neck cervical ICA and VAs tortuous  2D Echo pending  LDL 53  HgbA1c 7.1  Lovenox 40 mg sq daily for VTE prophylaxis  aspirin 325 mg daily prior to admission, now on aspirin 325 mg daily. Continue asprin alone at d/c given multiple chronic microhemorrhages  Therapy recommendations:  HH OT, HH PT  Disposition:  pending   Hx stroke/TIA  01/2019 - admitted for left facial numbness and HA, MRI  showed right frontal lobe subcortical WM infarct secondary to small vessel disease source. CTA head and neck showed L V4 stenosis with  plaque vs. Dissection. EF 75%, LDL 86 and A1C 7.0, put on ASA 325 given multiple microhemorrhages and lipitor 80. No therapy needs.   MCBs  MRI 01/2019 showed numerous microbleeds both central and peripheral pattern  MRI 09/2019 similar appearance as before, concerning for HTN and CAA.  Against DAPT for brain bleeding risk  Continue ASA on discharge  Hypertension  Elevated on arrival  Stabilizing but stated fluctuating at home  Undergoing BP medication changes with PCP . Permissive hypertension (OK if < 220/120) but gradually normalize in 3-5 days . Long-term BP goal normotensive  Hyperlipidemia  Home meds:  lipitor 80, resumed in hospital  LDL 53, goal < 70  Continue statin at discharge  Diabetes type II Uncontrolled  HgbA1c 7.1, goal < 7.0  CBGs  SSI  Close PCP follow up  Other Stroke Risk Factors  Advanced age  Former Cigarette smoker  ETOH use, advised to drink no more than 1 drink(s) a day  Obesity, Body mass index is 34.57 kg/m., recommend weight loss, diet and exercise as appropriate   Other Active Problems  HAs - follows with neurology Dr. Lynnette Caffey.  Hospital day # 0  Neurology will sign off. Please call with questions. Pt will follow up with Dr. Lynnette Caffey in 4 weeks. Thanks for the consult.  Rosalin Hawking, MD PhD Stroke Neurology 09/16/2019 1:37 PM   To contact Stroke Continuity provider, please refer to [email or web address] After hours, contact General Neurology

## 2019-09-16 NOTE — Consult Note (Signed)
Neurology Consultation  Reason for Consult: Stroke Referring Physician: Dr. Hal Hope  CC: Facial numbness on the left, left leg numbness  History is obtained from: Patient, chart review  HPI: Krystal Vargas is a 64 y.o. female past medical history of hypertension, hyperlipidemia, diabetes, prior small vessel etiology right frontal subcortical infarct in September 2020 with no significant residual deficits, multiple chronic microhemorrhages seen on MRI brain at the time of her last stroke, chronic headaches, history of cervical cancer status post hysterectomy in 2018, fibromyalgia, presented to [facility] with complaints of left facial and leg numbness, with the facial numbness starting after her nap at noon on 09/15/2019 and the leg tingling been ongoing for a couple of days. She follows up with [facility] neurology as an outpatient and saw her neurologist on 09/14/2019, and says that she had been having a low-grade headache as well as left-sided tingling since that time.  This was likely attributed to her new antihypertensive medication that was started-amlodipine.  She did not make much of it and yesterday in the afternoon, went for a nap and then she woke up from her nap noticed that her speech was slightly off and her face felt numb. She also felt a little weaker on the left side of the body in comparison to the right-similar to what she had felt when she had her last stroke in September. She was seen and evaluated at [facility].  She was outside the window for IV TPA.  Noncontrast head CT at the outside facility was concerning for a new brainstem stroke and she was admitted to the hospitalist service.  An MRI was done which showed 2 subcentimeter acute white matter infarcts next to the atrium of the right lateral ventricle and redemonstrated chronic microhemorrhages seen in September 2020. She was sent home only on aspirin after her last stroke due to the presence of numerous  microhemorrhages and concern for early CAA. She reports compliance to medications but says that her blood pressures have been difficult to control. Also reports some financial concerns and obtaining blood pressure medications.  LKW: 2 days ago tpa given?: no, outside the window Premorbid modified Rankin scale (mRS):1  ROS: Review of systems performed and negative except as noted in HPI.  Past Medical History:  Diagnosis Date  . Asthma   . Cancer ([place])   . Diabetes mellitus   . Fibromyalgia   . High cholesterol   . Hypertension   . Stroke [facility])      Family History  Problem Relation Age of Onset  . Hypertension Mother   . Diabetes Mellitus II Mother   . Bronchitis Father   . Hypertension Sister   . Diabetes Mellitus II Sister   . Hypertension Brother   . Diabetes Mellitus II Brother    Social History:   reports that she has quit smoking. She has never used smokeless tobacco. She reports current alcohol use. She reports that she does not use drugs.  Medications  Current Facility-Administered Medications:  .  0.9 %  sodium chloride infusion, , Intravenous, Continuous, Rise Patience, MD, Last Rate: 75 mL/hr at 09/16/19 0214, New Bag at 09/16/19 0214 .  acetaminophen (TYLENOL) tablet 650 mg, 650 mg, Oral, Q4H PRN **OR** acetaminophen (TYLENOL) 160 MG/5ML solution 650 mg, 650 mg, Per Tube, Q4H PRN **OR** acetaminophen (TYLENOL) suppository 650 mg, 650 mg, Rectal, Q4H PRN, Rise Patience, MD .  aspirin suppository 300 mg, 300 mg, Rectal, Daily **OR** aspirin tablet  325 mg, 325 mg, Oral, Daily, Rise Patience, MD .  atorvastatin (LIPITOR) tablet 80 mg, 80 mg, Oral, q1800, Rise Patience, MD .  enoxaparin (LOVENOX) injection 40 mg, 40 mg, Subcutaneous, Q24H, Rise Patience, MD .  hydrALAZINE (APRESOLINE) injection 10 mg, 10 mg, Intravenous, Q4H PRN, Rise Patience, MD .  insulin aspart (novoLOG) injection 0-9 Units, 0-9 Units, Subcutaneous, TID  WC, Rise Patience, MD .  metoprolol succinate (TOPROL-XL) 24 hr tablet 50 mg, 50 mg, Oral, Daily, Rise Patience, MD   Exam: Current vital signs: BP (!) 162/77 (BP Location: Left Arm)   Pulse (!) 56   Temp (!) 97.4 F (36.3 C) (Oral)   Resp 20   Ht 5\' 2"  (1.575 m)   Wt 85.7 kg   LMP 05/16/2012   SpO2 99%   BMI 34.57 kg/m  Vital signs in last 24 hours: Temp:  [97.4 F (36.3 C)-98.6 F (37 C)] 97.4 F (36.3 C) (05/06 0538) Pulse Rate:  [48-80] 56 (05/06 0538) Resp:  [15-26] 20 (05/06 0538) BP: (150-190)/(69-107) 162/77 (05/06 0538) SpO2:  [96 %-99 %] 99 % (05/06 0538) Weight:  [85.7 kg] 85.7 kg (05/05 1944) General: Awake alert in no distress HEENT: Normocephalic atraumatic Lungs clear Cardiovascular: Regular rate rhythm Abdomen nondistended nontender Extremities warm well perfused Neurological exam Awake alert oriented x3 Her speech has a mild stuttering quality to it. There is no evidence of aphasia Cranial nerves: Pupils equal round reactive light, extraocular movements intact, visual field full, face appears grossly symmetric, facial sensation diminished on the left, auditory acuity intact, tongue and palate midline. Motor exam: There is no vertical drift but there is a subtle left arm weakness 4+/5-question some effort dependence.  Otherwise no vertical drift on left lower, right lower and right upper extremity. Sensory exam: Diminished on left face and left leg in comparison to the right. No extinction Coordination: No dysmetria NIH stroke scale 1a Level of Conscious.: 0 1b LOC Questions: 0 1c LOC Commands: 0 2 Best Gaze: 0 3 Visual: 0 4 Facial Palsy: 0 5a Motor Arm - left: 1 5b Motor Arm - Right: 0 6a Motor Leg - Left: 0 6b Motor Leg - Right: 0 7 Limb Ataxia: 0 8 Sensory: 1 9 Best Language: 0 10 Dysarthria: 1 11 Extinct. and Inatten.: 0 TOTAL: 3   Labs I have reviewed labs in epic and the results pertinent to this consultation  are: CBC    Component Value Date/Time   WBC 6.1 09/16/2019 0423   RBC 4.15 09/16/2019 0423   HGB 12.2 09/16/2019 0423   HCT 38.7 09/16/2019 0423   PLT 271 09/16/2019 0423   MCV 93.3 09/16/2019 0423   MCH 29.4 09/16/2019 0423   MCHC 31.5 09/16/2019 0423   RDW 14.9 09/16/2019 0423   LYMPHSABS 2.4 09/15/2019 2101   MONOABS 0.6 09/15/2019 2101   EOSABS 0.2 09/15/2019 2101   BASOSABS 0.0 09/15/2019 2101    CMP     Component Value Date/Time   NA 140 09/15/2019 2101   K 3.3 (L) 09/15/2019 2101   CL 101 09/15/2019 2101   CO2 29 09/15/2019 2101   GLUCOSE 165 (H) 09/15/2019 2101   BUN 15 09/15/2019 2101   CREATININE 0.81 09/16/2019 0423   CALCIUM 9.6 09/15/2019 2101   PROT 7.8 07/18/2019 1347   ALBUMIN 4.5 07/18/2019 1347   AST 30 07/18/2019 1347   ALT 14 07/18/2019 1347   ALKPHOS 65 07/18/2019 1347   BILITOT 0.9  07/18/2019 1347   GFRNONAA >60 09/16/2019 0423   GFRAA >60 09/16/2019 0423   Imaging I have reviewed the images obtained:  CT-scan of the brain-no acute changes.  Official reading concerning for subcentimeter hypodensity in the right pons, new from CT head of 07/18/2019 but likely artifact based on MRI findings.  MRI examination of the brain-2 subcentimeter infarcts adjacent to the right lateral ventricle posterior horn.  Assessment: 64 year old with above past medical history presenting for evaluation of at least 2 days worth of left leg sensory symptoms and today also noticing some left facial numbness. [facility] the [facility] [place]-outside the window for IV TPA. Exam not suggestive of LVO hence not a EVT candidate.  She had a small vessel stroke in the right frontal lobe more anteriorly and superiorly in September 2020.  She was started on aspirin and high intensity statin but not on dual antiplatelets given the number and severity of the chronic microhemorrhages seen on the SWI images with concern for early onset cerebral amyloid angiopathy.  She  continues to report ongoing headaches, and has new strokes, again small vessel etiology likely.   Impression: Acute ischemic stroke-likely small vessel etiology  Recommendations: Admit to hospitalist Telemetry Frequent neurochecks CT angiogram head and neck 2D echocardiogram Continue aspirin Continue statin PT OT Speech therapy Allow for permissive hypertension for about a day or so as symptoms have now been going on for about 48 hours.  Start normalizing blood pressures after that.  For now treat only if systolic blood pressures are greater than 220 on a as needed basis. Prior to discharge, goal blood pressure should be 140/90. Patient has had some problem obtaining her antihypertensive medications due to financial issues-would appreciate some social work input and ensuring that she has access to these medications.  Stroke team will follow with you  -- Amie Portland, MD [facility] Pager: [phone] If 7pm to 7am, please call on call as listed on AMION.

## 2019-09-17 LAB — GLUCOSE, CAPILLARY
Glucose-Capillary: 142 mg/dL — ABNORMAL HIGH (ref 70–99)
Glucose-Capillary: 143 mg/dL — ABNORMAL HIGH (ref 70–99)

## 2019-09-17 NOTE — Discharge Summary (Signed)
Physician Discharge Summary  Krystal Vargas [id number] DOB: 07/03/1955 DOA: 09/15/2019  PCP: Rich Fuchs, PA  Admit date: 09/15/2019 Discharge date: 09/17/2019  Admitted From: Home Disposition: Home with home health  Recommendations for Outpatient Follow-up:  1. Follow up with PCP in 1-2 weeks 2. Follow-up with your neurologist at [place]: Home health PT/OT/speech Equipment/Devices: Rollator  Discharge Condition: Stable CODE STATUS: Full code Diet recommendation: Low-salt low-carb diet  Discharge summary: 64 year old female with history of hypertension, hyperlipidemia and diabetes.  She had a subcortical infarct in September 2020 with no significant residual deficits.  She was treated with aspirin.  She also had suspected microhemorrhages so was not on dual antiplatelet therapy.  Presented to the emergency room with complaints of left facial and leg numbness and is stuttering.  She had the symptoms ongoing for at least last week.  She had seen her neurologist the day before.  MRI of the brain revealed 2 subcentimeter infarcts right lateral ventricle posterior horn.  On arrival to the hospital from emergency room, she was complaining of stuttering and no other acute findings.  Acute ischemic stroke: Right small infarct periventricular, secondary to small vessel disease. Clinical findings, stuttering, left hemiparesis. CT head findings, hypodense area in the right pons.  Small vessel disease.  Old cerebral white matter and thalamic infarcts. MRI of the brain, 2 subcentimeter right lateral ventricle white matter infarcts.  Extensive small vessel disease.  Chronic microhemorrhages. CTA of the head and neck, mild stenosis.  Unchanged from 01/2019 2D echocardiogram, normal.  Normal ejection fraction.  No source of embolism. Antiplatelet therapy, on aspirin at home.  Continue aspirin. LDL 53.  At goal on a statin. Hemoglobin A1c, 7.1.  On home medications.  Fairly controlled.   Go back on glipizide and Metformin. Seen by neurology.  Symptoms mostly subsided.  Decided to continue home regimen including aspirin 325 mg daily.  She has a scheduled follow-up with her neurologist at outpatient. Hypertension, allowed permissive hypertension.  Start home medications tonight.  Seen by PT OT and speech.  Recommended ongoing therapies at home.  Going home with family.    Discharge Diagnoses:  Principal Problem:   Acute CVA (cerebrovascular accident) [facility]) Active Problems:   Hypertension   Asthma   Diabetes mellitus without complication ([place])   Acute ischemic stroke ([place])   Stroke (cerebrum) [facility])    Discharge Instructions  Discharge Instructions    Diet - low sodium heart healthy   Complete by: As directed    Diet Carb Modified   Complete by: As directed    Discharge instructions   Complete by: As directed    No changes in her medications.  Work with therapies at home. Please call and schedule a follow-up with your neurologist.   Increase activity slowly   Complete by: As directed      Allergies as of 09/17/2019      Reactions   Doxycycline    Chest Pain   Prednisone Other (See Comments)   High blood sugar      Medication List    TAKE these medications   amLODipine 10 MG tablet Commonly known as: NORVASC Take 10 mg by mouth daily.   aspirin 325 MG EC tablet Take 1 tablet (325 mg total) by mouth daily.   atorvastatin 80 MG tablet Commonly known as: LIPITOR Take 1 tablet (80 mg total) by mouth daily at 6 PM.   Fish Oil 1000 MG Caps Take 1,000 mg by mouth daily.  headache with right-sided neck pain. Dizziness for 2 days. EXAM: MRI HEAD WITHOUT CONTRAST TECHNIQUE: Multiplanar, multiecho pulse sequences of the brain and surrounding structures were obtained without intravenous  contrast. COMPARISON:  02/04/2019 FINDINGS: Brain: 2 subcentimeter acute white matter infarcts lateral to the atrium of the right lateral ventricle. Extensive chronic small vessel ischemia with confluent gliosis in the periventricular white matter. Chronic lacunes at the right thalamus and in the bilateral deep white matter tracks. Brain volume is normal. Numerous chronic microhemorrhages affecting basal ganglia, brainstem, and the lobar brain. No acute hemorrhage Vascular: Preserved flow voids Skull and upper cervical spine: Normal marrow signal. Sinuses/Orbits: Negative. IMPRESSION: 1. Two subcentimeter acute white matter infarcts next to the atrium of the right lateral ventricle. 2. Extensive chronic small vessel disease. 3. Chronic microhemorrhages in locations seen with both hypertension and amyloid angiopathy. Electronically Signed   By: Monte Fantasia M.D.   On: 09/16/2019 05:41   ECHOCARDIOGRAM COMPLETE  Result Date: 09/16/2019    ECHOCARDIOGRAM REPORT   Patient Name:   Krystal Vargas Date of Exam: 09/16/2019 Medical Rec #:  [id number]  Height:       62.0 in Accession #:    [id number] Weight:       189.0 lb Date of Birth:  02-02-1956  BSA:          1.866 m Patient Age:    63 years   BP:           147/80 mmHg Patient Gender: F          HR:           54 bpm. Exam Location:  Inpatient Procedure: 2D Echo Indications:    Stroke 434.91 / I163.9  History:        Patient has prior history of Echocardiogram examinations, most                 recent 02/05/2019. Stroke; Risk Factors:Hypertension,                 Dyslipidemia and Diabetes. Asthma.  Sonographer:    Darlina Sicilian RDCS Referring Phys: Saunders  1. Left ventricular ejection fraction, by estimation, is 60 to 65%. The left ventricle has normal function. The left ventricle has no regional wall motion abnormalities. There is mild left ventricular hypertrophy. Left ventricular diastolic parameters are consistent with Grade II diastolic  dysfunction (pseudonormalization).  2. Right ventricular systolic function is normal. The right ventricular size is mildly enlarged. There is normal pulmonary artery systolic pressure. The estimated right ventricular systolic pressure is 123XX123 mmHg.  3. The mitral valve is normal in structure. Trivial mitral valve regurgitation. No evidence of mitral stenosis.  4. The aortic valve is tricuspid. Aortic valve regurgitation is not visualized. No aortic stenosis is present.  5. The inferior vena cava is normal in size with greater than 50% respiratory variability, suggesting right atrial pressure of 3 mmHg. FINDINGS  Left Ventricle: Left ventricular ejection fraction, by estimation, is 60 to 65%. The left ventricle has normal function. The left ventricle has no regional wall motion abnormalities. The left ventricular internal cavity size was normal in size. There is  mild left ventricular hypertrophy. Left ventricular diastolic parameters are consistent with Grade II diastolic dysfunction (pseudonormalization). Right Ventricle: The right ventricular size is mildly enlarged. No increase in right ventricular wall thickness. Right ventricular systolic function is normal. There is normal pulmonary artery systolic pressure. The tricuspid regurgitant velocity is 1.87  m/s, and with  Physician Discharge Summary  Krystal Vargas [id number] DOB: 07/03/1955 DOA: 09/15/2019  PCP: Rich Fuchs, PA  Admit date: 09/15/2019 Discharge date: 09/17/2019  Admitted From: Home Disposition: Home with home health  Recommendations for Outpatient Follow-up:  1. Follow up with PCP in 1-2 weeks 2. Follow-up with your neurologist at [place]: Home health PT/OT/speech Equipment/Devices: Rollator  Discharge Condition: Stable CODE STATUS: Full code Diet recommendation: Low-salt low-carb diet  Discharge summary: 64 year old female with history of hypertension, hyperlipidemia and diabetes.  She had a subcortical infarct in September 2020 with no significant residual deficits.  She was treated with aspirin.  She also had suspected microhemorrhages so was not on dual antiplatelet therapy.  Presented to the emergency room with complaints of left facial and leg numbness and is stuttering.  She had the symptoms ongoing for at least last week.  She had seen her neurologist the day before.  MRI of the brain revealed 2 subcentimeter infarcts right lateral ventricle posterior horn.  On arrival to the hospital from emergency room, she was complaining of stuttering and no other acute findings.  Acute ischemic stroke: Right small infarct periventricular, secondary to small vessel disease. Clinical findings, stuttering, left hemiparesis. CT head findings, hypodense area in the right pons.  Small vessel disease.  Old cerebral white matter and thalamic infarcts. MRI of the brain, 2 subcentimeter right lateral ventricle white matter infarcts.  Extensive small vessel disease.  Chronic microhemorrhages. CTA of the head and neck, mild stenosis.  Unchanged from 01/2019 2D echocardiogram, normal.  Normal ejection fraction.  No source of embolism. Antiplatelet therapy, on aspirin at home.  Continue aspirin. LDL 53.  At goal on a statin. Hemoglobin A1c, 7.1.  On home medications.  Fairly controlled.   Go back on glipizide and Metformin. Seen by neurology.  Symptoms mostly subsided.  Decided to continue home regimen including aspirin 325 mg daily.  She has a scheduled follow-up with her neurologist at outpatient. Hypertension, allowed permissive hypertension.  Start home medications tonight.  Seen by PT OT and speech.  Recommended ongoing therapies at home.  Going home with family.    Discharge Diagnoses:  Principal Problem:   Acute CVA (cerebrovascular accident) [facility]) Active Problems:   Hypertension   Asthma   Diabetes mellitus without complication ([place])   Acute ischemic stroke ([place])   Stroke (cerebrum) [facility])    Discharge Instructions  Discharge Instructions    Diet - low sodium heart healthy   Complete by: As directed    Diet Carb Modified   Complete by: As directed    Discharge instructions   Complete by: As directed    No changes in her medications.  Work with therapies at home. Please call and schedule a follow-up with your neurologist.   Increase activity slowly   Complete by: As directed      Allergies as of 09/17/2019      Reactions   Doxycycline    Chest Pain   Prednisone Other (See Comments)   High blood sugar      Medication List    TAKE these medications   amLODipine 10 MG tablet Commonly known as: NORVASC Take 10 mg by mouth daily.   aspirin 325 MG EC tablet Take 1 tablet (325 mg total) by mouth daily.   atorvastatin 80 MG tablet Commonly known as: LIPITOR Take 1 tablet (80 mg total) by mouth daily at 6 PM.   Fish Oil 1000 MG Caps Take 1,000 mg by mouth daily.  Physician Discharge Summary  Krystal Vargas [id number] DOB: 07/03/1955 DOA: 09/15/2019  PCP: Rich Fuchs, PA  Admit date: 09/15/2019 Discharge date: 09/17/2019  Admitted From: Home Disposition: Home with home health  Recommendations for Outpatient Follow-up:  1. Follow up with PCP in 1-2 weeks 2. Follow-up with your neurologist at [place]: Home health PT/OT/speech Equipment/Devices: Rollator  Discharge Condition: Stable CODE STATUS: Full code Diet recommendation: Low-salt low-carb diet  Discharge summary: 64 year old female with history of hypertension, hyperlipidemia and diabetes.  She had a subcortical infarct in September 2020 with no significant residual deficits.  She was treated with aspirin.  She also had suspected microhemorrhages so was not on dual antiplatelet therapy.  Presented to the emergency room with complaints of left facial and leg numbness and is stuttering.  She had the symptoms ongoing for at least last week.  She had seen her neurologist the day before.  MRI of the brain revealed 2 subcentimeter infarcts right lateral ventricle posterior horn.  On arrival to the hospital from emergency room, she was complaining of stuttering and no other acute findings.  Acute ischemic stroke: Right small infarct periventricular, secondary to small vessel disease. Clinical findings, stuttering, left hemiparesis. CT head findings, hypodense area in the right pons.  Small vessel disease.  Old cerebral white matter and thalamic infarcts. MRI of the brain, 2 subcentimeter right lateral ventricle white matter infarcts.  Extensive small vessel disease.  Chronic microhemorrhages. CTA of the head and neck, mild stenosis.  Unchanged from 01/2019 2D echocardiogram, normal.  Normal ejection fraction.  No source of embolism. Antiplatelet therapy, on aspirin at home.  Continue aspirin. LDL 53.  At goal on a statin. Hemoglobin A1c, 7.1.  On home medications.  Fairly controlled.   Go back on glipizide and Metformin. Seen by neurology.  Symptoms mostly subsided.  Decided to continue home regimen including aspirin 325 mg daily.  She has a scheduled follow-up with her neurologist at outpatient. Hypertension, allowed permissive hypertension.  Start home medications tonight.  Seen by PT OT and speech.  Recommended ongoing therapies at home.  Going home with family.    Discharge Diagnoses:  Principal Problem:   Acute CVA (cerebrovascular accident) [facility]) Active Problems:   Hypertension   Asthma   Diabetes mellitus without complication ([place])   Acute ischemic stroke ([place])   Stroke (cerebrum) [facility])    Discharge Instructions  Discharge Instructions    Diet - low sodium heart healthy   Complete by: As directed    Diet Carb Modified   Complete by: As directed    Discharge instructions   Complete by: As directed    No changes in her medications.  Work with therapies at home. Please call and schedule a follow-up with your neurologist.   Increase activity slowly   Complete by: As directed      Allergies as of 09/17/2019      Reactions   Doxycycline    Chest Pain   Prednisone Other (See Comments)   High blood sugar      Medication List    TAKE these medications   amLODipine 10 MG tablet Commonly known as: NORVASC Take 10 mg by mouth daily.   aspirin 325 MG EC tablet Take 1 tablet (325 mg total) by mouth daily.   atorvastatin 80 MG tablet Commonly known as: LIPITOR Take 1 tablet (80 mg total) by mouth daily at 6 PM.   Fish Oil 1000 MG Caps Take 1,000 mg by mouth daily.  Physician Discharge Summary  Krystal Vargas [id number] DOB: 07/03/1955 DOA: 09/15/2019  PCP: Rich Fuchs, PA  Admit date: 09/15/2019 Discharge date: 09/17/2019  Admitted From: Home Disposition: Home with home health  Recommendations for Outpatient Follow-up:  1. Follow up with PCP in 1-2 weeks 2. Follow-up with your neurologist at [place]: Home health PT/OT/speech Equipment/Devices: Rollator  Discharge Condition: Stable CODE STATUS: Full code Diet recommendation: Low-salt low-carb diet  Discharge summary: 64 year old female with history of hypertension, hyperlipidemia and diabetes.  She had a subcortical infarct in September 2020 with no significant residual deficits.  She was treated with aspirin.  She also had suspected microhemorrhages so was not on dual antiplatelet therapy.  Presented to the emergency room with complaints of left facial and leg numbness and is stuttering.  She had the symptoms ongoing for at least last week.  She had seen her neurologist the day before.  MRI of the brain revealed 2 subcentimeter infarcts right lateral ventricle posterior horn.  On arrival to the hospital from emergency room, she was complaining of stuttering and no other acute findings.  Acute ischemic stroke: Right small infarct periventricular, secondary to small vessel disease. Clinical findings, stuttering, left hemiparesis. CT head findings, hypodense area in the right pons.  Small vessel disease.  Old cerebral white matter and thalamic infarcts. MRI of the brain, 2 subcentimeter right lateral ventricle white matter infarcts.  Extensive small vessel disease.  Chronic microhemorrhages. CTA of the head and neck, mild stenosis.  Unchanged from 01/2019 2D echocardiogram, normal.  Normal ejection fraction.  No source of embolism. Antiplatelet therapy, on aspirin at home.  Continue aspirin. LDL 53.  At goal on a statin. Hemoglobin A1c, 7.1.  On home medications.  Fairly controlled.   Go back on glipizide and Metformin. Seen by neurology.  Symptoms mostly subsided.  Decided to continue home regimen including aspirin 325 mg daily.  She has a scheduled follow-up with her neurologist at outpatient. Hypertension, allowed permissive hypertension.  Start home medications tonight.  Seen by PT OT and speech.  Recommended ongoing therapies at home.  Going home with family.    Discharge Diagnoses:  Principal Problem:   Acute CVA (cerebrovascular accident) [facility]) Active Problems:   Hypertension   Asthma   Diabetes mellitus without complication ([place])   Acute ischemic stroke ([place])   Stroke (cerebrum) [facility])    Discharge Instructions  Discharge Instructions    Diet - low sodium heart healthy   Complete by: As directed    Diet Carb Modified   Complete by: As directed    Discharge instructions   Complete by: As directed    No changes in her medications.  Work with therapies at home. Please call and schedule a follow-up with your neurologist.   Increase activity slowly   Complete by: As directed      Allergies as of 09/17/2019      Reactions   Doxycycline    Chest Pain   Prednisone Other (See Comments)   High blood sugar      Medication List    TAKE these medications   amLODipine 10 MG tablet Commonly known as: NORVASC Take 10 mg by mouth daily.   aspirin 325 MG EC tablet Take 1 tablet (325 mg total) by mouth daily.   atorvastatin 80 MG tablet Commonly known as: LIPITOR Take 1 tablet (80 mg total) by mouth daily at 6 PM.   Fish Oil 1000 MG Caps Take 1,000 mg by mouth daily.  Physician Discharge Summary  Krystal Vargas [id number] DOB: 07/03/1955 DOA: 09/15/2019  PCP: Rich Fuchs, PA  Admit date: 09/15/2019 Discharge date: 09/17/2019  Admitted From: Home Disposition: Home with home health  Recommendations for Outpatient Follow-up:  1. Follow up with PCP in 1-2 weeks 2. Follow-up with your neurologist at [place]: Home health PT/OT/speech Equipment/Devices: Rollator  Discharge Condition: Stable CODE STATUS: Full code Diet recommendation: Low-salt low-carb diet  Discharge summary: 64 year old female with history of hypertension, hyperlipidemia and diabetes.  She had a subcortical infarct in September 2020 with no significant residual deficits.  She was treated with aspirin.  She also had suspected microhemorrhages so was not on dual antiplatelet therapy.  Presented to the emergency room with complaints of left facial and leg numbness and is stuttering.  She had the symptoms ongoing for at least last week.  She had seen her neurologist the day before.  MRI of the brain revealed 2 subcentimeter infarcts right lateral ventricle posterior horn.  On arrival to the hospital from emergency room, she was complaining of stuttering and no other acute findings.  Acute ischemic stroke: Right small infarct periventricular, secondary to small vessel disease. Clinical findings, stuttering, left hemiparesis. CT head findings, hypodense area in the right pons.  Small vessel disease.  Old cerebral white matter and thalamic infarcts. MRI of the brain, 2 subcentimeter right lateral ventricle white matter infarcts.  Extensive small vessel disease.  Chronic microhemorrhages. CTA of the head and neck, mild stenosis.  Unchanged from 01/2019 2D echocardiogram, normal.  Normal ejection fraction.  No source of embolism. Antiplatelet therapy, on aspirin at home.  Continue aspirin. LDL 53.  At goal on a statin. Hemoglobin A1c, 7.1.  On home medications.  Fairly controlled.   Go back on glipizide and Metformin. Seen by neurology.  Symptoms mostly subsided.  Decided to continue home regimen including aspirin 325 mg daily.  She has a scheduled follow-up with her neurologist at outpatient. Hypertension, allowed permissive hypertension.  Start home medications tonight.  Seen by PT OT and speech.  Recommended ongoing therapies at home.  Going home with family.    Discharge Diagnoses:  Principal Problem:   Acute CVA (cerebrovascular accident) [facility]) Active Problems:   Hypertension   Asthma   Diabetes mellitus without complication ([place])   Acute ischemic stroke ([place])   Stroke (cerebrum) [facility])    Discharge Instructions  Discharge Instructions    Diet - low sodium heart healthy   Complete by: As directed    Diet Carb Modified   Complete by: As directed    Discharge instructions   Complete by: As directed    No changes in her medications.  Work with therapies at home. Please call and schedule a follow-up with your neurologist.   Increase activity slowly   Complete by: As directed      Allergies as of 09/17/2019      Reactions   Doxycycline    Chest Pain   Prednisone Other (See Comments)   High blood sugar      Medication List    TAKE these medications   amLODipine 10 MG tablet Commonly known as: NORVASC Take 10 mg by mouth daily.   aspirin 325 MG EC tablet Take 1 tablet (325 mg total) by mouth daily.   atorvastatin 80 MG tablet Commonly known as: LIPITOR Take 1 tablet (80 mg total) by mouth daily at 6 PM.   Fish Oil 1000 MG Caps Take 1,000 mg by mouth daily.  headache with right-sided neck pain. Dizziness for 2 days. EXAM: MRI HEAD WITHOUT CONTRAST TECHNIQUE: Multiplanar, multiecho pulse sequences of the brain and surrounding structures were obtained without intravenous  contrast. COMPARISON:  02/04/2019 FINDINGS: Brain: 2 subcentimeter acute white matter infarcts lateral to the atrium of the right lateral ventricle. Extensive chronic small vessel ischemia with confluent gliosis in the periventricular white matter. Chronic lacunes at the right thalamus and in the bilateral deep white matter tracks. Brain volume is normal. Numerous chronic microhemorrhages affecting basal ganglia, brainstem, and the lobar brain. No acute hemorrhage Vascular: Preserved flow voids Skull and upper cervical spine: Normal marrow signal. Sinuses/Orbits: Negative. IMPRESSION: 1. Two subcentimeter acute white matter infarcts next to the atrium of the right lateral ventricle. 2. Extensive chronic small vessel disease. 3. Chronic microhemorrhages in locations seen with both hypertension and amyloid angiopathy. Electronically Signed   By: Monte Fantasia M.D.   On: 09/16/2019 05:41   ECHOCARDIOGRAM COMPLETE  Result Date: 09/16/2019    ECHOCARDIOGRAM REPORT   Patient Name:   Krystal Vargas Date of Exam: 09/16/2019 Medical Rec #:  [id number]  Height:       62.0 in Accession #:    [id number] Weight:       189.0 lb Date of Birth:  02-02-1956  BSA:          1.866 m Patient Age:    63 years   BP:           147/80 mmHg Patient Gender: F          HR:           54 bpm. Exam Location:  Inpatient Procedure: 2D Echo Indications:    Stroke 434.91 / I163.9  History:        Patient has prior history of Echocardiogram examinations, most                 recent 02/05/2019. Stroke; Risk Factors:Hypertension,                 Dyslipidemia and Diabetes. Asthma.  Sonographer:    Darlina Sicilian RDCS Referring Phys: Saunders  1. Left ventricular ejection fraction, by estimation, is 60 to 65%. The left ventricle has normal function. The left ventricle has no regional wall motion abnormalities. There is mild left ventricular hypertrophy. Left ventricular diastolic parameters are consistent with Grade II diastolic  dysfunction (pseudonormalization).  2. Right ventricular systolic function is normal. The right ventricular size is mildly enlarged. There is normal pulmonary artery systolic pressure. The estimated right ventricular systolic pressure is 123XX123 mmHg.  3. The mitral valve is normal in structure. Trivial mitral valve regurgitation. No evidence of mitral stenosis.  4. The aortic valve is tricuspid. Aortic valve regurgitation is not visualized. No aortic stenosis is present.  5. The inferior vena cava is normal in size with greater than 50% respiratory variability, suggesting right atrial pressure of 3 mmHg. FINDINGS  Left Ventricle: Left ventricular ejection fraction, by estimation, is 60 to 65%. The left ventricle has normal function. The left ventricle has no regional wall motion abnormalities. The left ventricular internal cavity size was normal in size. There is  mild left ventricular hypertrophy. Left ventricular diastolic parameters are consistent with Grade II diastolic dysfunction (pseudonormalization). Right Ventricle: The right ventricular size is mildly enlarged. No increase in right ventricular wall thickness. Right ventricular systolic function is normal. There is normal pulmonary artery systolic pressure. The tricuspid regurgitant velocity is 1.87  m/s, and with  headache with right-sided neck pain. Dizziness for 2 days. EXAM: MRI HEAD WITHOUT CONTRAST TECHNIQUE: Multiplanar, multiecho pulse sequences of the brain and surrounding structures were obtained without intravenous  contrast. COMPARISON:  02/04/2019 FINDINGS: Brain: 2 subcentimeter acute white matter infarcts lateral to the atrium of the right lateral ventricle. Extensive chronic small vessel ischemia with confluent gliosis in the periventricular white matter. Chronic lacunes at the right thalamus and in the bilateral deep white matter tracks. Brain volume is normal. Numerous chronic microhemorrhages affecting basal ganglia, brainstem, and the lobar brain. No acute hemorrhage Vascular: Preserved flow voids Skull and upper cervical spine: Normal marrow signal. Sinuses/Orbits: Negative. IMPRESSION: 1. Two subcentimeter acute white matter infarcts next to the atrium of the right lateral ventricle. 2. Extensive chronic small vessel disease. 3. Chronic microhemorrhages in locations seen with both hypertension and amyloid angiopathy. Electronically Signed   By: Monte Fantasia M.D.   On: 09/16/2019 05:41   ECHOCARDIOGRAM COMPLETE  Result Date: 09/16/2019    ECHOCARDIOGRAM REPORT   Patient Name:   Krystal Vargas Date of Exam: 09/16/2019 Medical Rec #:  [id number]  Height:       62.0 in Accession #:    [id number] Weight:       189.0 lb Date of Birth:  02-02-1956  BSA:          1.866 m Patient Age:    63 years   BP:           147/80 mmHg Patient Gender: F          HR:           54 bpm. Exam Location:  Inpatient Procedure: 2D Echo Indications:    Stroke 434.91 / I163.9  History:        Patient has prior history of Echocardiogram examinations, most                 recent 02/05/2019. Stroke; Risk Factors:Hypertension,                 Dyslipidemia and Diabetes. Asthma.  Sonographer:    Darlina Sicilian RDCS Referring Phys: [place]  1. Left ventricular ejection fraction, by estimation, is 60 to 65%. The left ventricle has normal function. The left ventricle has no regional wall motion abnormalities. There is mild left ventricular hypertrophy. Left ventricular diastolic parameters are consistent with Grade II diastolic  dysfunction (pseudonormalization).  2. Right ventricular systolic function is normal. The right ventricular size is mildly enlarged. There is normal pulmonary artery systolic pressure. The estimated right ventricular systolic pressure is 123XX123 mmHg.  3. The mitral valve is normal in structure. Trivial mitral valve regurgitation. No evidence of mitral stenosis.  4. The aortic valve is tricuspid. Aortic valve regurgitation is not visualized. No aortic stenosis is present.  5. The inferior vena cava is normal in size with greater than 50% respiratory variability, suggesting right atrial pressure of 3 mmHg. FINDINGS  Left Ventricle: Left ventricular ejection fraction, by estimation, is 60 to 65%. The left ventricle has normal function. The left ventricle has no regional wall motion abnormalities. The left ventricular internal cavity size was normal in size. There is  mild left ventricular hypertrophy. Left ventricular diastolic parameters are consistent with Grade II diastolic dysfunction (pseudonormalization). Right Ventricle: The right ventricular size is mildly enlarged. No increase in right ventricular wall thickness. Right ventricular systolic function is normal. There is normal pulmonary artery systolic pressure. The tricuspid regurgitant velocity is 1.87  m/s, and with  Physician Discharge Summary  Krystal Vargas [id number] DOB: 07/03/1955 DOA: 09/15/2019  PCP: Rich Fuchs, PA  Admit date: 09/15/2019 Discharge date: 09/17/2019  Admitted From: Home Disposition: Home with home health  Recommendations for Outpatient Follow-up:  1. Follow up with PCP in 1-2 weeks 2. Follow-up with your neurologist at [place]: Home health PT/OT/speech Equipment/Devices: Rollator  Discharge Condition: Stable CODE STATUS: Full code Diet recommendation: Low-salt low-carb diet  Discharge summary: 64 year old female with history of hypertension, hyperlipidemia and diabetes.  She had a subcortical infarct in September 2020 with no significant residual deficits.  She was treated with aspirin.  She also had suspected microhemorrhages so was not on dual antiplatelet therapy.  Presented to the emergency room with complaints of left facial and leg numbness and is stuttering.  She had the symptoms ongoing for at least last week.  She had seen her neurologist the day before.  MRI of the brain revealed 2 subcentimeter infarcts right lateral ventricle posterior horn.  On arrival to the hospital from emergency room, she was complaining of stuttering and no other acute findings.  Acute ischemic stroke: Right small infarct periventricular, secondary to small vessel disease. Clinical findings, stuttering, left hemiparesis. CT head findings, hypodense area in the right pons.  Small vessel disease.  Old cerebral white matter and thalamic infarcts. MRI of the brain, 2 subcentimeter right lateral ventricle white matter infarcts.  Extensive small vessel disease.  Chronic microhemorrhages. CTA of the head and neck, mild stenosis.  Unchanged from 01/2019 2D echocardiogram, normal.  Normal ejection fraction.  No source of embolism. Antiplatelet therapy, on aspirin at home.  Continue aspirin. LDL 53.  At goal on a statin. Hemoglobin A1c, 7.1.  On home medications.  Fairly controlled.   Go back on glipizide and Metformin. Seen by neurology.  Symptoms mostly subsided.  Decided to continue home regimen including aspirin 325 mg daily.  She has a scheduled follow-up with her neurologist at outpatient. Hypertension, allowed permissive hypertension.  Start home medications tonight.  Seen by PT OT and speech.  Recommended ongoing therapies at home.  Going home with family.    Discharge Diagnoses:  Principal Problem:   Acute CVA (cerebrovascular accident) [facility]) Active Problems:   Hypertension   Asthma   Diabetes mellitus without complication ([place])   Acute ischemic stroke ([place])   Stroke (cerebrum) [facility])    Discharge Instructions  Discharge Instructions    Diet - low sodium heart healthy   Complete by: As directed    Diet Carb Modified   Complete by: As directed    Discharge instructions   Complete by: As directed    No changes in her medications.  Work with therapies at home. Please call and schedule a follow-up with your neurologist.   Increase activity slowly   Complete by: As directed      Allergies as of 09/17/2019      Reactions   Doxycycline    Chest Pain   Prednisone Other (See Comments)   High blood sugar      Medication List    TAKE these medications   amLODipine 10 MG tablet Commonly known as: NORVASC Take 10 mg by mouth daily.   aspirin 325 MG EC tablet Take 1 tablet (325 mg total) by mouth daily.   atorvastatin 80 MG tablet Commonly known as: LIPITOR Take 1 tablet (80 mg total) by mouth daily at 6 PM.   Fish Oil 1000 MG Caps Take 1,000 mg by mouth daily.

## 2019-09-17 NOTE — TOC Transition Note (Addendum)
Transition of Care [facility]) - CM/SW Discharge Note   Patient Details  Name: Krystal Vargas MRN: [id number] Date of Birth: Jun 07, 1955  [facility] CM/SW Contact:  Bartholomew Crews, RN Phone Number: [phone] 09/17/2019, 12:48 PM   Clinical Narrative:     Patient to transition home today. Noted HH and DME arrangements in place by previous NCM. Verified with [employer] that rollator has been delivered to the room. Notified liaison at [facility] of discharge orders - [place] to follow up with patient with start of care on Monday or Tuesday next week. No further TOC needs identified.   Final next level of care: Home w Home Health Services Barriers to Discharge: No Barriers Identified   Patient Goals and CMS Choice   CMS Medicare.gov Compare Post Acute Care list provided to:: Patient Choice offered to / list presented to : Patient  Discharge Placement                       Discharge Plan and Services   Discharge Planning Services: CM Consult Post Acute Care Choice: Home Health, Durable Medical Equipment          DME Arranged: Walker rolling with seat DME Agency: [employer] Date DME Agency Contacted: 09/17/19 Time DME Agency Contacted: 0930 Representative spoke with at DME Agency: [facility] HH Arranged: PT, OT, Social Work [employer] Agency: [place] Date [place]: 09/17/19 Time [place]: 1200 Representative spoke with at [place]: [place] ([place]) Interventions     Readmission Risk Interventions No flowsheet data found.

## 2019-09-17 NOTE — Progress Notes (Signed)
Physical Therapy Treatment Patient Details Name: Krystal Vargas MRN: [id number] DOB: 1955-10-06 Today's Date: 09/17/2019    History of Present Illness 64 y.o. female past medical history of hypertension, hyperlipidemia, diabetes, prior small vessel etiology right frontal subcortical infarct in September 2020 with no significant residual deficits, multiple chronic microhemorrhages seen on MRI brain at the time of her last stroke, chronic headaches, history of cervical cancer status post hysterectomy in 2018, fibromyalgia, presented to [facility] with complaints of left facial and leg numbness. MRI revealed 2 subcentimeter infarcts adjacent to the right lateral ventricle posterior horn.    PT Comments    Patient is making progress toward PT goals and tolerated mobility well. Pt will continue to benefit from further PT services to maximize independence and safety with mobility.    Follow Up Recommendations  Home health PT;Supervision - Intermittent     Equipment Recommendations  Other (comment)(rollator)    Recommendations for Other Services       Precautions / Restrictions Precautions Precautions: Fall Restrictions Weight Bearing Restrictions: No    Mobility  Bed Mobility               General bed mobility comments: pt sitting EOB upon arrival  Transfers Overall transfer level: Needs assistance Equipment used: 4-wheeled walker Transfers: Sit to/from Stand Sit to Stand: Supervision         General transfer comment: cues for use of brakes   Ambulation/Gait Ambulation/Gait assistance: Supervision Gait Distance (Feet): 200 Feet Assistive device: 4-wheeled walker Gait Pattern/deviations: Step-through pattern;Decreased stride length;Decreased stance time - left;Decreased step length - right Gait velocity: decreased   General Gait Details: slow, steady gait; decreased R step length and height; no LOB    Stairs Stairs: Yes Stairs assistance: Min  guard Stair Management: No rails;Step to pattern;Backwards;With walker Number of Stairs: 1 General stair comments: simulated stepping onto step stool to get into high bed; cues for use of brakes on rollator and for sequencing   Wheelchair Mobility    Modified Rankin (Stroke Patients Only) Modified Rankin (Stroke Patients Only) Pre-Morbid Rankin Score: No significant disability Modified Rankin: Moderate disability     Balance Overall balance assessment: Needs assistance Sitting-balance support: No upper extremity supported;Feet supported Sitting balance-Leahy Scale: Good     Standing balance support: No upper extremity supported;During functional activity Standing balance-Leahy Scale: Fair                              Cognition Arousal/Alertness: Awake/alert Behavior During Therapy: WFL for tasks assessed/performed Overall Cognitive Status: Within Functional Limits for tasks assessed                                        Exercises      General Comments        Pertinent Vitals/Pain Pain Assessment: No/denies pain    Home Living                      Prior Function            PT Goals (current goals can now be found in the care plan section) Acute Rehab PT Goals Patient Stated Goal: home. alleviate caregiver fatigue. Progress towards PT goals: Progressing toward goals    Frequency    Min 4X/week      PT Plan Current plan  remains appropriate    Co-evaluation              AM-PAC PT "6 Clicks" Mobility   Outcome Measure  Help needed turning from your back to your side while in a flat bed without using bedrails?: None Help needed moving from lying on your back to sitting on the side of a flat bed without using bedrails?: None Help needed moving to and from a bed to a chair (including a wheelchair)?: A Little Help needed standing up from a chair using your arms (e.g., wheelchair or bedside chair)?: A  Little Help needed to walk in hospital room?: A Little Help needed climbing 3-5 steps with a railing? : A Little 6 Click Score: 20    End of Session Equipment Utilized During Treatment: Gait belt Activity Tolerance: Patient tolerated treatment well Patient left: with call bell/phone within reach;in chair Nurse Communication: Mobility status PT Visit Diagnosis: Other abnormalities of gait and mobility (R26.89)     Time: 7253-6644 PT Time Calculation (min) (ACUTE ONLY): 30 min  Charges:  $Gait Training: 23-37 mins                     Erline Levine, PTA Acute Rehabilitation Services Pager: [phone] Office: [phone]     Carolynne Edouard 09/17/2019, 9:57 AM

## 2019-09-17 NOTE — Progress Notes (Signed)
Patient discharged out via wheelchair; accompanied home by her daughter.  All belongings and equip sent with patient.

## 2019-09-17 NOTE — Plan of Care (Signed)
Patient stable, discussed POC with patient, agreeable with plan. Discussed stroke education via stroke handbook via teach back, verbalizes understanding, denies question/concerns at this time.    Problem: Education: Goal: Knowledge of disease or condition will improve Outcome: Adequate for Discharge Goal: Knowledge of secondary prevention will improve Outcome: Adequate for Discharge Goal: Knowledge of patient specific risk factors addressed and post discharge goals established will improve Outcome: Adequate for Discharge Goal: Individualized Educational Video(s) Outcome: Adequate for Discharge   Problem: Coping: Goal: Will verbalize positive feelings about self Outcome: Adequate for Discharge Goal: Will identify appropriate support needs Outcome: Adequate for Discharge   Problem: Health Behavior/Discharge Planning: Goal: Ability to manage health-related needs will improve Outcome: Adequate for Discharge   Problem: Self-Care: Goal: Ability to participate in self-care as condition permits will improve Outcome: Adequate for Discharge Goal: Verbalization of feelings and concerns over difficulty with self-care will improve Outcome: Adequate for Discharge Goal: Ability to communicate needs accurately will improve Outcome: Adequate for Discharge   Problem: Nutrition: Goal: Risk of aspiration will decrease Outcome: Adequate for Discharge Goal: Dietary intake will improve Outcome: Adequate for Discharge   Problem: Intracerebral Hemorrhage Tissue Perfusion: Goal: Complications of Intracerebral Hemorrhage will be minimized Outcome: Adequate for Discharge   Problem: Ischemic Stroke/TIA Tissue Perfusion: Goal: Complications of ischemic stroke/TIA will be minimized Outcome: Adequate for Discharge   Problem: Spontaneous Subarachnoid Hemorrhage Tissue Perfusion: Goal: Complications of Spontaneous Subarachnoid Hemorrhage will be minimized Outcome: Adequate for Discharge

## 2019-09-22 ENCOUNTER — Other Ambulatory Visit: Payer: Self-pay | Admitting: *Deleted

## 2019-09-22 NOTE — Patient Outreach (Signed)
[facility]) Care Management  09/22/2019  Nikola Renck 09-30-1955 [id number]   RED ON EMMI ALERT - Stroke Day # 3 Date: 5/11 Red Alert Reason: Other questions/problems with meds   Outreach attempt #1, unsuccessful, message left with female answering phone to call member back when she is available.   Plan: RN CM will send unsuccessful outreach letter, follow up within the next 3-4 business days.  Valente David, [place], MSN [place] [phone]

## 2019-09-28 ENCOUNTER — Other Ambulatory Visit: Payer: Self-pay | Admitting: *Deleted

## 2019-09-28 NOTE — Patient Outreach (Signed)
[facility]) Care Management  09/28/2019  Ellin Confair [birth year] [id number]    RED ON EMMI ALERT - Stroke Day # 3 Date: 5/11 Red Alert Reason: Other questions/problems with meds  Outreach attempt #2, successful, identity verified.  This care manager introduced self and stated purpose of call.  [facility] care management services explained.    Member report living in her home with spouse, state she is independent in all ADL's, able to care for herself but admits it is becoming difficulty to care for herself and husband.  State "I can't keep doing everything I been doing."  She is in the process of applying for the PACE program for both herself and her husband.  This care manager considered PCS but member denies having Medicaid.  State she will apply for this benefit as well.  Denies having questions or problems with her medications, report understanding of administration but state she's not taking some of them as she should (antihypertensives).  State she was having some side effects and decided to stop taking them.  She has informed her physician of this and will follow up with them regarding adjustments.  Report she is monitoring her blood pressure daily, denies any abnormal readings.  She has had follow up appointment with PCP and neurology, state she will schedule visit with cardiology this week.  Denies the need for transportation.    Denies any urgent/recurrent issues, agrees to follow up with this care manager with any needs.  Plan: RN CM will send contact information for this care manager, will not open case at this time.  Valente David, [place], MSN [place] [phone]

## 2019-10-06 ENCOUNTER — Other Ambulatory Visit: Payer: Self-pay

## 2019-10-06 ENCOUNTER — Encounter (HOSPITAL_COMMUNITY): Payer: Self-pay

## 2019-10-06 ENCOUNTER — Emergency Department (HOSPITAL_COMMUNITY)
Admission: EM | Admit: 2019-10-06 | Discharge: 2019-10-07 | Disposition: A | Discharge status: Home / Self Care | Payer: Medicare Other | Attending: Emergency Medicine | Admitting: Emergency Medicine

## 2019-10-06 DIAGNOSIS — Z7984 Long term (current) use of oral hypoglycemic drugs: Secondary | ICD-10-CM | POA: Diagnosis not present

## 2019-10-06 DIAGNOSIS — R202 Paresthesia of skin: Secondary | ICD-10-CM | POA: Diagnosis not present

## 2019-10-06 DIAGNOSIS — I10 Essential (primary) hypertension: Secondary | ICD-10-CM | POA: Diagnosis present

## 2019-10-06 DIAGNOSIS — Z87891 Personal history of nicotine dependence: Secondary | ICD-10-CM | POA: Insufficient documentation

## 2019-10-06 DIAGNOSIS — E119 Type 2 diabetes mellitus without complications: Secondary | ICD-10-CM | POA: Insufficient documentation

## 2019-10-06 DIAGNOSIS — R519 Headache, unspecified: Secondary | ICD-10-CM | POA: Diagnosis not present

## 2019-10-06 LAB — BASIC METABOLIC PANEL
Anion gap: 10 (ref 5–15)
BUN: 11 mg/dL (ref 8–23)
CO2: 28 mmol/L (ref 22–32)
Calcium: 9.5 mg/dL (ref 8.9–10.3)
Chloride: 102 mmol/L (ref 98–111)
Creatinine, Ser: 0.66 mg/dL (ref 0.44–1.00)
GFR calc Af Amer: >60 mL/min (ref >60)
GFR calc non Af Amer: >60 mL/min (ref >60)
Glucose, Bld: 89 mg/dL (ref 70–99)
Potassium: 3.3 mmol/L — ABNORMAL LOW (ref 3.5–5.1)
Sodium: 140 mmol/L (ref 135–145)

## 2019-10-06 NOTE — ED Triage Notes (Signed)
Patient arrived by [facility] for HTN-patient stopped her BP med x 1 on 5/10 because didn't like the side effect. Alert and oriented, NAD

## 2019-10-07 NOTE — Discharge Instructions (Addendum)
You were seen today for high blood pressure.  Your lab tests and EKG are reassuring.  You have no signs of hypertensive urgency or emergency.  Continue your blood pressure medication.  Take your blood pressure twice daily and keep a log until you follow-up with your primary physician.  If you develop significant headache, chest pain, or any new or worsening symptoms you should be reevaluated.

## 2019-10-07 NOTE — ED Provider Notes (Signed)
[facility] DEPARTMENT Provider Note   CSN: [id number] Arrival date & time: 10/06/19  1301     History No chief complaint on file.   Krystal Vargas is a 64 y.o. female.  HPI     This is a 64 year old female with a history of diabetes, fibromyalgia, hypertension, stroke who presents with concerns for high blood pressure.  Patient recently had a stroke in early May.  She was started on several new blood pressure medications.  She reports that she has been taking her hydrochlorothiazide and her isosorbide mononitrate but had intolerable side effects to amlodipine and metoprolol so has not been taking those.  She has noted over the last 24 hours that her blood pressures have increased to the 180s over 100s range.  She states while she was at home she began to have a mild headache and tingling in her feet.  This concerned her.  No chest pain, vision changes, weakness, numbness, strokelike symptoms.    She has been in the waiting room for over 10 hours after my assessment.  She states she feels much better and is at her baseline.  Blood pressure on my evaluation 150s over 70s.  Past Medical History:  Diagnosis Date  . Asthma   . Cancer ([place])   . Diabetes mellitus   . Fibromyalgia   . High cholesterol   . Hypertension   . Stroke [facility])     Patient Active Problem List   Diagnosis Date Noted  . Acute CVA (cerebrovascular accident) ([place]) 09/16/2019  . Stroke (cerebrum) ([place]) 09/16/2019  . CVA (cerebral vascular accident) ([place]) 02/05/2019  . Acute ischemic stroke ([place]) 02/04/2019  . Obesity (BMI 30-39.9) 06/11/2018  . Chest pain 06/09/2018  . SOB (shortness of breath) 02/07/2017  . Chest pressure 02/07/2017  . Diabetes mellitus without complication ([place]) [id number]  . GERD (gastroesophageal reflux disease) 02/07/2017  . Endometrial cancer ([place]) 02/07/2017  . Hypokalemia 02/07/2017  . Left arm pain 02/07/2017  . Hypertension   . Hyperlipidemia associated  with type 2 diabetes mellitus ([place])   . Asthma     Past Surgical History:  Procedure Laterality Date  . TUBAL LIGATION       OB History   No obstetric history on file.     Family History  Problem Relation Age of Onset  . Hypertension Mother   . Diabetes Mellitus II Mother   . Bronchitis Father   . Hypertension Sister   . Diabetes Mellitus II Sister   . Hypertension Brother   . Diabetes Mellitus II Brother     Social History   Tobacco Use  . Smoking status: Former Research scientist (life sciences)  . Smokeless tobacco: Never Used  Substance Use Topics  . Alcohol use: Yes    Comment: occ  . Drug use: No    Home Medications Prior to Admission medications   Medication Sig Start Date End Date Taking? Authorizing Provider  aspirin EC 325 MG EC tablet Take 1 tablet (325 mg total) by mouth daily. 02/06/19  Yes Eulogio Bear U, DO  atorvastatin (LIPITOR) 80 MG tablet Take 1 tablet (80 mg total) by mouth daily at 6 PM. 02/05/19  Yes Vann, Jessica U, DO  GARLIC PO Take 1 tablet by mouth daily.   Yes [provider]  glipiZIDE (GLUCOTROL XL) 5 MG 24 hr tablet Take 5 mg by mouth daily with breakfast.   Yes [provider]  hydrochlorothiazide (HYDRODIURIL) 25 MG tablet Take 25 mg by mouth daily.  08/17/19  Yes [provider]  isosorbide mononitrate (IMDUR) 30 MG 24 hr tablet Take 30 mg by mouth daily. 05/11/19  Yes [provider]  metFORMIN (GLUMETZA) 500 MG (MOD) 24 hr tablet Take 2 tablets (1,000 mg total) by mouth 2 (two) times daily with a meal. 02/07/19  Yes Vann, Jessica U, DO  Multiple Vitamins-Minerals (MULTIPLE VITAMINS/WOMENS) tablet Take 1 tablet by mouth daily.   Yes [provider]  nitroGLYCERIN (NITROSTAT) 0.4 MG SL tablet Place 1 tablet (0.4 mg total) under the tongue every 5 (five) minutes as needed for chest pain. 06/10/18  Yes Smith, Delbert Phenix, MD  Omega-3 Fatty Acids (FISH OIL) 1000 MG CAPS Take 1,000 mg by mouth daily.   Yes [provider]  omeprazole (PRILOSEC) 40 MG capsule Take 40 mg by mouth daily.  08/10/19  Yes [provider]  Polyethyl Glycol-Propyl Glycol (SYSTANE OP) Place 1 drop into both eyes daily.    Yes [provider]  potassium chloride (KLOR-CON) 10 MEQ tablet Take 10 mEq by mouth daily. 07/20/19  Yes [provider]  sodium chloride (OCEAN) 0.65 % SOLN nasal spray Place 1 spray into both nostrils as needed for congestion.   Yes [provider]  thiamine (VITAMIN B-1) 100 MG tablet Take 100 mg by mouth daily.   Yes [provider]    Allergies    Doxycycline and Prednisone  Review of Systems   Review of Systems  Constitutional: Negative for fever.  Eyes: Negative for visual disturbance.  Respiratory: Negative for shortness of breath.   Cardiovascular: Negative for chest pain.  Gastrointestinal: Negative for abdominal pain.  Genitourinary: Negative for dysuria.  Neurological: Positive for numbness and headaches.  All other systems reviewed and are negative.   Physical Exam Updated Vital Signs BP (!) 170/86   Pulse (!) 58   Temp 98.1 F (36.7 C) (Oral)   Resp 19   LMP 05/16/2012   SpO2 98%   Physical Exam Vitals and nursing note reviewed.  Constitutional:      Appearance: She is well-developed. She is obese. She is not ill-appearing.  HENT:     Head: Normocephalic and atraumatic.     Mouth/Throat:     Mouth: Mucous membranes are moist.  Eyes:     Pupils: Pupils are equal, round, and reactive to light.  Cardiovascular:     Rate and Rhythm: Normal rate and regular rhythm.     Heart sounds: Normal heart sounds.  Pulmonary:     Effort: Pulmonary effort is normal. No respiratory distress.     Breath sounds: No wheezing.  Abdominal:     General: Bowel sounds are normal.     Palpations: Abdomen is soft.     Tenderness: There is no abdominal tenderness.  Musculoskeletal:     Cervical back: Neck supple.     Comments: Trace bilateral lower  extremity edema  Skin:    General: Skin is warm and dry.  Neurological:     Mental Status: She is alert and oriented to person, place, and time.     Comments: 5 out of 5 strength in all 4 extremities, cranial nerves II through XII intact  Psychiatric:        Mood and Affect: Mood normal.     ED Results / Procedures / Treatments   Labs (all labs ordered are listed, but only abnormal results are displayed) Labs Reviewed  BASIC METABOLIC PANEL - Abnormal; Notable for the following components:  Result Value   Potassium 3.3 (*)    All other components within normal limits    EKG EKG Interpretation  Date/Time:  Thursday Oct 07 2019 00:23:00 EDT Ventricular Rate:  57 PR Interval:    QRS Duration: 101 QT Interval:  451 QTC Calculation: 440 R Axis:   63 Text Interpretation: Sinus rhythm RSR' in V1 or V2, right VCD or RVH Borderline T abnormalities, anterior leads No significant change since last tracing Confirmed by Thayer Jew [phone]) on 10/07/2019 12:39:24 AM   Radiology No results found.  Procedures Procedures (including critical care time)  Medications Ordered in ED Medications - No data to display  ED Course  I have reviewed the triage vital signs and the nursing notes.  Pertinent labs & imaging results that were available during my care of the patient were reviewed by me and considered in my medical decision making (see chart for details).    MDM Rules/Calculators/A&P                       Patient presents with concerns for elevated blood pressures at home.  Recent stroke.  She was placed on several blood pressure medications at discharge.  However, she is only taking Imdur and HCTZ.  She states she had a mild headache at home without additional neurologic symptoms.  This has improved and she complains of no symptoms at this time and is at her baseline.  On my evaluation her blood pressure was 150s over 70s.  Her exam is benign.  Doubt hypertensive urgency or  emergency.  Suspect that her blood pressures trending upwards because she is not on adequate medications.  However, would defer to her primary physician for titration of medications given that she has had adverse effects to amlodipine and metoprolol at this point.  EKG reviewed and without ischemic changes.  BMP shows no endorgan damage or metabolite derangement.  Do not feel she needs further work-up at this time.  Specifically she is showing no signs of symptoms of stroke or intracranial hemorrhage and I do not feel she needs CT imaging.  Will discharge home.  Encouraged her to keep a log of her blood pressures and follow-up on Tuesday with her primary physician as scheduled.  Patient stated understanding is in agreement with plan.  After history, exam, and medical workup I feel the patient has been appropriately medically screened and is safe for discharge home. Pertinent diagnoses were discussed with the patient. Patient was given return precautions.   Final Clinical Impression(s) / ED Diagnoses Final diagnoses:  Essential hypertension    Rx / DC Orders ED Discharge Orders    None       Arneisha Kincannon, Barbette Hair, MD 10/07/19 [phone]

## 2019-10-07 NOTE — ED Notes (Signed)
Patient verbalizes understanding of discharge instructions. Opportunity for questioning and answers were provided. Armband removed by staff, pt discharged from ED.  

## 2019-10-07 NOTE — ED Notes (Signed)
This RN called [employer] for pt, was given ETA of 30-53min. Staff & pt made aware of same

## 2020-05-18 IMAGING — MR MR HEAD W/O CM
11 of 12 series · 43 of 48 positions shown · non-contrast
Comparison: 02/04/2019

CLINICAL DATA: Right temporoparietal headache with right-sided neck
pain. Dizziness for 2 days.

EXAM:
MRI HEAD WITHOUT CONTRAST
TECHNIQUE: Multiplanar, multiecho pulse sequences of the brain and surrounding
structures were obtained without intravenous contrast.

[Series 5: DWI · axial · 3.0mm · 0.88mm/px · z∈[-96,+39]mm · 9 of 92 slices shown (1 of 4)]
[im 1/92]
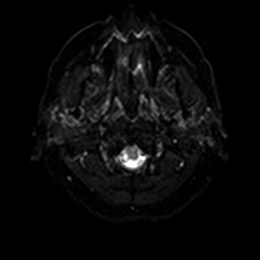
[im 12/92]
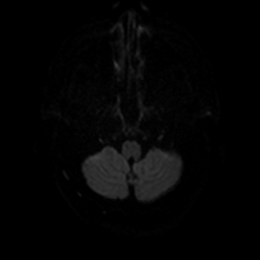
[im 23/92]
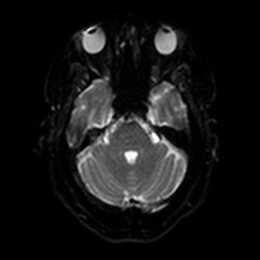
[im 35/92]
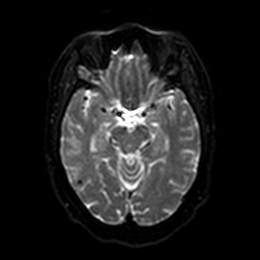
[im 46/92]
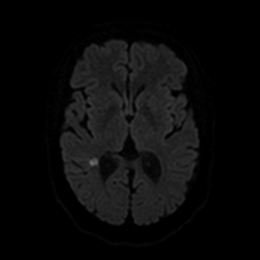
[im 57/92]
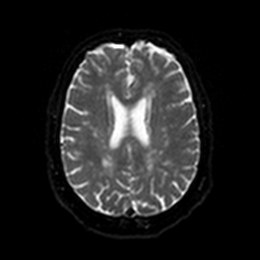
[im 69/92]
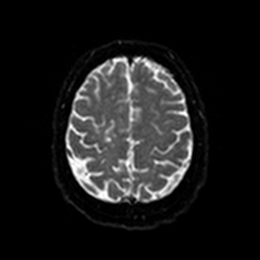
[im 80/92]
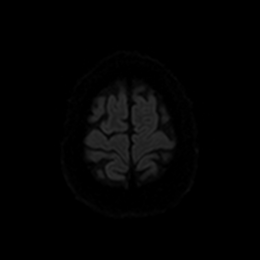
[im 92/92]
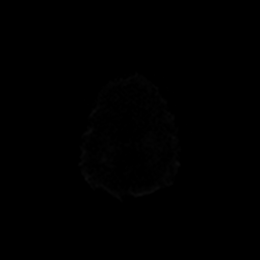

[Series 6: DWI · axial · 3.0mm · 0.88mm/px · z∈[-96,+39]mm · 4 of 45 slices shown (2 of 4)]
[im 1/45]
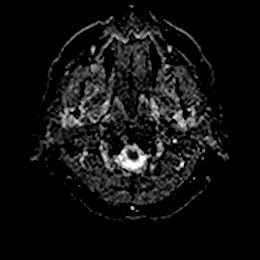
[im 15/45]
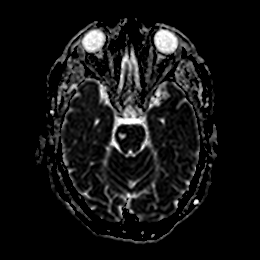
[im 30/45]
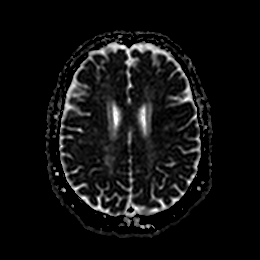
[im 45/45]
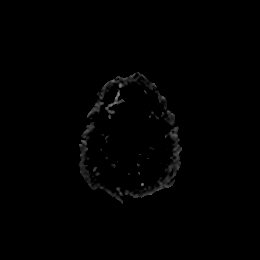

[Series 7: DWI · coronal · 4.0mm · 0.88mm/px · 6 of 66 slices shown (3 of 4)]
[im 1/66]
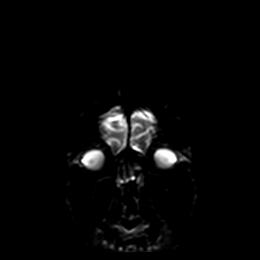
[im 14/66]
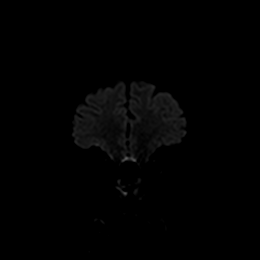
[im 27/66]
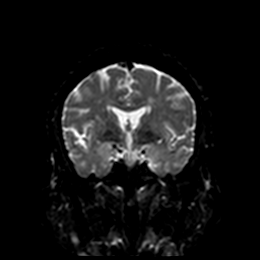
[im 40/66]
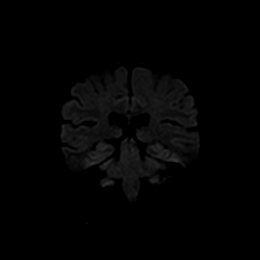
[im 53/66]
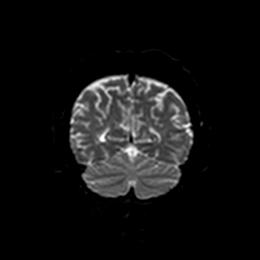
[im 66/66]
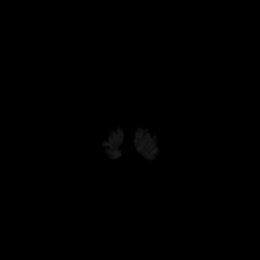

[Series 8: DWI · coronal · 4.0mm · 0.88mm/px · 3 of 33 slices shown (4 of 4)]
[im 1/33]
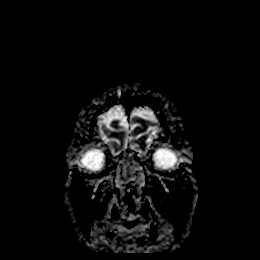
[im 17/33]
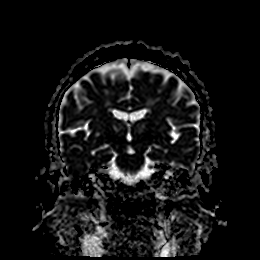
[im 33/33]
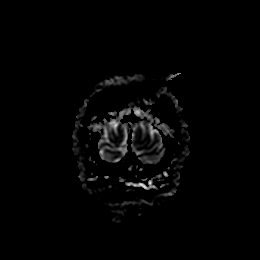

[Series 9: T1 · sagittal · 5.0mm · 0.75mm/px · 2 of 25 slices shown]
[im 1/25]
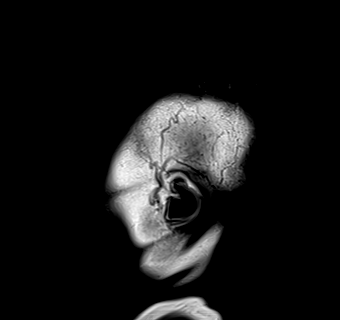
[im 25/25]
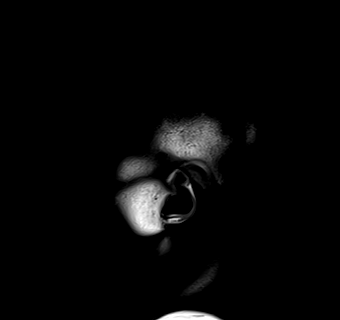

[Series 10: T2 · axial · 5.0mm · 0.72mm/px · z∈[-99,+44]mm · 2 of 25 slices shown (1 of 3)]
[im 1/25]
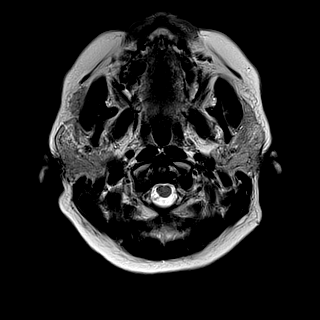
[im 25/25]
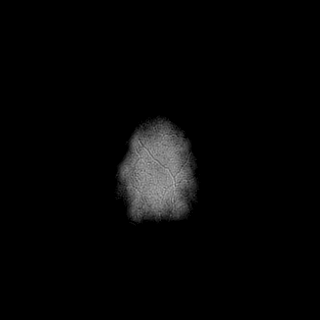

[Series 11: FLAIR · axial · 5.0mm · 0.45mm/px · z∈[-98,+46]mm · 2 of 25 slices shown]
[im 1/25]
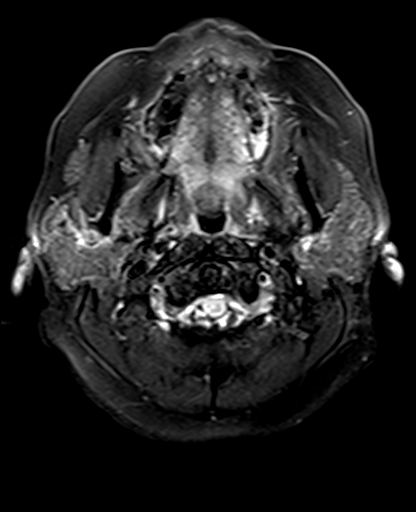
[im 25/25]
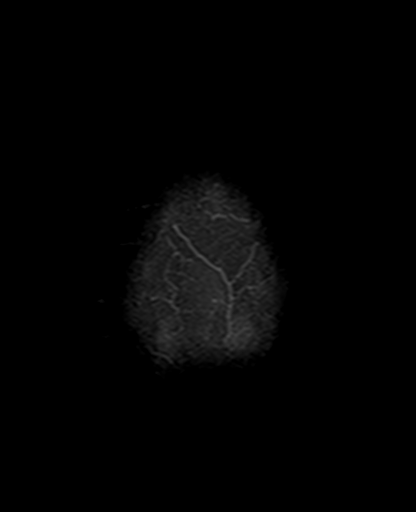

[Series 13: pha_images · axial · 3.0mm · 0.90mm/px · z∈[-114,+62]mm · 5 of 60 slices shown]
[im 1/60]
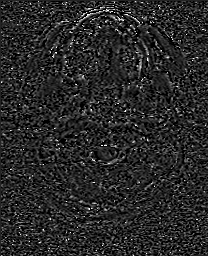
[im 15/60]
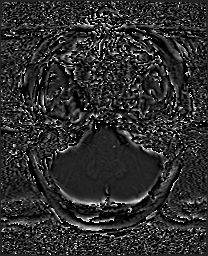
[im 30/60]
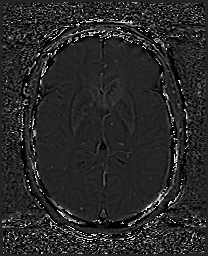
[im 45/60]
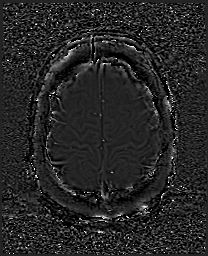
[im 60/60]
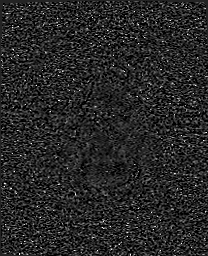

[Series 14: swi_images · axial · 3.0mm · 0.90mm/px · z∈[-114,+62]mm · 5 of 60 slices shown]
[im 1/60]
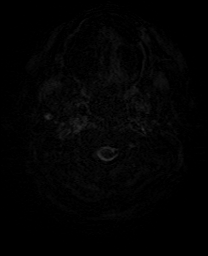
[im 15/60]
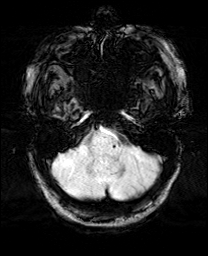
[im 30/60]
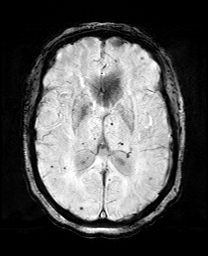
[im 45/60]
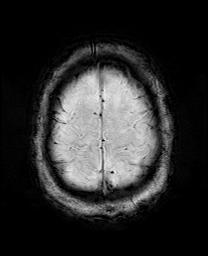
[im 60/60]
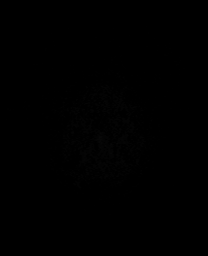

[Series 17: T2 · coronal · 5.0mm · 0.34mm/px · 3 of 29 slices shown (2 of 3)]
[im 1/29]
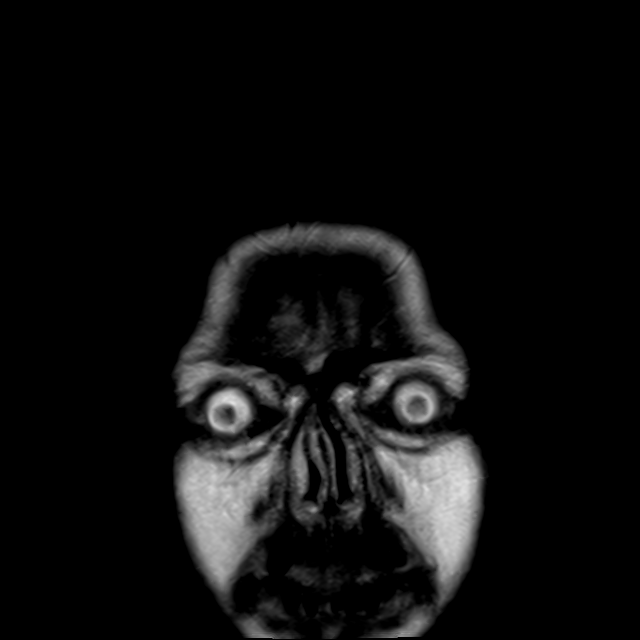
[im 15/29]
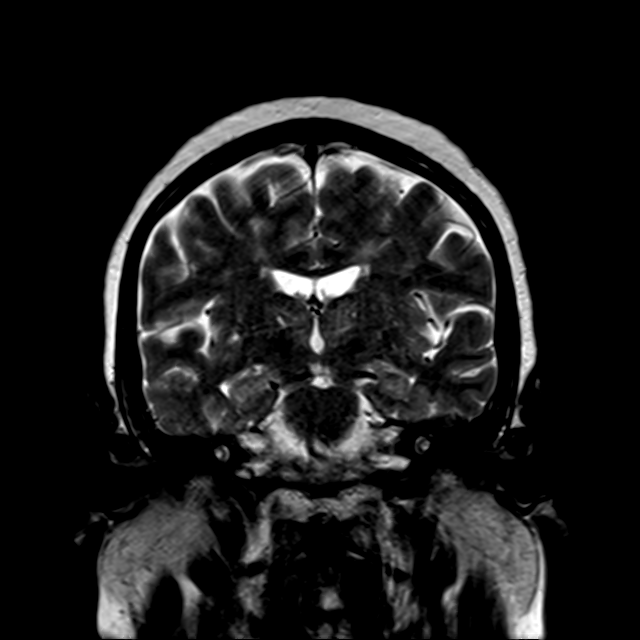
[im 29/29]
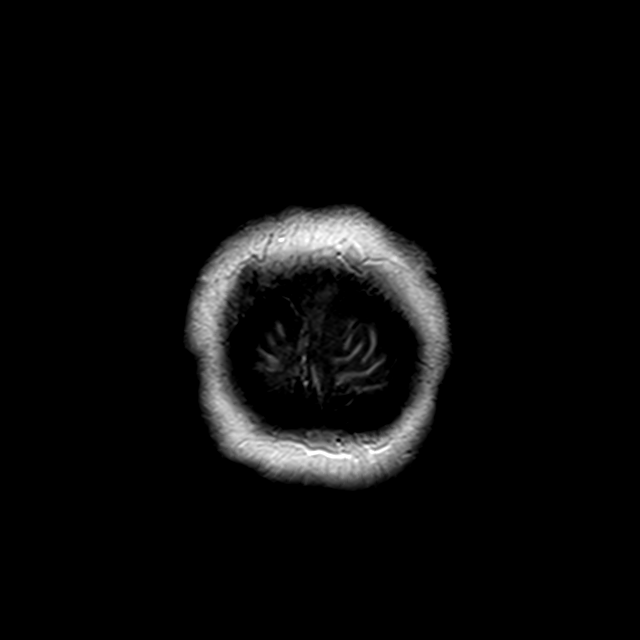

[Series 18: T2 · axial · 5.0mm · 0.72mm/px · z∈[-99,+44]mm · 2 of 25 slices shown (3 of 3)]
[im 1/25]
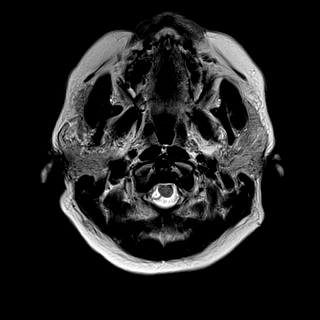
[im 25/25]
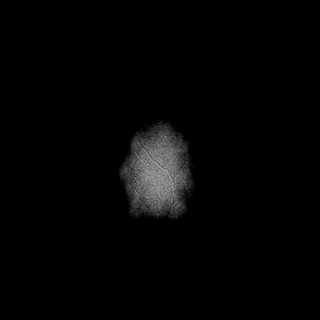

[43 of 48 positions shown; findings below may reference images not displayed]

FINDINGS: Brain: 2 subcentimeter acute white matter infarcts lateral to the
atrium of the right lateral ventricle.

Extensive chronic small vessel ischemia with confluent gliosis in
the periventricular white matter. Chronic lacunes at the right
thalamus and in the bilateral deep white matter tracks. Brain volume
is normal.

Numerous chronic microhemorrhages affecting basal ganglia,
brainstem, and the lobar brain. No acute hemorrhage

Vascular: Preserved flow voids

Skull and upper cervical spine: Normal marrow signal.

Sinuses/Orbits: Negative.
IMPRESSION: 1. Two subcentimeter acute white matter infarcts next to the atrium
of the right lateral ventricle.
2. Extensive chronic small vessel disease.
3. Chronic microhemorrhages in locations seen with both hypertension
and amyloid angiopathy.

## 2021-11-16 ENCOUNTER — Encounter (HOSPITAL_BASED_OUTPATIENT_CLINIC_OR_DEPARTMENT_OTHER): Payer: Self-pay

## 2021-11-16 ENCOUNTER — Emergency Department (HOSPITAL_BASED_OUTPATIENT_CLINIC_OR_DEPARTMENT_OTHER): Payer: Medicare Other

## 2021-11-16 ENCOUNTER — Emergency Department (HOSPITAL_BASED_OUTPATIENT_CLINIC_OR_DEPARTMENT_OTHER)
Admission: EM | Admit: 2021-11-16 | Discharge: 2021-11-16 | Disposition: A | Discharge status: Home / Self Care | Payer: Medicare Other | Attending: Emergency Medicine | Admitting: Emergency Medicine

## 2021-11-16 ENCOUNTER — Other Ambulatory Visit: Payer: Self-pay

## 2021-11-16 DIAGNOSIS — Z20822 Contact with and (suspected) exposure to covid-19: Secondary | ICD-10-CM | POA: Diagnosis not present

## 2021-11-16 DIAGNOSIS — Z7982 Long term (current) use of aspirin: Secondary | ICD-10-CM | POA: Diagnosis not present

## 2021-11-16 DIAGNOSIS — I10 Essential (primary) hypertension: Secondary | ICD-10-CM | POA: Insufficient documentation

## 2021-11-16 DIAGNOSIS — Z7984 Long term (current) use of oral hypoglycemic drugs: Secondary | ICD-10-CM | POA: Insufficient documentation

## 2021-11-16 DIAGNOSIS — Z79899 Other long term (current) drug therapy: Secondary | ICD-10-CM | POA: Insufficient documentation

## 2021-11-16 DIAGNOSIS — B349 Viral infection, unspecified: Secondary | ICD-10-CM

## 2021-11-16 DIAGNOSIS — J069 Acute upper respiratory infection, unspecified: Secondary | ICD-10-CM | POA: Diagnosis not present

## 2021-11-16 DIAGNOSIS — M791 Myalgia, unspecified site: Secondary | ICD-10-CM | POA: Insufficient documentation

## 2021-11-16 DIAGNOSIS — R001 Bradycardia, unspecified: Secondary | ICD-10-CM | POA: Diagnosis not present

## 2021-11-16 DIAGNOSIS — R059 Cough, unspecified: Secondary | ICD-10-CM | POA: Diagnosis present

## 2021-11-16 LAB — CBC
HCT: 34.3 % — ABNORMAL LOW (ref 36.0–46.0)
Hemoglobin: 11.4 g/dL — ABNORMAL LOW (ref 12.0–15.0)
MCH: 30.2 pg (ref 26.0–34.0)
MCHC: 33.2 g/dL (ref 30.0–36.0)
MCV: 91 fL (ref 80.0–100.0)
Platelets: 210 10*3/uL (ref 150–400)
RBC: 3.77 MIL/uL — ABNORMAL LOW (ref 3.87–5.11)
RDW: 15 % (ref 11.5–15.5)
WBC: 5.5 10*3/uL (ref 4.0–10.5)
nRBC: 0 % (ref 0.0–0.2)

## 2021-11-16 LAB — BASIC METABOLIC PANEL
Anion gap: 9 (ref 5–15)
BUN: 18 mg/dL (ref 8–23)
CO2: 28 mmol/L (ref 22–32)
Calcium: 9.6 mg/dL (ref 8.9–10.3)
Chloride: 102 mmol/L (ref 98–111)
Creatinine, Ser: 1.1 mg/dL — ABNORMAL HIGH (ref 0.44–1.00)
GFR, Estimated: 55 mL/min — ABNORMAL LOW (ref >60)
Glucose, Bld: 122 mg/dL — ABNORMAL HIGH (ref 70–99)
Potassium: 3.4 mmol/L — ABNORMAL LOW (ref 3.5–5.1)
Sodium: 139 mmol/L (ref 135–145)

## 2021-11-16 LAB — TROPONIN I (HIGH SENSITIVITY): Troponin I (High Sensitivity): 4 ng/L (ref <18)

## 2021-11-16 LAB — SARS CORONAVIRUS 2 BY RT PCR: SARS Coronavirus 2 by RT PCR: NEGATIVE

## 2021-11-16 MED ORDER — IBUPROFEN 800 MG PO TABS
800.0000 mg | ORAL_TABLET | Freq: Once | ORAL | Status: AC
  Administered 2021-11-16: 800 mg via ORAL
  Filled 2021-11-16: qty 1

## 2021-11-16 MED ORDER — ACETAMINOPHEN 500 MG PO TABS
1000.0000 mg | ORAL_TABLET | Freq: Once | ORAL | Status: AC
  Administered 2021-11-16: 1000 mg via ORAL
  Filled 2021-11-16: qty 2

## 2021-11-16 NOTE — ED Triage Notes (Addendum)
C/o generalized body aches for a few days with headache, eye pain, constipation, shortness of breath. Denies fever.   States took antihypertensive this morning.

## 2021-11-16 NOTE — ED Provider Notes (Signed)
[facility] Provider Note   CSN: [id number] Arrival date & time: 11/16/21  1958     History  Chief Complaint  Patient presents with   Generalized Body Aches    Krystal Vargas is a 66 y.o. female.  The history is provided by the patient.  URI Presenting symptoms: congestion, cough and sore throat   Presenting symptoms: no fever   Severity:  Moderate Onset quality:  Gradual Duration:  1 day Timing:  Constant Progression:  Unchanged Chronicity:  New Relieved by:  Nothing Worsened by:  Nothing Ineffective treatments:  None tried Associated symptoms: headaches and myalgias   Associated symptoms: no arthralgias, no neck pain and no wheezing   Risk factors: no immunosuppression   Also pain with coughing in a patient with HTN.  No DOE.  No CP, no n/v/d.       Home Medications Prior to Admission medications   Medication Sig Start Date End Date Taking? Authorizing Provider  aspirin EC 325 MG EC tablet Take 1 tablet (325 mg total) by mouth daily. 02/06/19   Geradine Girt, DO  atorvastatin (LIPITOR) 80 MG tablet Take 1 tablet (80 mg total) by mouth daily at 6 PM. 02/05/19   Eliseo Squires, Jessica U, DO  GARLIC PO Take 1 tablet by mouth daily.    [provider]  glipiZIDE (GLUCOTROL XL) 5 MG 24 hr tablet Take 5 mg by mouth daily with breakfast.    [provider]  hydrochlorothiazide (HYDRODIURIL) 25 MG tablet Take 25 mg by mouth daily. 08/17/19   [provider]  isosorbide mononitrate (IMDUR) 30 MG 24 hr tablet Take 30 mg by mouth daily. 05/11/19   [provider]  metFORMIN (GLUMETZA) 500 MG (MOD) 24 hr tablet Take 2 tablets (1,000 mg total) by mouth 2 (two) times daily with a meal. 02/07/19   Geradine Girt, DO  Multiple Vitamins-Minerals (MULTIPLE VITAMINS/WOMENS) tablet Take 1 tablet by mouth daily.    [provider]  nitroGLYCERIN (NITROSTAT) 0.4 MG SL tablet Place 1 tablet (0.4 mg total) under the tongue every 5  (five) minutes as needed for chest pain. 06/10/18   Norval Morton, MD  Omega-3 Fatty Acids (FISH OIL) 1000 MG CAPS Take 1,000 mg by mouth daily.    [provider]  omeprazole (PRILOSEC) 40 MG capsule Take 40 mg by mouth daily.  08/10/19   [provider]  Polyethyl Glycol-Propyl Glycol (SYSTANE OP) Place 1 drop into both eyes daily.     [provider]  potassium chloride (KLOR-CON) 10 MEQ tablet Take 10 mEq by mouth daily. 07/20/19   [provider]  sodium chloride (OCEAN) 0.65 % SOLN nasal spray Place 1 spray into both nostrils as needed for congestion.    [provider]  thiamine (VITAMIN B-1) 100 MG tablet Take 100 mg by mouth daily.    [provider]      Allergies    Amlodipine, Metoprolol, Doxycycline, and Prednisone    Review of Systems   Review of Systems  Constitutional:  Negative for diaphoresis and fever.  HENT:  Positive for congestion and sore throat.   Eyes:  Negative for photophobia and redness.  Respiratory:  Positive for cough. Negative for shortness of breath, wheezing and stridor.   Cardiovascular:  Negative for chest pain, palpitations and leg swelling.  Gastrointestinal:  Negative for abdominal pain.  Musculoskeletal:  Positive for myalgias. Negative for arthralgias and neck pain.  Neurological:  Positive for headaches.  All other systems reviewed and are negative.   Physical Exam Updated Vital Signs BP (!) 198/75 (BP Location: Right Arm)   Pulse (!) 55   Temp 98.5 F (36.9 C) (Oral)   Resp 19   Ht '5\' 2"'$  (1.575 m)   Wt 68.9 kg   LMP 05/16/2012   SpO2 98%   BMI 27.80 kg/m  Physical Exam Vitals and nursing note reviewed.  Constitutional:      General: She is not in acute distress.    Appearance: Normal appearance. She is well-developed.  HENT:     Head: Normocephalic and atraumatic.     Nose: Nose normal.  Eyes:     Pupils: Pupils are equal, round, and reactive to light.  Cardiovascular:      Rate and Rhythm: Normal rate and regular rhythm.     Pulses: Normal pulses.     Heart sounds: Normal heart sounds.  Pulmonary:     Effort: Pulmonary effort is normal. No respiratory distress.     Breath sounds: Normal breath sounds.  Abdominal:     General: Bowel sounds are normal. There is no distension.     Palpations: Abdomen is soft.     Tenderness: There is no abdominal tenderness. There is no guarding or rebound.  Genitourinary:    Vagina: No vaginal discharge.  Musculoskeletal:        General: No swelling or tenderness. Normal range of motion.     Cervical back: Normal range of motion and neck supple.  Skin:    General: Skin is warm and dry.     Capillary Refill: Capillary refill takes less than 2 seconds.     Findings: No erythema or rash.  Neurological:     General: No focal deficit present.     Mental Status: She is alert and oriented to person, place, and time.     Sensory: No sensory deficit.  Psychiatric:        Mood and Affect: Mood normal.        Behavior: Behavior normal.     ED Results / Procedures / Treatments   Labs (all labs ordered are listed, but only abnormal results are displayed) Labs Reviewed  BASIC METABOLIC PANEL - Abnormal; Notable for the following components:      Result Value   Potassium 3.4 (*)    Glucose, Bld 122 (*)    Creatinine, Ser 1.10 (*)    GFR, Estimated 55 (*)    All other components within normal limits  CBC - Abnormal; Notable for the following components:   RBC 3.77 (*)    Hemoglobin 11.4 (*)    HCT 34.3 (*)    All other components within normal limits  SARS CORONAVIRUS 2 BY RT PCR  TROPONIN I (HIGH SENSITIVITY)  TROPONIN I (HIGH SENSITIVITY)    EKG EKG Interpretation  Date/Time:  Friday November 16 2021 20:09:30 EDT Ventricular Rate:  52 PR Interval:  152 QRS Duration: 86 QT Interval:  430 QTC Calculation: 399 R Axis:   23 Text Interpretation: Sinus bradycardia Confirmed by Dory Horn) on 11/16/2021  10:59:55 PM  Radiology DG Chest 2 View  Result Date: 11/16/2021 CLINICAL DATA:  Shortness of breath. EXAM: CHEST - 2 VIEW COMPARISON:  Chest x-ray 06/09/2018 FINDINGS: The heart size and mediastinal contours are within normal limits. Both lungs are clear. The visualized skeletal structures are unremarkable. IMPRESSION: No active cardiopulmonary disease. Electronically Signed   By: Ronney Asters M.D.   On: 11/16/2021 20:49  Procedures Procedures    Medications Ordered in ED Medications  ibuprofen (ADVIL) tablet 800 mg (has no administration in time range)  acetaminophen (TYLENOL) tablet 1,000 mg (has no administration in time range)    ED Course/ Medical Decision Making/ A&P                           Medical Decision Making URI and bodyaches today with cough.  Has not taken anything   Amount and/or Complexity of Data Reviewed External Data Reviewed: notes.    Details: previous notes reviewed Labs: ordered.    Details: all labs reviewed:  normal troponin with 24 hours of symptoms.  Normal white count, hemoglobin slightly low 11.4 normal platelets.  Normal sodium and BUN creatinine is slightly high at 1.1 Radiology: ordered and independent interpretation performed.    Details: normal CXR by me ECG/medicine tests: ordered and independent interpretation performed. Decision-making details documented in ED Course.  Risk OTC drugs. Prescription drug management. Risk Details: This is not cardiac.  Ruled out based on EKG, negative troponin and time course.  CXR negative for PNA.  Symptoms are consistent with a viral illness.  Alternate tylenol and ibuprofen.  Follow up with your pMD, strict return precautions given.       Final Clinical Impression(s) / ED Diagnoses Final diagnoses:  None   Return for intractable cough, coughing up blood, fevers > 100.4 unrelieved by medication, shortness of breath, intractable vomiting, chest pain, shortness of breath, weakness, numbness, changes  in speech, facial asymmetry, abdominal pain, passing out, Inability to tolerate liquids or food, cough, altered mental status or any concerns. No signs of systemic illness or infection. The patient is nontoxic-appearing on exam and vital signs are within normal limits.  I have reviewed the triage vital signs and the nursing notes. Pertinent labs & imaging results that were available during my care of the patient were reviewed by me and considered in my medical decision making (see chart for details). After history, exam, and medical workup I feel the patient has been appropriately medically screened and is safe for discharge home. Pertinent diagnoses were discussed with the patient. Patient was given return precautions.  Rx / DC Orders ED Discharge Orders     None         Murl Golladay, MD 11/16/21 2332

## 2022-02-14 ENCOUNTER — Emergency Department (HOSPITAL_BASED_OUTPATIENT_CLINIC_OR_DEPARTMENT_OTHER): Payer: Medicare Other

## 2022-02-14 ENCOUNTER — Other Ambulatory Visit: Payer: Self-pay

## 2022-02-14 ENCOUNTER — Encounter (HOSPITAL_BASED_OUTPATIENT_CLINIC_OR_DEPARTMENT_OTHER): Payer: Self-pay | Admitting: Urology

## 2022-02-14 ENCOUNTER — Observation Stay (HOSPITAL_BASED_OUTPATIENT_CLINIC_OR_DEPARTMENT_OTHER)
Admission: EM | Admit: 2022-02-14 | Discharge: 2022-02-16 | Disposition: A | Discharge status: Home Health | Payer: Medicare Other | Attending: Internal Medicine | Admitting: Internal Medicine

## 2022-02-14 DIAGNOSIS — E11649 Type 2 diabetes mellitus with hypoglycemia without coma: Secondary | ICD-10-CM | POA: Diagnosis not present

## 2022-02-14 DIAGNOSIS — Z8673 Personal history of transient ischemic attack (TIA), and cerebral infarction without residual deficits: Secondary | ICD-10-CM | POA: Diagnosis not present

## 2022-02-14 DIAGNOSIS — Z7984 Long term (current) use of oral hypoglycemic drugs: Secondary | ICD-10-CM | POA: Insufficient documentation

## 2022-02-14 DIAGNOSIS — Z79899 Other long term (current) drug therapy: Secondary | ICD-10-CM | POA: Insufficient documentation

## 2022-02-14 DIAGNOSIS — R55 Syncope and collapse: Secondary | ICD-10-CM | POA: Diagnosis not present

## 2022-02-14 DIAGNOSIS — Z7982 Long term (current) use of aspirin: Secondary | ICD-10-CM | POA: Diagnosis not present

## 2022-02-14 DIAGNOSIS — R479 Unspecified speech disturbances: Secondary | ICD-10-CM

## 2022-02-14 DIAGNOSIS — R001 Bradycardia, unspecified: Secondary | ICD-10-CM | POA: Diagnosis not present

## 2022-02-14 DIAGNOSIS — E119 Type 2 diabetes mellitus without complications: Secondary | ICD-10-CM

## 2022-02-14 DIAGNOSIS — J45909 Unspecified asthma, uncomplicated: Secondary | ICD-10-CM | POA: Insufficient documentation

## 2022-02-14 DIAGNOSIS — I639 Cerebral infarction, unspecified: Secondary | ICD-10-CM | POA: Diagnosis present

## 2022-02-14 DIAGNOSIS — Z87891 Personal history of nicotine dependence: Secondary | ICD-10-CM | POA: Insufficient documentation

## 2022-02-14 DIAGNOSIS — R471 Dysarthria and anarthria: Principal | ICD-10-CM | POA: Diagnosis present

## 2022-02-14 DIAGNOSIS — I10 Essential (primary) hypertension: Secondary | ICD-10-CM | POA: Diagnosis not present

## 2022-02-14 DIAGNOSIS — R61 Generalized hyperhidrosis: Secondary | ICD-10-CM | POA: Diagnosis present

## 2022-02-14 DIAGNOSIS — E162 Hypoglycemia, unspecified: Secondary | ICD-10-CM

## 2022-02-14 LAB — URINALYSIS, ROUTINE W REFLEX MICROSCOPIC
Bilirubin Urine: NEGATIVE
Glucose, UA: NEGATIVE mg/dL
Ketones, ur: NEGATIVE mg/dL
Leukocytes,Ua: NEGATIVE
Nitrite: NEGATIVE
Protein, ur: NEGATIVE mg/dL
Specific Gravity, Urine: 1.02 (ref 1.005–1.030)
pH: 5.5 (ref 5.0–8.0)

## 2022-02-14 LAB — TROPONIN I (HIGH SENSITIVITY): Troponin I (High Sensitivity): 4 ng/L (ref <18)

## 2022-02-14 LAB — CBC
HCT: 34.8 % — ABNORMAL LOW (ref 36.0–46.0)
Hemoglobin: 11.4 g/dL — ABNORMAL LOW (ref 12.0–15.0)
MCH: 30.9 pg (ref 26.0–34.0)
MCHC: 32.8 g/dL (ref 30.0–36.0)
MCV: 94.3 fL (ref 80.0–100.0)
Platelets: 214 10*3/uL (ref 150–400)
RBC: 3.69 MIL/uL — ABNORMAL LOW (ref 3.87–5.11)
RDW: 15.3 % (ref 11.5–15.5)
WBC: 5.1 10*3/uL (ref 4.0–10.5)
nRBC: 0 % (ref 0.0–0.2)

## 2022-02-14 LAB — BASIC METABOLIC PANEL
Anion gap: 6 (ref 5–15)
BUN: 20 mg/dL (ref 8–23)
CO2: 30 mmol/L (ref 22–32)
Calcium: 9 mg/dL (ref 8.9–10.3)
Chloride: 101 mmol/L (ref 98–111)
Creatinine, Ser: 0.82 mg/dL (ref 0.44–1.00)
GFR, Estimated: >60 mL/min (ref >60)
Glucose, Bld: 118 mg/dL — ABNORMAL HIGH (ref 70–99)
Potassium: 3.4 mmol/L — ABNORMAL LOW (ref 3.5–5.1)
Sodium: 137 mmol/L (ref 135–145)

## 2022-02-14 LAB — URINALYSIS, MICROSCOPIC (REFLEX)

## 2022-02-14 LAB — CBG MONITORING, ED: Glucose-Capillary: 69 mg/dL — ABNORMAL LOW (ref 70–99)

## 2022-02-14 MED ORDER — DEXTROSE 50 % IV SOLN
50.0000 mL | Freq: Once | INTRAVENOUS | Status: AC
  Administered 2022-02-14: 50 mL via INTRAVENOUS
  Filled 2022-02-14: qty 50

## 2022-02-14 NOTE — ED Notes (Signed)
Pt states that she is feeling a little better after the dextrose.

## 2022-02-14 NOTE — ED Notes (Signed)
Attempt at IV, got blood for labs but infiltrated when flushed.

## 2022-02-14 NOTE — ED Notes (Signed)
Patient transported to X-ray 

## 2022-02-14 NOTE — ED Notes (Signed)
Accompanied pt to CT monitored. Pt's VS remained stable during transport.

## 2022-02-14 NOTE — ED Notes (Signed)
Pt returned from restroom with new onset stutter and left hip pain. MD at bedside and called code stroke.

## 2022-02-14 NOTE — ED Provider Notes (Signed)
[facility] Provider Note: Georgena Spurling, MD, FACEP  CSN: [id number] MRN: [id number] ARRIVAL: 02/14/22 at [place]: [place]  Night Sweats   HISTORY OF PRESENT ILLNESS  02/14/22 11:02 PM Krystal Vargas is a 66 y.o. female with a history of a stroke 3 years ago.  She states that as result of the stroke it manifested with difficulty speaking ("stutter"), left hip pain and tremor.  She is here for awakening about 2 AM the past 3 mornings with similar symptoms.  These are associated with sweating, generalized shaking, pain in her left hip and difficulty speaking.  Prior to my evaluation the patient was asked to go to the bathroom to provide a urine specimen on returning from the bathroom to her room she developed pain in her left hip and began having difficulty speaking (stuttering and difficulty pronouncing words).  She also became tremulous.  She indicated to me that these are the same symptoms she has been having during these morning events as well as the previous stroke.  A code stroke was immediately called.   Past Medical History:  Diagnosis Date   Asthma    Cancer ([place])    Diabetes mellitus    Fibromyalgia    High cholesterol    Hypertension    Stroke [facility])     Past Surgical History:  Procedure Laterality Date   TUBAL LIGATION      Family History  Problem Relation Age of Onset   Hypertension Mother    Diabetes Mellitus II Mother    Bronchitis Father    Hypertension Sister    Diabetes Mellitus II Sister    Hypertension Brother    Diabetes Mellitus II Brother     Social History   Tobacco Use   Smoking status: Former   Smokeless tobacco: Never  Scientific laboratory technician Use: Never used  Substance Use Topics   Alcohol use: Yes    Comment: occ   Drug use: No    Prior to Admission medications   Medication Sig Start Date End Date Taking? Authorizing Provider  aspirin EC 325 MG EC tablet Take 1 tablet (325 mg total) by mouth daily. 02/06/19    Geradine Girt, DO  atorvastatin (LIPITOR) 80 MG tablet Take 1 tablet (80 mg total) by mouth daily at 6 PM. 02/05/19   Eliseo Squires, Jessica U, DO  GARLIC PO Take 1 tablet by mouth daily.    [provider]  glipiZIDE (GLUCOTROL XL) 5 MG 24 hr tablet Take 5 mg by mouth daily with breakfast.    [provider]  hydrochlorothiazide (HYDRODIURIL) 25 MG tablet Take 25 mg by mouth daily. 08/17/19   [provider]  isosorbide mononitrate (IMDUR) 30 MG 24 hr tablet Take 30 mg by mouth daily. 05/11/19   [provider]  metFORMIN (GLUMETZA) 500 MG (MOD) 24 hr tablet Take 2 tablets (1,000 mg total) by mouth 2 (two) times daily with a meal. 02/07/19   Geradine Girt, DO  Multiple Vitamins-Minerals (MULTIPLE VITAMINS/WOMENS) tablet Take 1 tablet by mouth daily.    [provider]  nitroGLYCERIN (NITROSTAT) 0.4 MG SL tablet Place 1 tablet (0.4 mg total) under the tongue every 5 (five) minutes as needed for chest pain. 06/10/18   Norval Morton, MD  Omega-3 Fatty Acids (FISH OIL) 1000 MG CAPS Take 1,000 mg by mouth daily.    [provider]  omeprazole (PRILOSEC) 40 MG capsule Take 40 mg by mouth  daily.  08/10/19   [provider]  Polyethyl Glycol-Propyl Glycol (SYSTANE OP) Place 1 drop into both eyes daily.     [provider]  potassium chloride (KLOR-CON) 10 MEQ tablet Take 10 mEq by mouth daily. 07/20/19   [provider]  sodium chloride (OCEAN) 0.65 % SOLN nasal spray Place 1 spray into both nostrils as needed for congestion.    [provider]  thiamine (VITAMIN B-1) 100 MG tablet Take 100 mg by mouth daily.    [provider]    Allergies Amlodipine, Metoprolol, Doxycycline, and Prednisone   REVIEW OF SYSTEMS  Negative except as noted here or in the History of Present Illness.   PHYSICAL EXAMINATION  Initial Vital Signs Blood pressure (!) 183/71, pulse (!) 47, temperature 98.3 F (36.8 C), temperature  source Oral, resp. rate 18, height '5\' 2"'$  (1.575 m), weight 68.9 kg, last menstrual period 05/16/2012, SpO2 100 %.  Examination General: Well-developed, well-nourished female in no acute distress; appearance consistent with age of record HENT: normocephalic; atraumatic Eyes: pupils equal, round and reactive to light; extraocular muscles intact Neck: supple Heart: regular rate and rhythm Lungs: clear to auscultation bilaterally Abdomen: soft; nondistended; nontender; bowel sounds present Extremities: No deformity; full range of motion; pulses normal Neurologic: Awake, alert and oriented; difficulty pronouncing words; motor function intact in all extremities and symmetric; no facial droop; generalized tremor Skin: Warm and dry Psychiatric: Flat affect   RESULTS  Summary of this visit's results, reviewed and interpreted by myself:   EKG Interpretation  Date/Time:    Ventricular Rate:    PR Interval:    QRS Duration:   QT Interval:    QTC Calculation:   R Axis:     Text Interpretation:         Laboratory Studies: Results for orders placed or performed during the hospital encounter of 02/14/22 (from the past 24 hour(s))  Basic metabolic panel     Status: Abnormal   Collection Time: 02/14/22  8:04 PM  Result Value Ref Range   Sodium 137 135 - 145 mmol/L   Potassium 3.4 (L) 3.5 - 5.1 mmol/L   Chloride 101 98 - 111 mmol/L   CO2 30 22 - 32 mmol/L   Glucose, Bld 118 (H) 70 - 99 mg/dL   BUN 20 8 - 23 mg/dL   Creatinine, Ser 0.82 0.44 - 1.00 mg/dL   Calcium 9.0 8.9 - 10.3 mg/dL   GFR, Estimated >60 >60 mL/min   Anion gap 6 5 - 15  CBC     Status: Abnormal   Collection Time: 02/14/22  8:04 PM  Result Value Ref Range   WBC 5.1 4.0 - 10.5 K/uL   RBC 3.69 (L) 3.87 - 5.11 MIL/uL   Hemoglobin 11.4 (L) 12.0 - 15.0 g/dL   HCT 34.8 (L) 36.0 - 46.0 %   MCV 94.3 80.0 - 100.0 fL   MCH 30.9 26.0 - 34.0 pg   MCHC 32.8 30.0 - 36.0 g/dL   RDW 15.3 11.5 - 15.5 %   Platelets 214 150 - 400  K/uL   nRBC 0.0 0.0 - 0.2 %  Troponin I (High Sensitivity)     Status: None   Collection Time: 02/14/22  8:04 PM  Result Value Ref Range   Troponin I (High Sensitivity) 4 <18 ng/L  Urinalysis, Routine w reflex microscopic     Status: Abnormal   Collection Time: 02/14/22 11:21 PM  Result Value Ref Range   Color, Urine YELLOW  YELLOW   APPearance CLEAR CLEAR   Specific Gravity, Urine 1.020 1.005 - 1.030   pH 5.5 5.0 - 8.0   Glucose, UA NEGATIVE NEGATIVE mg/dL   Hgb urine dipstick SMALL (A) NEGATIVE   Bilirubin Urine NEGATIVE NEGATIVE   Ketones, ur NEGATIVE NEGATIVE mg/dL   Protein, ur NEGATIVE NEGATIVE mg/dL   Nitrite NEGATIVE NEGATIVE   Leukocytes,Ua NEGATIVE NEGATIVE  Urinalysis, Microscopic (reflex)     Status: Abnormal   Collection Time: 02/14/22 11:21 PM  Result Value Ref Range   RBC / HPF 0-5 0 - 5 RBC/hpf   WBC, UA 0-5 0 - 5 WBC/hpf   Bacteria, UA RARE (A) NONE SEEN   Squamous Epithelial / LPF 0-5 0 - 5  CBG monitoring, ED     Status: Abnormal   Collection Time: 02/14/22 11:31 PM  Result Value Ref Range   Glucose-Capillary 69 (L) 70 - 99 mg/dL  Troponin I (High Sensitivity)     Status: None   Collection Time: 02/14/22 11:59 PM  Result Value Ref Range   Troponin I (High Sensitivity) 6 <18 ng/L   Imaging Studies: CT HEAD CODE STROKE WO CONTRAST`  Result Date: 02/14/2022 CLINICAL DATA:  Code stroke. Initial evaluation for acute neuro deficit, stroke suspected, aphasia. EXAM: CT HEAD WITHOUT CONTRAST TECHNIQUE: Contiguous axial images were obtained from the base of the skull through the vertex without intravenous contrast. RADIATION DOSE REDUCTION: This exam was performed according to the departmental dose-optimization program which includes automated exposure control, adjustment of the mA and/or kV according to patient size and/or use of iterative reconstruction technique. COMPARISON:  Prior study from 09/16/2019. FINDINGS: Brain: Cerebral volume within normal limits. Chronic  microvascular ischemic disease with a few scattered remote lacunar infarcts noted, similar to prior. No acute intracranial hemorrhage. No acute large vessel territory infarct. No mass lesion or midline shift. No hydrocephalus or extra-axial fluid collection. Vascular: No hyperdense vessel. Calcified atherosclerosis present at the skull base. Skull: Scalp soft tissues and calvarium within normal limits. Sinuses/Orbits: Globes and orbital soft tissues within normal limits. Paranasal sinuses are clear. No mastoid effusion. Other: None. ASPECTS [facility] Stroke Program Early CT Score) - Ganglionic level infarction (caudate, lentiform nuclei, internal capsule, insula, M1-M3 cortex): 7 - Supraganglionic infarction (M4-M6 cortex): 3 Total score (0-10 with 10 being normal): 10 IMPRESSION: 1. No acute intracranial abnormality. 2. ASPECTS is 10. 3. Moderately advanced chronic microvascular ischemic disease, similar to prior. Critical Value/emergent results were called by telephone at the time of interpretation on 02/14/2022 at 11:43 pm to provider [facility] , who verbally acknowledged these results. Electronically Signed   By: Jeannine Boga M.D.   On: 02/14/2022 23:45   DG Chest 2 View  Result Date: 02/14/2022 CLINICAL DATA:  States last 3 nights has been waking up sweaty and feels like " my body is restless" States HA, denies chest pain, reports SOB Pt hypertensive at triage, H/O [place] states was taken off of hydralazine, still taking losartan EXAM: CHEST - 2 VIEW COMPARISON:  11/16/2021 FINDINGS: Lungs are clear. Heart size upper limits normal for technique. No effusion. Visualized bones unremarkable. IMPRESSION: No acute cardiopulmonary disease. Electronically Signed   By: Lucrezia Europe M.D.   On: 02/14/2022 20:16    ED COURSE and MDM  Nursing notes, initial and subsequent vitals signs, including pulse oximetry, reviewed and interpreted by myself.  Vitals:   02/14/22 2254 02/14/22 2340 02/14/22 2348 02/15/22  0015  BP: (!) 183/71 (!) 191/109 (!) 131/111 (!) 161/72  Pulse: (!) 47 75 78 (!) 50  Resp: 18 18 (!) 23 14  Temp:      TempSrc:      SpO2: 100% 97% 97% 99%  Weight:      Height:       Medications  dextrose 50 % solution 50 mL (50 mLs Intravenous Given 02/14/22 2341)    11:21 PM Code Stroke called.  11:33 PM CBG 69.  Will give D50 as hypoglycemia can mimic stroke symptoms.  12:06 AM Teleneurology has evaluated the patient and does not recommend thrombolytics.  The patient was not is emphatic about her hip pain being associated with her other symptoms and the teleneurologist suspects the stuttering may be more psychogenic than neurologic.  She does recommend the patient be admitted for MRI.  Since hypoglycemia can mimic a stroke the patient may have been having hypoglycemic episodes the past 3 mornings.  This could explain the tremulousness and other symptoms.  She does not normally check her sugar except once in the mornings.  Her stutter and other symptoms have not resolved after administration of D50.  Dr. Myna Hidalgo to admit to the hospitalist service.  PROCEDURES  Procedures CRITICAL CARE Performed by: Karen Chafe Sascha Baugher Total critical care time: 40 minutes Critical care time was exclusive of separately billable procedures and treating other patients. Critical care was necessary to treat or prevent imminent or life-threatening deterioration. Critical care was time spent personally by me on the following activities: development of treatment plan with patient and/or surrogate as well as nursing, discussions with consultants, evaluation of patient's response to treatment, examination of patient, obtaining history from patient or surrogate, ordering and performing treatments and interventions, ordering and review of laboratory studies, ordering and review of radiographic studies, pulse oximetry and re-evaluation of patient's condition.   ED DIAGNOSES     ICD-10-CM   1. Speaking difficulty   R47.9     2. Hypoglycemia  E16.2          Shanon Rosser, MD 02/15/22 [phone]

## 2022-02-14 NOTE — ED Triage Notes (Signed)
States last 3 nights has been waking up sweaty and feels like " my body is restless"  States HA, denies chest pain, reports SOB  Pt hypertensive at triage, H/O [place]  states was taken off of hydralazine, still taking losartan, hctz, took today  NAD noted at this time, A&O x 4

## 2022-02-15 ENCOUNTER — Encounter (HOSPITAL_COMMUNITY): Payer: Self-pay

## 2022-02-15 ENCOUNTER — Observation Stay (HOSPITAL_COMMUNITY): Payer: Medicare Other

## 2022-02-15 DIAGNOSIS — Z79899 Other long term (current) drug therapy: Secondary | ICD-10-CM | POA: Diagnosis not present

## 2022-02-15 DIAGNOSIS — J45909 Unspecified asthma, uncomplicated: Secondary | ICD-10-CM | POA: Diagnosis not present

## 2022-02-15 DIAGNOSIS — E119 Type 2 diabetes mellitus without complications: Secondary | ICD-10-CM | POA: Diagnosis not present

## 2022-02-15 DIAGNOSIS — Z8673 Personal history of transient ischemic attack (TIA), and cerebral infarction without residual deficits: Secondary | ICD-10-CM | POA: Diagnosis not present

## 2022-02-15 DIAGNOSIS — I639 Cerebral infarction, unspecified: Secondary | ICD-10-CM | POA: Diagnosis present

## 2022-02-15 DIAGNOSIS — Z7982 Long term (current) use of aspirin: Secondary | ICD-10-CM | POA: Diagnosis not present

## 2022-02-15 DIAGNOSIS — E11649 Type 2 diabetes mellitus with hypoglycemia without coma: Secondary | ICD-10-CM | POA: Diagnosis not present

## 2022-02-15 DIAGNOSIS — Z87891 Personal history of nicotine dependence: Secondary | ICD-10-CM | POA: Diagnosis not present

## 2022-02-15 DIAGNOSIS — R471 Dysarthria and anarthria: Secondary | ICD-10-CM | POA: Diagnosis present

## 2022-02-15 DIAGNOSIS — I10 Essential (primary) hypertension: Secondary | ICD-10-CM | POA: Diagnosis not present

## 2022-02-15 DIAGNOSIS — R61 Generalized hyperhidrosis: Secondary | ICD-10-CM | POA: Diagnosis present

## 2022-02-15 DIAGNOSIS — Z7984 Long term (current) use of oral hypoglycemic drugs: Secondary | ICD-10-CM | POA: Diagnosis not present

## 2022-02-15 DIAGNOSIS — R001 Bradycardia, unspecified: Secondary | ICD-10-CM | POA: Diagnosis not present

## 2022-02-15 DIAGNOSIS — R55 Syncope and collapse: Secondary | ICD-10-CM | POA: Diagnosis not present

## 2022-02-15 LAB — CBG MONITORING, ED: Glucose-Capillary: 146 mg/dL — ABNORMAL HIGH (ref 70–99)

## 2022-02-15 LAB — TROPONIN I (HIGH SENSITIVITY): Troponin I (High Sensitivity): 6 ng/L (ref <18)

## 2022-02-15 LAB — GLUCOSE, CAPILLARY: Glucose-Capillary: 117 mg/dL — ABNORMAL HIGH (ref 70–99)

## 2022-02-15 MED ORDER — METFORMIN HCL ER 500 MG PO TB24
1000.0000 mg | ORAL_TABLET | Freq: Two times a day (BID) | ORAL | Status: DC
  Administered 2022-02-16 (×2): 1000 mg via ORAL
  Filled 2022-02-15 (×4): qty 2

## 2022-02-15 MED ORDER — INSULIN ASPART 100 UNIT/ML IJ SOLN: 0.0000 [IU] | Freq: Three times a day (TID) | INTRAMUSCULAR | Status: DC

## 2022-02-15 MED ORDER — ENOXAPARIN SODIUM 40 MG/0.4ML IJ SOSY
40.0000 mg | PREFILLED_SYRINGE | INTRAMUSCULAR | Status: DC
  Administered 2022-02-15 – 2022-02-16 (×2): 40 mg via SUBCUTANEOUS
  Filled 2022-02-15 (×2): qty 0.4

## 2022-02-15 MED ORDER — ACETAMINOPHEN 650 MG RE SUPP: 650.0000 mg | RECTAL | Status: DC | PRN

## 2022-02-15 MED ORDER — ATORVASTATIN CALCIUM 80 MG PO TABS
80.0000 mg | ORAL_TABLET | Freq: Every day | ORAL | Status: DC
  Administered 2022-02-15 – 2022-02-16 (×2): 80 mg via ORAL
  Filled 2022-02-15 (×2): qty 1

## 2022-02-15 MED ORDER — STROKE: EARLY STAGES OF RECOVERY BOOK
Freq: Once | Status: AC
  Filled 2022-02-15: qty 1

## 2022-02-15 MED ORDER — IOHEXOL 350 MG/ML SOLN
75.0000 mL | Freq: Once | INTRAVENOUS | Status: AC | PRN
  Administered 2022-02-15: 75 mL via INTRAVENOUS

## 2022-02-15 MED ORDER — ACETAMINOPHEN 325 MG PO TABS: 650.0000 mg | ORAL_TABLET | ORAL | Status: DC | PRN

## 2022-02-15 MED ORDER — HYDRALAZINE HCL 20 MG/ML IJ SOLN: 5.0000 mg | Freq: Four times a day (QID) | INTRAMUSCULAR | Status: DC | PRN

## 2022-02-15 MED ORDER — ENALAPRILAT 1.25 MG/ML IV SOLN: 1.2500 mg | Freq: Four times a day (QID) | INTRAVENOUS | Status: DC | PRN

## 2022-02-15 MED ORDER — PANTOPRAZOLE SODIUM 40 MG PO TBEC
40.0000 mg | DELAYED_RELEASE_TABLET | Freq: Every day | ORAL | Status: DC
  Administered 2022-02-15 – 2022-02-16 (×2): 40 mg via ORAL
  Filled 2022-02-15 (×2): qty 1

## 2022-02-15 MED ORDER — GLIPIZIDE ER 5 MG PO TB24: 5.0000 mg | ORAL_TABLET | Freq: Every day | ORAL | Status: DC

## 2022-02-15 MED ORDER — HYDROCHLOROTHIAZIDE 25 MG PO TABS
25.0000 mg | ORAL_TABLET | Freq: Every day | ORAL | Status: DC
  Administered 2022-02-15 – 2022-02-16 (×2): 25 mg via ORAL
  Filled 2022-02-15 (×2): qty 1

## 2022-02-15 MED ORDER — ACETAMINOPHEN 160 MG/5ML PO SOLN: 650.0000 mg | ORAL | Status: DC | PRN

## 2022-02-15 MED ORDER — ASPIRIN 325 MG PO TBEC
325.0000 mg | DELAYED_RELEASE_TABLET | Freq: Every day | ORAL | Status: DC
  Administered 2022-02-15 – 2022-02-16 (×2): 325 mg via ORAL
  Filled 2022-02-15 (×2): qty 1

## 2022-02-15 NOTE — Progress Notes (Addendum)
2322-Code stroke activated, MD at bedside  2325-patient taken to CT  2327-TS paged  2333-Dr. Corlis Leak joined teleneuro cart  2335-patient back from [place], Telestroke RN

## 2022-02-15 NOTE — ED Notes (Signed)
Pt walked to the restroom without difficulty. No complaints at this time.

## 2022-02-15 NOTE — Consult Note (Signed)
[facility] Tele[facility]urology Consult Services   Patient Name:   Krystal Vargas, Krystal Vargas Date of Birth:   [birth year] Identification Number:   MRN - [id number] Date of Service:   02/14/2022 23:27:38  Diagnosis:       F98.5 - Stuttering [stammering]  Impression:      66yoF hx of DM, HTN, HLD, CVA initially present with feeling restless, jittery, short of breath, headache for past 3 days. While at ED had sudden onset left hip pain and stuttering speech. She was noted to be hypoglycemic at 69. NIHSS 0 on exam, no aphasia. Speech change may be related to hypoglycemia vs pain-induced vs stroke recrudescence vs new CVA. In the setting of NIHSS 0 and patient currently does not work, I explained to the patient that the risk of thrombolytics outweigh the benefit. Patient voiced understanding and agreeable to that plan.  Given her risk factor, recommend admission for MRI brain w/o to rule out stroke, continue home aspirin, and permissive HTN SBP 180-200s in first 24hr then gradually lower by 20% daily.  Our recommendations are outlined below.  Recommendations:        Stroke/Telemetry Floor       Neuro Checks       Bedside Swallow Eval       DVT Prophylaxis       IV Fluids, Normal Saline       Head of Bed 30 Degrees       Euglycemia and Avoid Hyperthermia (PRN Acetaminophen)       Initiate or continue Aspirin 325 MG daily  Per facility request will defer further work up, management, and referrals to inpatient service, inclusive of inpatient neurology consult.  Sign Out:       Discussed with Emergency Department Provider    ------------------------------------------------------------------------------  Advanced Imaging: Advanced Imaging Deferred because:  Non-disabling symptoms as verified by the patient; no cortical signs so not consistent with LVO   Metrics: Last Known Well: 02/14/2022 23:00:00 [facility] Notification Time: 02/14/2022 23:27:37 Stamp Time: 02/14/2022  23:27:38 Initial Response Time: 02/14/2022 23:32:21 Symptoms: stuttering speech . Initial patient interaction: 02/14/2022 23:35:32 NIHSS Assessment Completed: 02/14/2022 23:48:35 Patient is not a candidate for Thrombolytic. Thrombolytic Medical Decision: 02/14/2022 23:48:36 Patient was not deemed candidate for Thrombolytic because of following reasons: No disabling symptoms. Other Diagnosis suspected.  CT head showed no acute hemorrhage or acute core infarct.  Primary Provider Notified of Diagnostic Impression and Management Plan on: 02/14/2022 23:58:50    ------------------------------------------------------------------------------  History of Present Illness: Patient is a 66 year old Female.  Patient initially came to hospital with multiple complaints. Per nursing reports she initially c/o of waking up sweating, feeling restless, headache, short of breath, jitterying for the past 3 days. While she was at ER, went to restroom around 11pm and came back with sudden onset left hip pain and stuttering speech. Patient told myself and ED physician that she has stuttering speech whenever she has pain and later said her last stroke presented with stuttering speech. Patient also reports mild residual left sided weakness after her stroke. Baseline uses a walker to ambulate, otherwise independent.     Past Medical History:      Stroke [place] PMH:  DM, HTN, HLD, CVA  Medications:  No Anticoagulant use  Antiplatelet use: Yes aspirin 350mg  Reviewed EMR for current medications  Allergies:  Reviewed Description: prednisone, hydralazine, doxycline, amlodipine, metoprolol  Social History: Smoking: No Alcohol Use: No  Family History:  There is no family history of premature cerebrovascular disease pertinent  to this consultation  ROS : 14 Points Review of Systems was performed and was negative except mentioned in HPI.  Past Surgical History: There Is No Surgical History  Contributory To Today's Visit    Examination: BP(183/71), Pulse(67), Blood Glucose(69) 1A: Level of Consciousness - Alert; keenly responsive + 0 1B: Ask Month and Age - Both Questions Right + 0 1C: Blink Eyes & Squeeze Hands - Performs Both Tasks + 0 2: Test Horizontal Extraocular Movements - Normal + 0 3: Test Visual Fields - No Visual Loss + 0 4: Test Facial Palsy (Use Grimace if Obtunded) - Normal symmetry + 0 5A: Test Left Arm Motor Drift - No Drift for 10 Seconds + 0 5B: Test Right Arm Motor Drift - No Drift for 10 Seconds + 0 6A: Test Left Leg Motor Drift - No Drift for 5 Seconds + 0 6B: Test Right Leg Motor Drift - No Drift for 5 Seconds + 0 7: Test Limb Ataxia (FNF/Heel-Shin) - No Ataxia + 0 8: Test Sensation - Normal; No sensory loss + 0 9: Test Language/Aphasia - Normal; No aphasia + 0 10: Test Dysarthria - Normal + 0 11: Test Extinction/Inattention - No abnormality + 0  NIHSS Score: 0  NIHSS Free Text : Stuttering speech, Left hip and back pain with LUE antigravity motion, no drift.   Pre-Morbid Modified Rankin Scale: 2 Points = Slight disability; unable to carry out all previous activities, but able to look after own affairs without assistance  Spoke with : Dr Read Drivers  Patient/Family was informed the Neurology Consult would occur via TeleHealth consult by way of interactive audio and video telecommunications and consented to receiving care in this manner.   Patient is being evaluated for possible acute neurologic impairment and high probability of imminent or life-threatening deterioration. I spent total of 34 minutes providing care to this patient, including time for face to face visit via telemedicine, review of medical records, imaging studies and discussion of findings with providers, the patient and/or family.   Dr Larkin Ina   [facility] For Inpatient follow-up with [facility] physician please call [facility] [phone]. This is not an outpatient  service. Post hospital discharge, please contact hospital directly.

## 2022-02-15 NOTE — ED Notes (Signed)
ED TO INPATIENT HANDOFF REPORT  ED Nurse Name and Phone #:  Angelina Pih RN  S Name/Age/Gender Krystal Vargas 66 y.o. female Room/Bed: MH02/MH02  Code Status   Code Status: Prior  Home/SNF/Other Home Patient oriented to: self, place, time, and situation Is this baseline? Yes   Triage Complete: Triage complete  Chief Complaint Dysarthria [R47.1]  Triage Note States last 3 nights has been waking up sweaty and feels like " my body is restless"  States HA, denies chest pain, reports SOB  Pt hypertensive at triage, H/O [place]  states was taken off of hydralazine, still taking losartan, hctz, took today  NAD noted at this time, A&O x 4   Allergies Allergies  Allergen Reactions   Amlodipine     Other reaction(s): Other (See Comments) sob sob sob sob    Metoprolol Other (See Comments)    sob sob sob sob    Doxycycline     Chest Pain    Prednisone Other (See Comments)    High blood sugar    Level of Care/Admitting Diagnosis ED Disposition     ED Disposition  Admit   Condition  --   Comment  Hospital Area: [facility] [[id number]]  Level of Care: Telemetry Medical [104]  Interfacility transfer: Yes  May place patient in observation at [facility] or [facility] if equivalent level of care is available:: No  Covid Evaluation: Asymptomatic - no recent exposure (last 10 days) testing not required  Diagnosis: Dysarthria [784.51.ICD-9-CM]  Admitting Physician: Vianne Bulls [[id number]]  Attending Physician: Vianne Bulls [[id number]]          B Medical/Surgery History Past Medical History:  Diagnosis Date   Asthma    Cancer ([place])    Diabetes mellitus    Fibromyalgia    High cholesterol    Hypertension    Stroke [facility])    Past Surgical History:  Procedure Laterality Date   TUBAL LIGATION       A IV Location/Drains/Wounds Patient Lines/Drains/Airways Status     Active Line/Drains/Airways     Name Placement date Placement time Site  Days   Peripheral IV 02/14/22 20 G 1" Right Antecubital 02/14/22  2340  Antecubital  1            Intake/Output Last 24 hours No intake or output data in the 24 hours ending 02/15/22 1437  Labs/Imaging Results for orders placed or performed during the hospital encounter of 02/14/22 (from the past 48 hour(s))  Basic metabolic panel     Status: Abnormal   Collection Time: 02/14/22  8:04 PM  Result Value Ref Range   Sodium 137 135 - 145 mmol/L   Potassium 3.4 (L) 3.5 - 5.1 mmol/L   Chloride 101 98 - 111 mmol/L   CO2 30 22 - 32 mmol/L   Glucose, Bld 118 (H) 70 - 99 mg/dL    Comment: Glucose reference range applies only to samples taken after fasting for at least 8 hours.   BUN 20 8 - 23 mg/dL   Creatinine, Ser 0.82 0.44 - 1.00 mg/dL   Calcium 9.0 8.9 - 10.3 mg/dL   GFR, Estimated >60 >60 mL/min    Comment: (NOTE) Calculated using the CKD-EPI Creatinine Equation (2021)    Anion gap 6 5 - 15    Comment: Performed at [facility], [address], [place], [place] [address]  CBC     Status: Abnormal   Collection Time: 02/14/22  8:04  PM  Result Value Ref Range   WBC 5.1 4.0 - 10.5 K/uL   RBC 3.69 (L) 3.87 - 5.11 MIL/uL   Hemoglobin 11.4 (L) 12.0 - 15.0 g/dL   HCT 34.8 (L) 36.0 - 46.0 %   MCV 94.3 80.0 - 100.0 fL   MCH 30.9 26.0 - 34.0 pg   MCHC 32.8 30.0 - 36.0 g/dL   RDW 15.3 11.5 - 15.5 %   Platelets 214 150 - 400 K/uL   nRBC 0.0 0.0 - 0.2 %    Comment: Performed at [facility], [address] [place], [place] [address]  Troponin I (High Sensitivity)     Status: None   Collection Time: 02/14/22  8:04 PM  Result Value Ref Range   Troponin I (High Sensitivity) 4 <18 ng/L    Comment: (NOTE) Elevated high sensitivity troponin I (hsTnI) values and significant  changes across serial measurements may suggest ACS but many other  chronic and acute conditions are known to elevate hsTnI results.  Refer to the "Links" section for chest pain algorithms and  additional  guidance. Performed at [facility], [address] [place], [place] [address]   Urinalysis, Routine w reflex microscopic     Status: Abnormal   Collection Time: 02/14/22 11:21 PM  Result Value Ref Range   Color, Urine YELLOW YELLOW   APPearance CLEAR CLEAR   Specific Gravity, Urine 1.020 1.005 - 1.030   pH 5.5 5.0 - 8.0   Glucose, UA NEGATIVE NEGATIVE mg/dL   Hgb urine dipstick SMALL (A) NEGATIVE   Bilirubin Urine NEGATIVE NEGATIVE   Ketones, ur NEGATIVE NEGATIVE mg/dL   Protein, ur NEGATIVE NEGATIVE mg/dL   Nitrite NEGATIVE NEGATIVE   Leukocytes,Ua NEGATIVE NEGATIVE    Comment: Performed at [facility], [address] [place], [place] [address]  Urinalysis, Microscopic (reflex)     Status: Abnormal   Collection Time: 02/14/22 11:21 PM  Result Value Ref Range   RBC / HPF 0-5 0 - 5 RBC/hpf   WBC, UA 0-5 0 - 5 WBC/hpf   Bacteria, UA RARE (A) NONE SEEN   Squamous Epithelial / LPF 0-5 0 - 5    Comment: Performed at [facility], [address] [place], [place] [address]  CBG monitoring, ED     Status: Abnormal   Collection Time: 02/14/22 11:31 PM  Result Value Ref Range   Glucose-Capillary 69 (L) 70 - 99 mg/dL    Comment: Glucose reference range applies only to samples taken after fasting for at least 8 hours.  Troponin I (High Sensitivity)     Status: None   Collection Time: 02/14/22 11:59 PM  Result Value Ref Range   Troponin I (High Sensitivity) 6 <18 ng/L    Comment: (NOTE) Elevated high sensitivity troponin I (hsTnI) values and significant  changes across serial measurements may suggest ACS but many other  chronic and acute conditions are known to elevate hsTnI results.  Refer to the "Links" section for chest pain algorithms and additional  guidance. Performed at [facility], [address] [place], [place] [address]   CBG monitoring, ED     Status: Abnormal   Collection Time: 02/15/22  1:38 PM  Result  Value Ref Range   Glucose-Capillary 146 (H) 70 - 99 mg/dL    Comment: Glucose reference range applies only to samples taken after fasting for at least 8 hours.   CT HEAD  CODE STROKE WO CONTRAST`  Result Date: 02/14/2022 CLINICAL DATA:  Code stroke. Initial evaluation for acute neuro deficit, stroke suspected, aphasia. EXAM: CT HEAD WITHOUT CONTRAST TECHNIQUE: Contiguous axial images were obtained from the base of the skull through the vertex without intravenous contrast. RADIATION DOSE REDUCTION: This exam was performed according to the departmental dose-optimization program which includes automated exposure control, adjustment of the mA and/or kV according to patient size and/or use of iterative reconstruction technique. COMPARISON:  Prior study from 09/16/2019. FINDINGS: Brain: Cerebral volume within normal limits. Chronic microvascular ischemic disease with a few scattered remote lacunar infarcts noted, similar to prior. No acute intracranial hemorrhage. No acute large vessel territory infarct. No mass lesion or midline shift. No hydrocephalus or extra-axial fluid collection. Vascular: No hyperdense vessel. Calcified atherosclerosis present at the skull base. Skull: Scalp soft tissues and calvarium within normal limits. Sinuses/Orbits: Globes and orbital soft tissues within normal limits. Paranasal sinuses are clear. No mastoid effusion. Other: None. ASPECTS [facility] Stroke Program Early CT Score) - Ganglionic level infarction (caudate, lentiform nuclei, internal capsule, insula, M1-M3 cortex): 7 - Supraganglionic infarction (M4-M6 cortex): 3 Total score (0-10 with 10 being normal): 10 IMPRESSION: 1. No acute intracranial abnormality. 2. ASPECTS is 10. 3. Moderately advanced chronic microvascular ischemic disease, similar to prior. Critical Value/emergent results were called by telephone at the time of interpretation on 02/14/2022 at 11:43 pm to provider [facility] , who verbally acknowledged these results.  Electronically Signed   By: Jeannine Boga M.D.   On: 02/14/2022 23:45   DG Chest 2 View  Result Date: 02/14/2022 CLINICAL DATA:  States last 3 nights has been waking up sweaty and feels like " my body is restless" States HA, denies chest pain, reports SOB Pt hypertensive at triage, H/O [place] states was taken off of hydralazine, still taking losartan EXAM: CHEST - 2 VIEW COMPARISON:  11/16/2021 FINDINGS: Lungs are clear. Heart size upper limits normal for technique. No effusion. Visualized bones unremarkable. IMPRESSION: No acute cardiopulmonary disease. Electronically Signed   By: Lucrezia Europe M.D.   On: 02/14/2022 20:16    Pending Labs Unresulted Labs (From admission, onward)    None       Vitals/Pain Today's Vitals   02/15/22 0815 02/15/22 0930 02/15/22 1338 02/15/22 1430  BP: (!) 163/64 (!) 183/76 (!) 165/62 (!) 125/93  Pulse: (!) 43 (!) 44 (!) 51 (!) 50  Resp: '15 15 19 18  '$ Temp: 98.3 F (36.8 C) 98.3 F (36.8 C) 98.3 F (36.8 C) 98.3 F (36.8 C)  TempSrc:      SpO2: 95% 99% 100% 98%  Weight:      Height:      PainSc:        Isolation Precautions No active isolations  Medications Medications  aspirin EC tablet 325 mg (325 mg Oral Given 02/15/22 1142)  atorvastatin (LIPITOR) tablet 80 mg (has no administration in time range)  glipiZIDE (GLUCOTROL XL) 24 hr tablet 5 mg (has no administration in time range)  hydrochlorothiazide (HYDRODIURIL) tablet 25 mg (25 mg Oral Given 02/15/22 1143)  metFORMIN (GLUCOPHAGE-XR) 24 hr tablet 1,000 mg (has no administration in time range)  pantoprazole (PROTONIX) EC tablet 40 mg (40 mg Oral Given 02/15/22 1142)  dextrose 50 % solution 50 mL (50 mLs Intravenous Given 02/14/22 2341)    Mobility walks Low fall risk   Focused Assessments Cardiac Assessment Handoff:    Lab Results  Component Value Date   CKTOTAL 185 02/08/2017   TROPONINI <0.03  06/10/2018   Lab Results  Component Value Date   DDIMER 0.70 (H) 06/09/2018    Does the Patient currently have chest pain? No    R Recommendations: See Admitting Provider Note  Report given to:   Additional Notes:   Alert and noted to have stutter and left side decrease sensationPt did not receive TPA. NIHHS 2. Passed swallow study per documentation.. Sinus brady on monitor.

## 2022-02-15 NOTE — Inpatient Diabetes Management (Signed)
Inpatient Diabetes Program Recommendations  AACE/ADA: New Consensus Statement on Inpatient Glycemic Control (2015)  Target Ranges:  Prepandial:   less than 140 mg/dL      Peak postprandial:   less than 180 mg/dL (1-2 hours)      Critically ill patients:  140 - 180 mg/dL   Lab Results  Component Value Date   GLUCAP 69 (L) 02/14/2022   HGBA1C 7.1 (H) 09/16/2019    Review of Glycemic Control  Latest Reference Range & Units 02/14/22 23:31  Glucose-Capillary 70 - 99 mg/dL 69 (L)  (L): Data is abnormally low Diabetes history: Type 2 Dm Outpatient Diabetes medications: Glipizide 5 mg QD, Metformin 1000 mg BID Current orders for Inpatient glycemic control: none  Inpatient Diabetes Program Recommendations:    Noted hypoglycemia and associated intervention. Please use hypoglycemia protocol and recheck CBG within 15 mins of previous hypoglycemic episode.   Most recent A1c was 5.6% per care everywhere. Curious if hypoglycemia is more frequent and contributing to symptoms.   -Repeat A1C?  -Add CBGs TID & HS -Discontinue Glipizide at dc?    Thanks, Bronson Curb, MSN, RNC-OB Diabetes Coordinator [phone] (8a-5p)

## 2022-02-15 NOTE — ED Notes (Signed)
Report given to [facility] 

## 2022-02-15 NOTE — Care Plan (Signed)
Phone call and message sent to RN in [place] regarding pt. Coming to [place]. No response at this time, will continue to try and reach RN.

## 2022-02-15 NOTE — ED Notes (Signed)
Report given to floor nurse and [facility] in to transport

## 2022-02-15 NOTE — ED Notes (Signed)
Assumed care of pt at this time.

## 2022-02-15 NOTE — H&P (Signed)
History and Physical    Krystal Vargas ZOX:[id number] DOB: 1955/08/03 DOA: 02/14/2022  PCP: Heron Nay, PA (Confirm with patient/family/NH records and if not entered, this has to be entered at [facility] point of entry) Patient coming from: Home  I have personally briefly reviewed patient's old medical records in [facility]  Chief Complaint: I have been stuttering  HPI: Krystal Vargas is a 66 y.o. female with medical history significant of remote stroke with residual left-sided weakness, HTN, IIDM, mild intermittent asthma, presented with strokelike symptoms.  Patient has had labile blood pressures recently, PCP has adjusted last week.  Hydralazine discontinued in May.  Losartan also decreased sometime few weeks ago.  Monday, she started noticed her blood pressure elevated, SBP 180-200, which is new compared to her baseline 130s systolic.  She started to feel episode of lightheadedness, denies any fall.  Tuesday morning, patient woke up with first episode of stuttering speech, she denies any trouble understanding antibiotics talking but " tongue out-of-control" episode on and off.  She also has had 2 nights of significant palpitations and night sweats.  She also noticed her sugars been running low to 60s in the morning.  Yesterday morning, patient woke up with significant night sweats, then she developed started speech shaking and decided to come to the hospital.  She denied any new weakness numbness of any of the limbs, she does complain about blurry vision this week as well denies any double vision no headache.  Review of Systems: As per HPI otherwise 14 point review of systems negative.    Past Medical History:  Diagnosis Date   Asthma    Cancer (HCC)    Diabetes mellitus    Fibromyalgia    High cholesterol    Hypertension    Stroke [facility])     Past Surgical History:  Procedure Laterality Date   TUBAL LIGATION       reports that she has quit smoking. She has never used smokeless  tobacco. She reports current alcohol use. She reports that she does not use drugs.  Allergies  Allergen Reactions   Amlodipine     Other reaction(s): Other (See Comments) sob sob sob sob    Metoprolol Other (See Comments)    sob sob sob sob    Doxycycline     Chest Pain    Hydralazine Other (See Comments)    Pt states she does not function on this   Prednisone Other (See Comments)    High blood sugar    Family History  Problem Relation Age of Onset   Hypertension Mother    Diabetes Mellitus II Mother    Bronchitis Father    Hypertension Sister    Diabetes Mellitus II Sister    Hypertension Brother    Diabetes Mellitus II Brother      Prior to Admission medications   Medication Sig Start Date End Date Taking? Authorizing Provider  albuterol (PROVENTIL) (2.5 MG/3ML) 0.083% nebulizer solution 3 mLs (2.5 mg total) every 4 (four) hours as needed. 05/08/17  Yes [provider]  albuterol (VENTOLIN HFA) 108 (90 Base) MCG/ACT inhaler Inhale into the lungs. 10/01/21  Yes [provider]  albuterol (VENTOLIN HFA) 108 (90 Base) MCG/ACT inhaler USE 2 INHALATIONS BY MOUTH EVERY 6 HOURS AS NEEDED 10/01/21  Yes [provider]  atorvastatin (LIPITOR) 80 MG tablet Take 1 tablet (80 mg total) by mouth daily at 6 PM. Patient taking differently: Take 80 mg by mouth every evening. 02/05/19  Yes  Marlin Canary U, DO  atorvastatin (LIPITOR) 80 MG tablet Take 80 mg by mouth daily.   Yes [provider]  atorvastatin (LIPITOR) 80 MG tablet Take 1 tablet by mouth daily. 08/24/21  Yes [provider]  carvedilol (COREG) 25 MG tablet Take 12.5 mg by mouth See admin instructions. Take half tablet (12.5 mg) by mouth two times a day with meals. 03/28/21  Yes [provider]  carvedilol (COREG) 25 MG tablet Take 12.5 mg by mouth 2 (two) times daily with a meal. 09/15/21  Yes [provider]  cyclobenzaprine (FLEXERIL) 10 MG tablet Take by  mouth.   Yes [provider]  fluticasone-salmeterol (ADVAIR) 250-50 MCG/ACT AEPB Inhale 1 puff into the lungs as needed (for wheezing and shortness of breath). 09/22/21  Yes [provider]  fluticasone-salmeterol (ADVAIR) 250-50 MCG/ACT AEPB Inhale 1 puff into the lungs as needed (for wheezing and shortness of breath).   Yes [provider]  fluticasone-salmeterol (WIXELA INHUB) 250-50 MCG/ACT AEPB Inhale 1 puff into the lungs as needed (for wheezing and shortness of breath). 08/06/21  Yes [provider]  glucose blood (ONETOUCH VERIO) test strip USE AS DIRECTED TO CHECK  BLOOD SUGAR TWICE DAILY 06/29/21  Yes [provider]  hydrALAZINE (APRESOLINE) 100 MG tablet Take by mouth. 01/08/21  Yes [provider]  hydrALAZINE (APRESOLINE) 100 MG tablet Take 100 mg by mouth 3 (three) times daily. 09/28/21  Yes [provider]  hydrochlorothiazide (HYDRODIURIL) 25 MG tablet Take 25 mg by mouth daily. 08/17/19  Yes [provider]  isosorbide mononitrate (IMDUR) 30 MG 24 hr tablet Take 30 mg by mouth daily. 05/11/19  Yes [provider]  losartan (COZAAR) 100 MG tablet Take 1 tablet by mouth at bedtime. 04/27/21  Yes [provider]  losartan (COZAAR) 100 MG tablet Take 100 mg by mouth daily. 02/12/22  Yes [provider]  losartan (COZAAR) 50 MG tablet Take by mouth. 03/22/21 03/22/22 Yes [provider]  metFORMIN (GLUCOPHAGE) 500 MG tablet Take 500 mg by mouth 2 (two) times daily. 02/12/22  Yes [provider]  metFORMIN (GLUCOPHAGE) 500 MG tablet Take 500 mg by mouth 2 (two) times daily. 11/12/21  Yes [provider]  metFORMIN (GLUCOPHAGE-XR) 500 MG 24 hr tablet Take 500 mg by mouth 2 (two) times daily with a meal.   Yes [provider]  metFORMIN (GLUCOPHAGE-XR) 500 MG 24 hr tablet Take 500 mg by mouth 2 (two) times daily with a meal. 05/09/21  Yes [provider]   metFORMIN (GLUMETZA) 500 MG (MOD) 24 hr tablet Take 2 tablets (1,000 mg total) by mouth 2 (two) times daily with a meal. Patient taking differently: Take 500 mg by mouth 2 (two) times daily with a meal. 02/07/19  Yes Vann, Selinda Orion, DO  Multiple Vitamin (QUINTABS) TABS Take 1 tablet by mouth daily.   Yes [provider]  Multiple Vitamins-Minerals (MULTIPLE VITAMINS/WOMENS) tablet Take 1 tablet by mouth daily.   Yes [provider]  nitroGLYCERIN (NITROSTAT) 0.4 MG SL tablet Place 1 tablet (0.4 mg total) under the tongue every 5 (five) minutes as needed for chest pain. 06/10/18  Yes Smith, Loreta Ave, MD  Omega-3 Fatty Acids (FISH OIL) 1000 MG CAPS Take 1,000 mg by mouth daily.   Yes [provider]  omeprazole (PRILOSEC) 40 MG capsule Take 40 mg by mouth daily.  08/10/19  Yes [provider]  Polyethyl Glycol-Propyl Glycol (SYSTANE OP) Place 1 drop into  both eyes daily.   Yes [provider]  sodium chloride (OCEAN) 0.65 % SOLN nasal spray Place 1 spray into both nostrils as needed for congestion.   Yes [provider]  atorvastatin (LIPITOR) 40 MG tablet Take 1 tablet by mouth daily. Patient not taking: Reported on 02/15/2022    [provider]  Biotin 5 MG TBDP Take 1 tablet by mouth daily. Patient not taking: Reported on 02/15/2022    [provider]  glipiZIDE (GLUCOTROL XL) 5 MG 24 hr tablet Take 5 mg by mouth daily with breakfast. Patient not taking: Reported on 02/15/2022    [provider]  [facility] VERIO test strip 1 each 2 (two) times daily. 09/15/21   [provider]    Physical Exam: Vitals:   02/15/22 1608 02/15/22 1619 02/15/22 1630 02/15/22 1700  BP:  (!) 196/76 (!) 184/69   Pulse:  (!) 47 (!) 45   Resp:  17 20 19   Temp:  98.6 F (37 C)    TempSrc:  Oral    SpO2:  100%    Weight: 69.8 kg     Height:        Constitutional: NAD, calm, comfortable Vitals:   02/15/22 1608 02/15/22 1619  02/15/22 1630 02/15/22 1700  BP:  (!) 196/76 (!) 184/69   Pulse:  (!) 47 (!) 45   Resp:  17 20 19   Temp:  98.6 F (37 C)    TempSrc:  Oral    SpO2:  100%    Weight: 69.8 kg     Height:       Eyes: PERRL, lids and conjunctivae normal ENMT: Mucous membranes are moist. Posterior pharynx clear of any exudate or lesions.Normal dentition.  Neck: normal, supple, no masses, no thyromegaly Respiratory: clear to auscultation bilaterally, no wheezing, no crackles. Normal respiratory effort. No accessory muscle use.  Cardiovascular: Regular rate and rhythm, no murmurs / rubs / gallops. No extremity edema. 2+ pedal pulses. No carotid bruits.  Abdomen: no tenderness, no masses palpated. No hepatosplenomegaly. Bowel sounds positive.  Musculoskeletal: no clubbing / cyanosis. No joint deformity upper and lower extremities. Good ROM, no contractures. Normal muscle tone.  Skin: no rashes, lesions, ulcers. No induration Neurologic: CN 2-12 grossly intact. Sensation intact, DTR normal. Strength 5/5 in all 4.  Mild weakness on left side which is chronic, speech is stuttered Psychiatric: Normal judgment and insight. Alert and oriented x 3. Normal mood.     Labs on Admission: I have personally reviewed following labs and imaging studies  CBC: Recent Labs  Lab 02/14/22 2004  WBC 5.1  HGB 11.4*  HCT 34.8*  MCV 94.3  PLT 214   Basic Metabolic Panel: Recent Labs  Lab 02/14/22 2004  NA 137  K 3.4*  CL 101  CO2 30  GLUCOSE 118*  BUN 20  CREATININE 0.82  CALCIUM 9.0   GFR: Estimated Creatinine Clearance: 61 mL/min (by C-G formula based on SCr of 0.82 mg/dL). Liver Function Tests: No results for input(s): "AST", "ALT", "ALKPHOS", "BILITOT", "PROT", "ALBUMIN" in the last 168 hours. No results for input(s): "LIPASE", "AMYLASE" in the last 168 hours. No results for input(s): "AMMONIA" in the last 168 hours. Coagulation Profile: No results for input(s): "INR", "PROTIME" in the last 168  hours. Cardiac Enzymes: No results for input(s): "CKTOTAL", "CKMB", "CKMBINDEX", "TROPONINI" in the last 168 hours. BNP (last 3 results) No results for input(s): "PROBNP" in the last 8760 hours. HbA1C: No results for input(s): "HGBA1C" in the  last 72 hours. CBG: Recent Labs  Lab 02/14/22 2331 02/15/22 1338  GLUCAP 69* 146*   Lipid Profile: No results for input(s): "CHOL", "HDL", "LDLCALC", "TRIG", "CHOLHDL", "LDLDIRECT" in the last 72 hours. Thyroid Function Tests: No results for input(s): "TSH", "T4TOTAL", "FREET4", "T3FREE", "THYROIDAB" in the last 72 hours. Anemia Panel: No results for input(s): "VITAMINB12", "FOLATE", "FERRITIN", "TIBC", "IRON", "RETICCTPCT" in the last 72 hours. Urine analysis:    Component Value Date/Time   COLORURINE YELLOW 02/14/2022 2321   APPEARANCEUR CLEAR 02/14/2022 2321   LABSPEC 1.020 02/14/2022 2321   PHURINE 5.5 02/14/2022 2321   GLUCOSEU NEGATIVE 02/14/2022 2321   HGBUR SMALL (A) 02/14/2022 2321   BILIRUBINUR NEGATIVE 02/14/2022 2321   KETONESUR NEGATIVE 02/14/2022 2321   PROTEINUR NEGATIVE 02/14/2022 2321   NITRITE NEGATIVE 02/14/2022 2321   LEUKOCYTESUR NEGATIVE 02/14/2022 2321    Radiological Exams on Admission: CT HEAD CODE STROKE WO CONTRAST`  Result Date: 02/14/2022 CLINICAL DATA:  Code stroke. Initial evaluation for acute neuro deficit, stroke suspected, aphasia. EXAM: CT HEAD WITHOUT CONTRAST TECHNIQUE: Contiguous axial images were obtained from the base of the skull through the vertex without intravenous contrast. RADIATION DOSE REDUCTION: This exam was performed according to the departmental dose-optimization program which includes automated exposure control, adjustment of the mA and/or kV according to patient size and/or use of iterative reconstruction technique. COMPARISON:  Prior study from 09/16/2019. FINDINGS: Brain: Cerebral volume within normal limits. Chronic microvascular ischemic disease with a few scattered remote lacunar  infarcts noted, similar to prior. No acute intracranial hemorrhage. No acute large vessel territory infarct. No mass lesion or midline shift. No hydrocephalus or extra-axial fluid collection. Vascular: No hyperdense vessel. Calcified atherosclerosis present at the skull base. Skull: Scalp soft tissues and calvarium within normal limits. Sinuses/Orbits: Globes and orbital soft tissues within normal limits. Paranasal sinuses are clear. No mastoid effusion. Other: None. ASPECTS [facility] Stroke Program Early CT Score) - Ganglionic level infarction (caudate, lentiform nuclei, internal capsule, insula, M1-M3 cortex): 7 - Supraganglionic infarction (M4-M6 cortex): 3 Total score (0-10 with 10 being normal): 10 IMPRESSION: 1. No acute intracranial abnormality. 2. ASPECTS is 10. 3. Moderately advanced chronic microvascular ischemic disease, similar to prior. Critical Value/emergent results were called by telephone at the time of interpretation on 02/14/2022 at 11:43 pm to provider [facility] , who verbally acknowledged these results. Electronically Signed   By: Rise Mu M.D.   On: 02/14/2022 23:45   DG Chest 2 View  Result Date: 02/14/2022 CLINICAL DATA:  States last 3 nights has been waking up sweaty and feels like " my body is restless" States HA, denies chest pain, reports SOB Pt hypertensive at triage, H/O Stoke states was taken off of hydralazine, still taking losartan EXAM: CHEST - 2 VIEW COMPARISON:  11/16/2021 FINDINGS: Lungs are clear. Heart size upper limits normal for technique. No effusion. Visualized bones unremarkable. IMPRESSION: No acute cardiopulmonary disease. Electronically Signed   By: Corlis Leak M.D.   On: 02/14/2022 20:16    EKG: Independently reviewed.  Sinus, no acute ST changes.  Assessment/Plan Principal Problem:   Dysarthria Active Problems:   Stroke [facility])  (please populate well all problems here in Problem List. (For example, if patient is on BP meds at home and you resume  or decide to hold them, it is a problem that needs to be her. Same for CAD, COPD, HLD and so on)  Acute dysarthria -Suspect stroke -MRI brain without contrast, CT angiogram head and neck -Permissive hypertension x1 day  with PRN enalaprilat IV -Aspirin statin  Near syncope -Likely multiple factorial, patient was found to be cardia heart rate down to over 40s, and her glucose was low on presentation.  And her A1c reading since March has been 5.8> 5.6 implying over stringent glucose control. -Discontinue sulfonylurea and metformin, sliding scale for now -Discontinue Coreg left heart rate recovered  IIDM with hypoglycemia -As above.  HTN, uncontrolled -Reviewed her current blood pressure medication regimen, including losartan (on hold for permissive hypertension), Coreg, will be discontinued due to persistent bradycardia. -She has history of poor tolerance of hydralazine in the past, and she tells me that she takes imdur. -I propose amlodipine, plus ACEI regimen starting tomorrow.   DVT prophylaxis: Lovenox Code Status: Full code Family Communication: None at bedside Disposition Plan: Expect less than 2 midnight hospital stay Consults called: Neurology Admission status: Telemetry observation   Emeline General MD [facility] Pager [phone] 02/15/2022, 6:20 PM

## 2022-02-16 ENCOUNTER — Observation Stay (HOSPITAL_BASED_OUTPATIENT_CLINIC_OR_DEPARTMENT_OTHER): Payer: Medicare Other

## 2022-02-16 DIAGNOSIS — E162 Hypoglycemia, unspecified: Secondary | ICD-10-CM | POA: Diagnosis not present

## 2022-02-16 DIAGNOSIS — R471 Dysarthria and anarthria: Secondary | ICD-10-CM | POA: Diagnosis not present

## 2022-02-16 DIAGNOSIS — I6389 Other cerebral infarction: Secondary | ICD-10-CM

## 2022-02-16 DIAGNOSIS — E119 Type 2 diabetes mellitus without complications: Secondary | ICD-10-CM | POA: Diagnosis not present

## 2022-02-16 DIAGNOSIS — R479 Unspecified speech disturbances: Secondary | ICD-10-CM

## 2022-02-16 LAB — COMPREHENSIVE METABOLIC PANEL
ALT: 16 U/L (ref 0–44)
AST: 28 U/L (ref 15–41)
Albumin: 3.6 g/dL (ref 3.5–5.0)
Alkaline Phosphatase: 54 U/L (ref 38–126)
Anion gap: 9 (ref 5–15)
BUN: 13 mg/dL (ref 8–23)
CO2: 26 mmol/L (ref 22–32)
Calcium: 9.3 mg/dL (ref 8.9–10.3)
Chloride: 105 mmol/L (ref 98–111)
Creatinine, Ser: 0.84 mg/dL (ref 0.44–1.00)
GFR, Estimated: >60 mL/min (ref >60)
Glucose, Bld: 81 mg/dL (ref 70–99)
Potassium: 3.1 mmol/L — ABNORMAL LOW (ref 3.5–5.1)
Sodium: 140 mmol/L (ref 135–145)
Total Bilirubin: 1 mg/dL (ref 0.3–1.2)
Total Protein: 6.4 g/dL — ABNORMAL LOW (ref 6.5–8.1)

## 2022-02-16 LAB — CBC
HCT: 36.6 % (ref 36.0–46.0)
Hemoglobin: 12.2 g/dL (ref 12.0–15.0)
MCH: 30.7 pg (ref 26.0–34.0)
MCHC: 33.3 g/dL (ref 30.0–36.0)
MCV: 92 fL (ref 80.0–100.0)
Platelets: 197 10*3/uL (ref 150–400)
RBC: 3.98 MIL/uL (ref 3.87–5.11)
RDW: 15 % (ref 11.5–15.5)
WBC: 4.4 10*3/uL (ref 4.0–10.5)
nRBC: 0 % (ref 0.0–0.2)

## 2022-02-16 LAB — ECHOCARDIOGRAM COMPLETE
Area-P 1/2: 3.06 cm2
Height: 62 in
S' Lateral: 2.4 cm
Weight: 2462.1 oz

## 2022-02-16 LAB — LIPID PANEL
Cholesterol: 118 mg/dL (ref 0–200)
HDL: 58 mg/dL (ref >40)
LDL Cholesterol: 55 mg/dL (ref 0–99)
Total CHOL/HDL Ratio: 2 RATIO
Triglycerides: 27 mg/dL (ref <150)
VLDL: 5 mg/dL (ref 0–40)

## 2022-02-16 LAB — HEMOGLOBIN A1C
Hgb A1c MFr Bld: 6.1 % — ABNORMAL HIGH (ref 4.8–5.6)
Mean Plasma Glucose: 128.37 mg/dL

## 2022-02-16 LAB — GLUCOSE, CAPILLARY
Glucose-Capillary: 80 mg/dL (ref 70–99)
Glucose-Capillary: 99 mg/dL (ref 70–99)
Glucose-Capillary: 101 mg/dL — ABNORMAL HIGH (ref 70–99)

## 2022-02-16 LAB — TSH: TSH: 1.237 u[IU]/mL (ref 0.350–4.500)

## 2022-02-16 LAB — HIV ANTIBODY (ROUTINE TESTING W REFLEX): HIV Screen 4th Generation wRfx: NONREACTIVE

## 2022-02-16 MED ORDER — POTASSIUM CHLORIDE CRYS ER 20 MEQ PO TBCR
40.0000 meq | EXTENDED_RELEASE_TABLET | Freq: Once | ORAL | Status: AC
  Administered 2022-02-16: 40 meq via ORAL
  Filled 2022-02-16: qty 2

## 2022-02-16 MED ORDER — ATORVASTATIN CALCIUM 80 MG PO TABS: 80.0000 mg | ORAL_TABLET | Freq: Every evening | ORAL | 3 refills | Status: AC

## 2022-02-16 MED ORDER — ASPIRIN 325 MG PO TBEC: 325.0000 mg | DELAYED_RELEASE_TABLET | Freq: Every day | ORAL | 3 refills | Status: AC

## 2022-02-16 NOTE — Evaluation (Signed)
Occupational Therapy Evaluation Patient Details Name: Krystal Vargas MRN: [id number] DOB: 1956-03-05 Today's Date: 02/16/2022   History of Present Illness Pt is a 66 y/o female admitted for L weakness and speech difficulty. MRI negative. PMH includes CVA with L weakness, HTN, DM, and fibromyalgia.   Clinical Impression   Pt independent at baseline with ADLs and functional mobility, lives with spouse and assists with his caregiving as he has dementia. Pt currently needing set up - supervision for ADLs, supervision for bed mobility, and min guard for transfers with RW. Pt presenting with impairments listed below, will follow acutely. Recommend HHOT at d/c.      Recommendations for follow up therapy are one component of a multi-disciplinary discharge planning process, led by the attending physician.  Recommendations may be updated based on patient status, additional functional criteria and insurance authorization.   Follow Up Recommendations  Home health OT    Assistance Recommended at Discharge Intermittent Supervision/Assistance  Patient can return home with the following A little help with walking and/or transfers;A little help with bathing/dressing/bathroom;Assistance with cooking/housework;Help with stairs or ramp for entrance;Assist for transportation    Functional Status Assessment  Patient has had a recent decline in their functional status and demonstrates the ability to make significant improvements in function in a reasonable and predictable amount of time.  Equipment Recommendations  BSC/3in1    Recommendations for Other Services       Precautions / Restrictions Precautions Precautions: Fall Restrictions Weight Bearing Restrictions: No      Mobility Bed Mobility Overal bed mobility: Needs Assistance Bed Mobility: Supine to Sit, Sit to Supine     Supine to sit: Supervision          Transfers Overall transfer level: Needs assistance Equipment used: Rolling walker  (2 wheels) Transfers: Sit to/from Stand Sit to Stand: Min guard           General transfer comment: min guard fading to supervision with RW      Balance Overall balance assessment: Needs assistance Sitting-balance support: No upper extremity supported, Feet supported Sitting balance-Leahy Scale: Good     Standing balance support: No upper extremity supported, Bilateral upper extremity supported Standing balance-Leahy Scale: Fair Standing balance comment: Able to maintain static standing without UE support                           ADL either performed or assessed with clinical judgement   ADL Overall ADL's : Needs assistance/impaired Eating/Feeding: Set up   Grooming: Set up   Upper Body Bathing: Supervision/ safety   Lower Body Bathing: Supervison/ safety   Upper Body Dressing : Supervision/safety   Lower Body Dressing: Supervision/safety   Toilet Transfer: Supervision/safety   Toileting- Clothing Manipulation and Hygiene: Supervision/safety       Functional mobility during ADLs: Supervision/safety       Vision Baseline Vision/History: 1 Wears glasses Vision Assessment?: No apparent visual deficits     Perception     Praxis      Pertinent Vitals/Pain Pain Assessment Pain Assessment: Faces Pain Score: 4  Faces Pain Scale: Hurts little more Pain Location: LLE Pain Descriptors / Indicators: Grimacing, Guarding Pain Intervention(s): Limited activity within patient's tolerance, Monitored during session, Repositioned     Hand Dominance Right   Extremity/Trunk Assessment Upper Extremity Assessment Upper Extremity Assessment: Overall WFL for tasks assessed   Lower Extremity Assessment Lower Extremity Assessment: Defer to PT evaluation LLE Deficits / Details: Reporting pain  in L groin throughout L hip. Also reports it feels weaker than normal   Cervical / Trunk Assessment Cervical / Trunk Assessment: Kyphotic   Communication  Communication Communication: No difficulties   Cognition Arousal/Alertness: Awake/alert Behavior During Therapy: WFL for tasks assessed/performed Overall Cognitive Status: No family/caregiver present to determine baseline cognitive functioning                                 General Comments: following commands appropriately, provides PLOF     General Comments  VSS on RA    Exercises     Shoulder Instructions      Home Living Family/patient expects to be discharged to:: Private residence Living Arrangements: Spouse/significant other Available Help at Discharge: Family;Available 24 hours/day Type of Home: Apartment Home Access: Elevator     Home Layout: One level     Bathroom Shower/Tub: Chief Strategy Officer: Standard     Home Equipment: Rollator (4 wheels)   Additional Comments: spouse has dementia, physically helps him with ADLs; uses cabs/bus to get to dr appts and grocery store      Prior Functioning/Environment Prior Level of Function : Independent/Modified Independent             Mobility Comments: USed rollator for longer distances. ADLs Comments: Reports independence; relies on cabs for transportation        OT Problem List: Decreased strength;Decreased range of motion;Decreased activity tolerance;Impaired balance (sitting and/or standing)      OT Treatment/Interventions: Self-care/ADL training;Therapeutic exercise;DME and/or AE instruction;Energy conservation;Therapeutic activities;Balance training;Patient/family education    OT Goals(Current goals can be found in the care plan section) Acute Rehab OT Goals Patient Stated Goal: none stated OT Goal Formulation: With patient Time For Goal Achievement: 03/02/22 Potential to Achieve Goals: Good ADL Goals Pt Will Perform Upper Body Dressing: Independently;sitting Pt Will Perform Lower Body Dressing: Independently;sit to/from stand;sitting/lateral leans Pt Will Transfer to  Toilet: Independently;ambulating;regular height toilet Pt Will Perform Tub/Shower Transfer: Tub transfer;Shower transfer;ambulating;shower seat;rolling walker  OT Frequency: Min 2X/week    Co-evaluation              AM-PAC OT "6 Clicks" Daily Activity     Outcome Measure Help from another person eating meals?: None Help from another person taking care of personal grooming?: None Help from another person toileting, which includes using toliet, bedpan, or urinal?: None Help from another person bathing (including washing, rinsing, drying)?: A Little Help from another person to put on and taking off regular upper body clothing?: None Help from another person to put on and taking off regular lower body clothing?: A Little 6 Click Score: 22   End of Session Equipment Utilized During Treatment: Rolling walker (2 wheels) Nurse Communication: Mobility status  Activity Tolerance: Patient tolerated treatment well Patient left: in bed;with call bell/phone within reach;with bed alarm set  OT Visit Diagnosis: Unsteadiness on feet (R26.81);Other abnormalities of gait and mobility (R26.89);Muscle weakness (generalized) (M62.81)                Time: 1308-6578 OT Time Calculation (min): 20 min Charges:  OT General Charges $OT Visit: 1 Visit OT Evaluation $OT Eval Moderate Complexity: 1 [address], OTD, OTR/L Acute Rehab [phone]  Mayer Masker 02/16/2022, 12:27 PM

## 2022-02-16 NOTE — Progress Notes (Addendum)
Neurology Progress Note   S:// Patient seen and examined Was evaluated by telestroke at [facility] for sudden onset of stuttering speech and left-sided weakness. Patient has a history of hypertension, hyperlipidemia, diabetes, prior right frontal subcortical infarct due to small vessel etiology with recrudescence of symptoms on a few occasions and no significant residual deficits, fibromyalgia, history of cervical cancer status post hysterectomy who presented to the outside ER with stuttering symptoms since Thursday.  NIH stroke scale was 0 on exam.  She was mildly hypoglycemic with sugar levels in the high 60s.  She had no evidence of aphasia but had stuttering in her speech and the telestroke specialist recommended further inpatient evaluation. She was transferred yesterday from the outlying ER.  I was called to follow-up this morning by Dr. Tana Coast. Patient reports improvement in her symptoms but says that she still has some stuttering of her speech.  She said that the left-sided symptoms were more stiffness and pain and other than weakness in the arm and leg with worse in the leg than on.  O:// Current vital signs: BP (!) 146/66 (BP Location: Left Arm)   Pulse (!) 49   Temp 97.7 F (36.5 C) (Oral)   Resp 17   Ht '5\' 2"'$  (1.575 m)   Wt 69.8 kg   LMP 05/16/2012   SpO2 98%   BMI 28.15 kg/m  Vital signs in last 24 hours: Temp:  [97.7 F (36.5 C)-98.6 F (37 C)] 97.7 F (36.5 C) (10/07 0902) Pulse Rate:  [42-51] 49 (10/07 0902) Resp:  [15-20] 17 (10/07 0902) BP: (125-196)/(55-93) 146/66 (10/07 0902) SpO2:  [92 %-100 %] 98 % (10/07 0902) Weight:  [69.8 kg] 69.8 kg (10/06 1608) General: Awake alert in no distress HEENT: Normocephalic atraumatic Lungs: Clear Cardiovascular: Regular rhythm Neurological exam Awake alert oriented x3 Her speech has a stuttering pattern to it No significant dysarthria other than stuttering No aphasia Cranial nerves II to XII intact Motor  examination with no drift but she has subtle left leg weakness with 4+/5 strength in comparison to the other side which is nearly full strength. Sensation: Diminished to the left side which she says has been somewhat new. No dysmetria NIH stroke scale-1 for sensory.  Medications  Current Facility-Administered Medications:     stroke: early stages of recovery book, , Does not apply, Once, Wynetta Fines T, MD   acetaminophen (TYLENOL) tablet 650 mg, 650 mg, Oral, Q4H PRN **OR** acetaminophen (TYLENOL) 160 MG/5ML solution 650 mg, 650 mg, Per Tube, Q4H PRN **OR** acetaminophen (TYLENOL) suppository 650 mg, 650 mg, Rectal, Q4H PRN, Wynetta Fines T, MD   aspirin EC tablet 325 mg, 325 mg, Oral, Daily, Tyrone Nine, Dan, DO, 325 mg at 02/16/22 0903   atorvastatin (LIPITOR) tablet 80 mg, 80 mg, Oral, q1800, Tyrone Nine, Dan, DO, 80 mg at 02/15/22 1714   enalaprilat (VASOTEC) injection 1.25 mg, 1.25 mg, Intravenous, Q6H PRN, Wynetta Fines T, MD   enoxaparin (LOVENOX) injection 40 mg, 40 mg, Subcutaneous, Q24H, Zhang, Ping T, MD, 40 mg at 02/15/22 1714   hydrochlorothiazide (HYDRODIURIL) tablet 25 mg, 25 mg, Oral, Daily, Tyrone Nine, Dan, DO, 25 mg at 02/16/22 0903   insulin aspart (novoLOG) injection 0-15 Units, 0-15 Units, Subcutaneous, TID WC, Wynetta Fines T, MD   metFORMIN (GLUCOPHAGE-XR) 24 hr tablet 1,000 mg, 1,000 mg, Oral, BID WC, Floyd, Dan, DO, 1,000 mg at 02/16/22 0902   pantoprazole (PROTONIX) EC tablet 40 mg, 40 mg, Oral, Daily, Tyrone Nine, Dan, DO, 40 mg at 02/16/22  1610   potassium chloride SA (KLOR-CON M) CR tablet 40 mEq, 40 mEq, Oral, Once, Rai, Ripudeep K, MD Labs CBC    Component Value Date/Time   WBC 4.4 02/16/2022 0733   RBC 3.98 02/16/2022 0733   HGB 12.2 02/16/2022 0733   HCT 36.6 02/16/2022 0733   PLT 197 02/16/2022 0733   MCV 92.0 02/16/2022 0733   MCH 30.7 02/16/2022 0733   MCHC 33.3 02/16/2022 0733   RDW 15.0 02/16/2022 0733   LYMPHSABS 2.4 09/15/2019 2101   MONOABS 0.6 09/15/2019 2101   EOSABS  0.2 09/15/2019 2101   BASOSABS 0.0 09/15/2019 2101    CMP     Component Value Date/Time   NA 140 02/16/2022 0733   K 3.1 (L) 02/16/2022 0733   CL 105 02/16/2022 0733   CO2 26 02/16/2022 0733   GLUCOSE 81 02/16/2022 0733   BUN 13 02/16/2022 0733   CREATININE 0.84 02/16/2022 0733   CALCIUM 9.3 02/16/2022 0733   PROT 6.4 (L) 02/16/2022 0733   ALBUMIN 3.6 02/16/2022 0733   AST 28 02/16/2022 0733   ALT 16 02/16/2022 0733   ALKPHOS 54 02/16/2022 0733   BILITOT 1.0 02/16/2022 0733   GFRNONAA >60 02/16/2022 0733   GFRAA >60 10/06/2019 1315    glycosylated hemoglobin-6.1  Lipid Panel     Component Value Date/Time   CHOL 118 02/16/2022 0300   TRIG 27 02/16/2022 0300   HDL 58 02/16/2022 0300   CHOLHDL 2.0 02/16/2022 0300   VLDL 5 02/16/2022 0300   LDLCALC 55 02/16/2022 0300     Imaging I have reviewed images in epic and the results pertinent to this consultation are: MRI of the brain with no acute changes.  No acute stroke.  Chronic microhemorrhages and chronic small vessel disease.  CT angiography head and neck negative for emergent LVO.  Short segment linear filling defect within the proximal left V4 segment with outpouching which is stable from previous exam-could reflect a focal plaque or possible short segment dissection.  Associated mild stenosis at this level is also unchanged.  Diffuse tortuosity of major arterial vasculature in the head and neck suggesting underlying chronic hypertension.  2D echo pending  Chest x-ray unremarkable Urinalysis with no evidence of UTI  Assessment:  66 year old with above past medical history presenting with 2 to 3-day history of not feeling well, stiffness and pain in the left lower extremity causing weakness and stuttering speech with current examination only consistent with mild sensory deficits on the left side.  Imaging negative for acute stroke on brain MRI but continues to show chronic microhemorrhages in chronic small vessel  disease. Has been kept on aspirin monotherapy due to multiple chronic microhemorrhages. I do not suspect that her current episode was a TIA.  Rather this was an episode of recrudescence of her old stroke symptoms.  Medical work-up for ruling out underlying infections etc. should be the mainstay of treatment. Somehow a full stroke work-up has already been ordered-her LDL is within goal. Her A1c is within goal Echo has been done and is pending but I do not anticipate new or actionable findings  Impression: Recrudescence of old stroke symptoms-likely in the setting of hypoglycemia on presentation  Recommendations: Continue home antiplatelets.  Aspirin 325.  Would avoid dual antiplatelets given significant chronic microhemorrhages on MRI. Continue home statin Blood pressure goal-normotension Outpatient neurology follow-up in 8 to 12 weeks. PT OT speech therapy Please feel free to reach out with questions as needed if the echocardiogram has  any concerning findings. Neurology will sign off for now.   -- Amie Portland, MD Neurologist [facility] Pager: [phone]

## 2022-02-16 NOTE — Evaluation (Signed)
Speech Language Pathology Evaluation Patient Details Name: Krystal Vargas MRN: [id number] DOB: 1956/04/20 Today's Date: 02/16/2022 Time: 1324-4010 SLP Time Calculation (min) (ACUTE ONLY): 15 min  Problem List:  Patient Active Problem List   Diagnosis Date Noted   Dysarthria 02/15/2022   Stroke (HCC) 02/15/2022   Acute CVA (cerebrovascular accident) (HCC) 09/16/2019   Stroke (cerebrum) (HCC) 09/16/2019   CVA (cerebral vascular accident) (HCC) 02/05/2019   Acute ischemic stroke (HCC) 02/04/2019   Obesity (BMI 30-39.9) 06/11/2018   Chest pain 06/09/2018   SOB (shortness of breath) 02/07/2017   Chest pressure 02/07/2017   Diabetes mellitus without complication (HCC) 02/07/2017   GERD (gastroesophageal reflux disease) 02/07/2017   Endometrial cancer (HCC) 02/07/2017   Hypokalemia 02/07/2017   Left arm pain 02/07/2017   Hypertension    Hyperlipidemia associated with type 2 diabetes mellitus (HCC)    Asthma    Past Medical History:  Past Medical History:  Diagnosis Date   Asthma    Cancer (HCC)    Diabetes mellitus    Fibromyalgia    High cholesterol    Hypertension    Stroke [facility])    Past Surgical History:  Past Surgical History:  Procedure Laterality Date   TUBAL LIGATION     HPI:  Pt is a 66 y/o female admitted for L weakness and speech difficulty. MRI negative. PMH includes CVA with L weakness, HTN, DM, and fibromyalgia. When admitted following CVA in 2021, patient evaluated by SLP and at that time was exhibiting fluency disorder (stuttering).   Assessment / Plan / Recommendation Clinical Impression  Patient presenting with a mild, at times mild-moderate fluency disorder as per this evaluation. Patient reported that when she had stroke back in 2021, her stuttering improved over a short period of time such that she did not require speech therapy following hospital discharge. Currently, she feels that the stuttering she has noticed is very similar to previous and that it is  starting to subside. SLP assessed patient's fluency during unstructured conversation and oral reading task. During reading task she exhibited hesitations, halting speech, two blocks that were over 7 seconds and secondary behavior of eye blinking during a stuttering event. She did have some difficulty with pronouncing some of the more difficult words but this may not be due to her fluency disorder as she told SLP that someone in her family was "helping me with my reading". She did not exhibit secondary behaviors or significant blocks during unstructured conversation with SLP. SLP is recommending OP SLP upon discharge and informed patient that if her stuttering has completely resolved, she could cancel the apopintment but that if she is still exhibiting/experiencing even a mild stutter, she should proceed with SLP evaluation and treatment as necessary.Marland Kitchen    SLP Assessment  SLP Recommendation/Assessment: All further Speech Lanaguage Pathology  needs can be addressed in the next venue of care SLP Visit Diagnosis: Dysarthria and anarthria (R47.1);Other (comment) (fluency disorder)    Recommendations for follow up therapy are one component of a multi-disciplinary discharge planning process, led by the attending physician.  Recommendations may be updated based on patient status, additional functional criteria and insurance authorization.    Follow Up Recommendations  Outpatient SLP    Assistance Recommended at Discharge  PRN  Functional Status Assessment Patient has had a recent decline in their functional status and demonstrates the ability to make significant improvements in function in a reasonable and predictable amount of time.  Frequency and Duration   N/a  SLP Evaluation Cognition  Overall Cognitive Status: No family/caregiver present to determine baseline cognitive functioning Orientation Level: Oriented X4       Comprehension  Auditory Comprehension Overall Auditory Comprehension:  Appears within functional limits for tasks assessed    Expression Expression Primary Mode of Expression: Verbal Verbal Expression Overall Verbal Expression: Appears within functional limits for tasks assessed Written Expression Dominant Hand: Right   Oral / Motor  Oral Motor/Sensory Function Overall Oral Motor/Sensory Function: Within functional limits Motor Speech Overall Motor Speech: Appears within functional limits for tasks assessed Respiration: Within functional limits Phonation: Normal Resonance: Within functional limits Articulation: Impaired Level of Impairment: Conversation Intelligibility: Intelligible Motor Speech Errors: Aware Interfering Components: Premorbid status Effective Techniques: Slow rate;Pause            Angela Nevin, MA, CCC-SLP Speech Therapy

## 2022-02-16 NOTE — Progress Notes (Signed)
TRH night cross cover note:   I was contacted by RN regarding this patient's persistent bradycardia throughout the hospital course with heart rates consistently in the 40s, transiently decreasing into the 30s in nonsustained fashion.  Patient without any acute complaints and blood pressure has been stable without any associated hypotension.  Given the persistence of patient's bradycardia throughout the hospitalization, the absence of acute complaints related to her bradycardia, and in the absence of hypotension, I have not placed any new orders at this time.      Babs Bertin, DO Hospitalist

## 2022-02-16 NOTE — Progress Notes (Signed)
OT Cancellation Note  Patient Details Name: Krystal Vargas MRN: [id number] DOB: [birth year]   Cancelled Treatment:    Reason Eval/Treat Not Completed: Patient at procedure or test/ unavailable (pt at [facility], will follow up as schedule permits)  Lynnda Child, OTD, OTR/L Acute Rehab [phone]   Kaylyn Lim 02/16/2022, 8:21 AM

## 2022-02-16 NOTE — Progress Notes (Signed)
  Echocardiogram 2D Echocardiogram has been performed.  Krystal Vargas 02/16/2022, 8:48 AM

## 2022-02-16 NOTE — Plan of Care (Signed)
  Problem: Clinical Measurements: Goal: Respiratory complications will improve Outcome: Progressing Goal: Cardiovascular complication will be avoided Outcome: Progressing   Problem: Nutrition: Goal: Adequate nutrition will be maintained Outcome: Progressing   Problem: Elimination: Goal: Will not experience complications related to bowel motility Outcome: Progressing

## 2022-02-16 NOTE — Discharge Summary (Addendum)
Physician Discharge Summary   Patient: Krystal Vargas MRN: [id number] DOB: [birth year]  Admit date:     02/14/2022  Discharge date: 02/16/22  Discharge Physician: Estill Cotta, MD    PCP: Rich Fuchs, PA   Recommendations at discharge:   Continue aspirin 325 mg daily, statin, BP control Home health PT OT Outpatient follow-up with neurology in 4 weeks Coreg held due to persistent bradycardia  Discharge Diagnoses:  Acute dysarthria, questionable TIA Near syncope Bradycardia Hypertension Diabetes mellitus type II Prior history of CVAs with residual left-sided weakness, dysarthria   Hospital Course: Patient is a 66 year old female with history of remote stroke with residual left-sided weakness, hypertension, diabetes mellitus, mild intermittent asthma presented with strokelike symptoms.  Patient has had labile blood pressures recently and her PCP had adjusted her medications.  Hydralazine was discontinued in May, losartan was recently decreased.  She noticed on Monday, 5 days ago prior to admission that her blood pressure was elevated in 180s to 200s which was new compared to her baseline of 130s. She started feeling lightheadedness, denied any fall.  On Tuesday morning, patient woke up with first episode of stuttering speech, and mattering.  She also had 2 nights of palpitations and night sweats.  Patient noticed that her sugars have been running low 60s in the morning.  A day before the admission, patient again woke up with night sweats and started having slurring speech and presented to the hospital.  Assessment and Plan:  Acute dysarthria ?  TIA -Improving -Patient presented with 2 to 3-day history of not feeling well, weakness, stuttering speech, elevated BP -MRI of the brain showed no acute stroke however had chronic microhemorrhages and chronic small vessel disease.   -CTA negative for emergent large vessel occlusion or abdominal findings. -2D echo showed EF of 60 to 65%, mild  LVH, grade 2 DD -LDL 55, hemoglobin A1c 6.1, TSH 1.2 -Neurology evaluated the patient, recommended continue home statin, aspirin 325 mg daily.  Would avoid dual antiplatelets given significant chronic microhemorrhages on the MRI -Outpatient follow-up with her neurologist in 4 weeks -UA negative for UTI, chest x-ray negative for pneumonia -Patient will have speech therapy prior to discharge. -PT OT recommended home health PT  Near syncope -Resolved, likely due to #1, bradycardia and hypertension.  Sinus bradycardia -Likely due to Coreg.  EKG showed no acute blocks, sinus bradycardia -Due to persistent bradycardia, heart rate in 30s at night and otherwise 40s to 50s, Coreg has been discontinued   Hypertension -Continue losartan, Imdur, HCTZ, hydralazine -Coreg discontinued, outpatient follow-up with PCP  Diabetes mellitus -Continue metformin 500 mg twice daily, outpatient follow-up with PCP      Pain control - [facility] Controlled Substance Reporting System database was reviewed. and patient was instructed, not to drive, operate heavy machinery, perform activities at heights, swimming or participation in water activities or provide baby-sitting services while on Pain, Sleep and Anxiety Medications; until their outpatient Physician has advised to do so again. Also recommended to not to take more than prescribed Pain, Sleep and Anxiety Medications.  Consultants: Neurology Procedures performed: None Disposition: Home Diet recommendation:  Discharge Diet Orders (From admission, onward)     Start     Ordered   02/16/22 0000  Diet Carb Modified        02/16/22 1125           Carb modified diet DISCHARGE MEDICATION: Allergies as of 02/16/2022       Reactions   Amlodipine  Physician Discharge Summary   Patient: Krystal Vargas MRN: [id number] DOB: [birth year]  Admit date:     02/14/2022  Discharge date: 02/16/22  Discharge Physician: Estill Cotta, MD    PCP: Rich Fuchs, PA   Recommendations at discharge:   Continue aspirin 325 mg daily, statin, BP control Home health PT OT Outpatient follow-up with neurology in 4 weeks Coreg held due to persistent bradycardia  Discharge Diagnoses:  Acute dysarthria, questionable TIA Near syncope Bradycardia Hypertension Diabetes mellitus type II Prior history of CVAs with residual left-sided weakness, dysarthria   Hospital Course: Patient is a 66 year old female with history of remote stroke with residual left-sided weakness, hypertension, diabetes mellitus, mild intermittent asthma presented with strokelike symptoms.  Patient has had labile blood pressures recently and her PCP had adjusted her medications.  Hydralazine was discontinued in May, losartan was recently decreased.  She noticed on Monday, 5 days ago prior to admission that her blood pressure was elevated in 180s to 200s which was new compared to her baseline of 130s. She started feeling lightheadedness, denied any fall.  On Tuesday morning, patient woke up with first episode of stuttering speech, and mattering.  She also had 2 nights of palpitations and night sweats.  Patient noticed that her sugars have been running low 60s in the morning.  A day before the admission, patient again woke up with night sweats and started having slurring speech and presented to the hospital.  Assessment and Plan:  Acute dysarthria ?  TIA -Improving -Patient presented with 2 to 3-day history of not feeling well, weakness, stuttering speech, elevated BP -MRI of the brain showed no acute stroke however had chronic microhemorrhages and chronic small vessel disease.   -CTA negative for emergent large vessel occlusion or abdominal findings. -2D echo showed EF of 60 to 65%, mild  LVH, grade 2 DD -LDL 55, hemoglobin A1c 6.1, TSH 1.2 -Neurology evaluated the patient, recommended continue home statin, aspirin 325 mg daily.  Would avoid dual antiplatelets given significant chronic microhemorrhages on the MRI -Outpatient follow-up with her neurologist in 4 weeks -UA negative for UTI, chest x-ray negative for pneumonia -Patient will have speech therapy prior to discharge. -PT OT recommended home health PT  Near syncope -Resolved, likely due to #1, bradycardia and hypertension.  Sinus bradycardia -Likely due to Coreg.  EKG showed no acute blocks, sinus bradycardia -Due to persistent bradycardia, heart rate in 30s at night and otherwise 40s to 50s, Coreg has been discontinued   Hypertension -Continue losartan, Imdur, HCTZ, hydralazine -Coreg discontinued, outpatient follow-up with PCP  Diabetes mellitus -Continue metformin 500 mg twice daily, outpatient follow-up with PCP      Pain control - [facility] Controlled Substance Reporting System database was reviewed. and patient was instructed, not to drive, operate heavy machinery, perform activities at heights, swimming or participation in water activities or provide baby-sitting services while on Pain, Sleep and Anxiety Medications; until their outpatient Physician has advised to do so again. Also recommended to not to take more than prescribed Pain, Sleep and Anxiety Medications.  Consultants: Neurology Procedures performed: None Disposition: Home Diet recommendation:  Discharge Diet Orders (From admission, onward)     Start     Ordered   02/16/22 0000  Diet Carb Modified        02/16/22 1125           Carb modified diet DISCHARGE MEDICATION: Allergies as of 02/16/2022       Reactions   Amlodipine  Physician Discharge Summary   Patient: Krystal Vargas MRN: [id number] DOB: [birth year]  Admit date:     02/14/2022  Discharge date: 02/16/22  Discharge Physician: Estill Cotta, MD    PCP: Rich Fuchs, PA   Recommendations at discharge:   Continue aspirin 325 mg daily, statin, BP control Home health PT OT Outpatient follow-up with neurology in 4 weeks Coreg held due to persistent bradycardia  Discharge Diagnoses:  Acute dysarthria, questionable TIA Near syncope Bradycardia Hypertension Diabetes mellitus type II Prior history of CVAs with residual left-sided weakness, dysarthria   Hospital Course: Patient is a 66 year old female with history of remote stroke with residual left-sided weakness, hypertension, diabetes mellitus, mild intermittent asthma presented with strokelike symptoms.  Patient has had labile blood pressures recently and her PCP had adjusted her medications.  Hydralazine was discontinued in May, losartan was recently decreased.  She noticed on Monday, 5 days ago prior to admission that her blood pressure was elevated in 180s to 200s which was new compared to her baseline of 130s. She started feeling lightheadedness, denied any fall.  On Tuesday morning, patient woke up with first episode of stuttering speech, and mattering.  She also had 2 nights of palpitations and night sweats.  Patient noticed that her sugars have been running low 60s in the morning.  A day before the admission, patient again woke up with night sweats and started having slurring speech and presented to the hospital.  Assessment and Plan:  Acute dysarthria ?  TIA -Improving -Patient presented with 2 to 3-day history of not feeling well, weakness, stuttering speech, elevated BP -MRI of the brain showed no acute stroke however had chronic microhemorrhages and chronic small vessel disease.   -CTA negative for emergent large vessel occlusion or abdominal findings. -2D echo showed EF of 60 to 65%, mild  LVH, grade 2 DD -LDL 55, hemoglobin A1c 6.1, TSH 1.2 -Neurology evaluated the patient, recommended continue home statin, aspirin 325 mg daily.  Would avoid dual antiplatelets given significant chronic microhemorrhages on the MRI -Outpatient follow-up with her neurologist in 4 weeks -UA negative for UTI, chest x-ray negative for pneumonia -Patient will have speech therapy prior to discharge. -PT OT recommended home health PT  Near syncope -Resolved, likely due to #1, bradycardia and hypertension.  Sinus bradycardia -Likely due to Coreg.  EKG showed no acute blocks, sinus bradycardia -Due to persistent bradycardia, heart rate in 30s at night and otherwise 40s to 50s, Coreg has been discontinued   Hypertension -Continue losartan, Imdur, HCTZ, hydralazine -Coreg discontinued, outpatient follow-up with PCP  Diabetes mellitus -Continue metformin 500 mg twice daily, outpatient follow-up with PCP      Pain control - [facility] Controlled Substance Reporting System database was reviewed. and patient was instructed, not to drive, operate heavy machinery, perform activities at heights, swimming or participation in water activities or provide baby-sitting services while on Pain, Sleep and Anxiety Medications; until their outpatient Physician has advised to do so again. Also recommended to not to take more than prescribed Pain, Sleep and Anxiety Medications.  Consultants: Neurology Procedures performed: None Disposition: Home Diet recommendation:  Discharge Diet Orders (From admission, onward)     Start     Ordered   02/16/22 0000  Diet Carb Modified        02/16/22 1125           Carb modified diet DISCHARGE MEDICATION: Allergies as of 02/16/2022       Reactions   Amlodipine  Physician Discharge Summary   Patient: Krystal Vargas MRN: [id number] DOB: [birth year]  Admit date:     02/14/2022  Discharge date: 02/16/22  Discharge Physician: Estill Cotta, MD    PCP: Rich Fuchs, PA   Recommendations at discharge:   Continue aspirin 325 mg daily, statin, BP control Home health PT OT Outpatient follow-up with neurology in 4 weeks Coreg held due to persistent bradycardia  Discharge Diagnoses:  Acute dysarthria, questionable TIA Near syncope Bradycardia Hypertension Diabetes mellitus type II Prior history of CVAs with residual left-sided weakness, dysarthria   Hospital Course: Patient is a 66 year old female with history of remote stroke with residual left-sided weakness, hypertension, diabetes mellitus, mild intermittent asthma presented with strokelike symptoms.  Patient has had labile blood pressures recently and her PCP had adjusted her medications.  Hydralazine was discontinued in May, losartan was recently decreased.  She noticed on Monday, 5 days ago prior to admission that her blood pressure was elevated in 180s to 200s which was new compared to her baseline of 130s. She started feeling lightheadedness, denied any fall.  On Tuesday morning, patient woke up with first episode of stuttering speech, and mattering.  She also had 2 nights of palpitations and night sweats.  Patient noticed that her sugars have been running low 60s in the morning.  A day before the admission, patient again woke up with night sweats and started having slurring speech and presented to the hospital.  Assessment and Plan:  Acute dysarthria ?  TIA -Improving -Patient presented with 2 to 3-day history of not feeling well, weakness, stuttering speech, elevated BP -MRI of the brain showed no acute stroke however had chronic microhemorrhages and chronic small vessel disease.   -CTA negative for emergent large vessel occlusion or abdominal findings. -2D echo showed EF of 60 to 65%, mild  LVH, grade 2 DD -LDL 55, hemoglobin A1c 6.1, TSH 1.2 -Neurology evaluated the patient, recommended continue home statin, aspirin 325 mg daily.  Would avoid dual antiplatelets given significant chronic microhemorrhages on the MRI -Outpatient follow-up with her neurologist in 4 weeks -UA negative for UTI, chest x-ray negative for pneumonia -Patient will have speech therapy prior to discharge. -PT OT recommended home health PT  Near syncope -Resolved, likely due to #1, bradycardia and hypertension.  Sinus bradycardia -Likely due to Coreg.  EKG showed no acute blocks, sinus bradycardia -Due to persistent bradycardia, heart rate in 30s at night and otherwise 40s to 50s, Coreg has been discontinued   Hypertension -Continue losartan, Imdur, HCTZ, hydralazine -Coreg discontinued, outpatient follow-up with PCP  Diabetes mellitus -Continue metformin 500 mg twice daily, outpatient follow-up with PCP      Pain control - [facility] Controlled Substance Reporting System database was reviewed. and patient was instructed, not to drive, operate heavy machinery, perform activities at heights, swimming or participation in water activities or provide baby-sitting services while on Pain, Sleep and Anxiety Medications; until their outpatient Physician has advised to do so again. Also recommended to not to take more than prescribed Pain, Sleep and Anxiety Medications.  Consultants: Neurology Procedures performed: None Disposition: Home Diet recommendation:  Discharge Diet Orders (From admission, onward)     Start     Ordered   02/16/22 0000  Diet Carb Modified        02/16/22 1125           Carb modified diet DISCHARGE MEDICATION: Allergies as of 02/16/2022       Reactions   Amlodipine  1939 02/15/22 1608  Weight: 68.9 kg 69.8 kg   S: Speech improving, work-up so far negative,  Vitals:   02/15/22 2134 02/16/22 0319 02/16/22 0504 02/16/22 0902  BP: (!) 146/76 (!) 144/55 138/60 (!) 146/66  Pulse: (!) 42 (!) 43 (!) 43 (!) 49  Resp: '17 16 17 17  '$ Temp: 98 F (36.7 C) 97.7 F (36.5 C) 98.1 F (36.7 C) 97.7 F (36.5 C)  TempSrc:  Oral  Oral  SpO2: 100% 96% 92% 98%  Weight:      Height:         Condition at discharge: good  The results of significant diagnostics from this hospitalization (including imaging, microbiology, ancillary and laboratory) are listed below for reference.   Imaging Studies: ECHOCARDIOGRAM COMPLETE  Result Date: 02/16/2022    ECHOCARDIOGRAM REPORT   Patient Name:   Krystal Vargas Date of Exam: 02/16/2022 Medical Rec #:  [id number]  Height:       62.0 in Accession #:    [id number] Weight:       153.9 lb Date of Birth:  [birth year]  BSA:           1.710 m Patient Age:    66 years   BP:           138/60 mmHg Patient Gender: F          HR:           44 bpm. Exam Location:  Inpatient Procedure: 2D Echo Indications:    stroke  History:        Patient has prior history of Echocardiogram examinations, most                 recent 09/16/2019. Risk Factors:Diabetes, Hypertension and                 Dyslipidemia.  Sonographer:    Uriah Referring Phys: [id number] [place]  1. Left ventricular ejection fraction, by estimation, is 60 to 65%. The left ventricle has normal function. The left ventricle has no regional wall motion abnormalities. There is mild left ventricular hypertrophy of the basal-septal segment. Left ventricular diastolic parameters are consistent with Grade II diastolic dysfunction (pseudonormalization).  2. Right ventricular systolic function is normal. The right ventricular size is normal. There is normal pulmonary artery systolic pressure. The estimated right ventricular systolic pressure is 63.0 mmHg.  3. Left atrial size was mildly dilated.  4. The mitral valve is normal in structure. Trivial mitral valve regurgitation. No evidence of mitral stenosis.  5. The aortic valve is normal in structure. Aortic valve regurgitation is trivial. No aortic stenosis is present.  6. The inferior vena cava is normal in size with greater than 50% respiratory variability, suggesting right atrial pressure of 3 mmHg. FINDINGS  Left Ventricle: Left ventricular ejection fraction, by estimation, is 60 to 65%. The left ventricle has normal function. The left ventricle has no regional wall motion abnormalities. The left ventricular internal cavity size was normal in size. There is  mild left ventricular hypertrophy of the basal-septal segment. Left ventricular diastolic parameters are consistent with Grade II diastolic dysfunction (pseudonormalization). Normal left ventricular filling pressure. Right Ventricle: The right ventricular size is  normal. No increase in right ventricular wall thickness. Right ventricular systolic function is normal. There is normal pulmonary artery systolic pressure. The tricuspid regurgitant velocity is 2.54 m/s, and  with an assumed right atrial pressure of 3 mmHg, the estimated right ventricular systolic pressure is 28.8  1939 02/15/22 1608  Weight: 68.9 kg 69.8 kg   S: Speech improving, work-up so far negative,  Vitals:   02/15/22 2134 02/16/22 0319 02/16/22 0504 02/16/22 0902  BP: (!) 146/76 (!) 144/55 138/60 (!) 146/66  Pulse: (!) 42 (!) 43 (!) 43 (!) 49  Resp: '17 16 17 17  '$ Temp: 98 F (36.7 C) 97.7 F (36.5 C) 98.1 F (36.7 C) 97.7 F (36.5 C)  TempSrc:  Oral  Oral  SpO2: 100% 96% 92% 98%  Weight:      Height:         Condition at discharge: good  The results of significant diagnostics from this hospitalization (including imaging, microbiology, ancillary and laboratory) are listed below for reference.   Imaging Studies: ECHOCARDIOGRAM COMPLETE  Result Date: 02/16/2022    ECHOCARDIOGRAM REPORT   Patient Name:   Krystal Vargas Date of Exam: 02/16/2022 Medical Rec #:  [id number]  Height:       62.0 in Accession #:    [id number] Weight:       153.9 lb Date of Birth:  [birth year]  BSA:           1.710 m Patient Age:    66 years   BP:           138/60 mmHg Patient Gender: F          HR:           44 bpm. Exam Location:  Inpatient Procedure: 2D Echo Indications:    stroke  History:        Patient has prior history of Echocardiogram examinations, most                 recent 09/16/2019. Risk Factors:Diabetes, Hypertension and                 Dyslipidemia.  Sonographer:    Uriah Referring Phys: [id number] [place]  1. Left ventricular ejection fraction, by estimation, is 60 to 65%. The left ventricle has normal function. The left ventricle has no regional wall motion abnormalities. There is mild left ventricular hypertrophy of the basal-septal segment. Left ventricular diastolic parameters are consistent with Grade II diastolic dysfunction (pseudonormalization).  2. Right ventricular systolic function is normal. The right ventricular size is normal. There is normal pulmonary artery systolic pressure. The estimated right ventricular systolic pressure is 63.0 mmHg.  3. Left atrial size was mildly dilated.  4. The mitral valve is normal in structure. Trivial mitral valve regurgitation. No evidence of mitral stenosis.  5. The aortic valve is normal in structure. Aortic valve regurgitation is trivial. No aortic stenosis is present.  6. The inferior vena cava is normal in size with greater than 50% respiratory variability, suggesting right atrial pressure of 3 mmHg. FINDINGS  Left Ventricle: Left ventricular ejection fraction, by estimation, is 60 to 65%. The left ventricle has normal function. The left ventricle has no regional wall motion abnormalities. The left ventricular internal cavity size was normal in size. There is  mild left ventricular hypertrophy of the basal-septal segment. Left ventricular diastolic parameters are consistent with Grade II diastolic dysfunction (pseudonormalization). Normal left ventricular filling pressure. Right Ventricle: The right ventricular size is  normal. No increase in right ventricular wall thickness. Right ventricular systolic function is normal. There is normal pulmonary artery systolic pressure. The tricuspid regurgitant velocity is 2.54 m/s, and  with an assumed right atrial pressure of 3 mmHg, the estimated right ventricular systolic pressure is 28.8  Physician Discharge Summary   Patient: Krystal Vargas MRN: [id number] DOB: [birth year]  Admit date:     02/14/2022  Discharge date: 02/16/22  Discharge Physician: Estill Cotta, MD    PCP: Rich Fuchs, PA   Recommendations at discharge:   Continue aspirin 325 mg daily, statin, BP control Home health PT OT Outpatient follow-up with neurology in 4 weeks Coreg held due to persistent bradycardia  Discharge Diagnoses:  Acute dysarthria, questionable TIA Near syncope Bradycardia Hypertension Diabetes mellitus type II Prior history of CVAs with residual left-sided weakness, dysarthria   Hospital Course: Patient is a 66 year old female with history of remote stroke with residual left-sided weakness, hypertension, diabetes mellitus, mild intermittent asthma presented with strokelike symptoms.  Patient has had labile blood pressures recently and her PCP had adjusted her medications.  Hydralazine was discontinued in May, losartan was recently decreased.  She noticed on Monday, 5 days ago prior to admission that her blood pressure was elevated in 180s to 200s which was new compared to her baseline of 130s. She started feeling lightheadedness, denied any fall.  On Tuesday morning, patient woke up with first episode of stuttering speech, and mattering.  She also had 2 nights of palpitations and night sweats.  Patient noticed that her sugars have been running low 60s in the morning.  A day before the admission, patient again woke up with night sweats and started having slurring speech and presented to the hospital.  Assessment and Plan:  Acute dysarthria ?  TIA -Improving -Patient presented with 2 to 3-day history of not feeling well, weakness, stuttering speech, elevated BP -MRI of the brain showed no acute stroke however had chronic microhemorrhages and chronic small vessel disease.   -CTA negative for emergent large vessel occlusion or abdominal findings. -2D echo showed EF of 60 to 65%, mild  LVH, grade 2 DD -LDL 55, hemoglobin A1c 6.1, TSH 1.2 -Neurology evaluated the patient, recommended continue home statin, aspirin 325 mg daily.  Would avoid dual antiplatelets given significant chronic microhemorrhages on the MRI -Outpatient follow-up with her neurologist in 4 weeks -UA negative for UTI, chest x-ray negative for pneumonia -Patient will have speech therapy prior to discharge. -PT OT recommended home health PT  Near syncope -Resolved, likely due to #1, bradycardia and hypertension.  Sinus bradycardia -Likely due to Coreg.  EKG showed no acute blocks, sinus bradycardia -Due to persistent bradycardia, heart rate in 30s at night and otherwise 40s to 50s, Coreg has been discontinued   Hypertension -Continue losartan, Imdur, HCTZ, hydralazine -Coreg discontinued, outpatient follow-up with PCP  Diabetes mellitus -Continue metformin 500 mg twice daily, outpatient follow-up with PCP      Pain control - [facility] Controlled Substance Reporting System database was reviewed. and patient was instructed, not to drive, operate heavy machinery, perform activities at heights, swimming or participation in water activities or provide baby-sitting services while on Pain, Sleep and Anxiety Medications; until their outpatient Physician has advised to do so again. Also recommended to not to take more than prescribed Pain, Sleep and Anxiety Medications.  Consultants: Neurology Procedures performed: None Disposition: Home Diet recommendation:  Discharge Diet Orders (From admission, onward)     Start     Ordered   02/16/22 0000  Diet Carb Modified        02/16/22 1125           Carb modified diet DISCHARGE MEDICATION: Allergies as of 02/16/2022       Reactions   Amlodipine

## 2022-02-16 NOTE — Progress Notes (Addendum)
PT is recommending HHPT. Met with pt. Pt plans to return home with her husband. Pt's son will transport pt home once she is D/C. Pt has a rollator. Pt agrees with HH. She reports that she used [facility] in the past and she would like to use them again. Contacted Calvin with [facility], left VM regarding referral.   12:55 - Received call from [facility]. He reports that pt was recently their pt and he accepted the referral. Per RN, pt is requesting a rollator. Spoke to pt, she reports that she wants a new rollator and she is going to donate her old one. Informed pt that insurance pays for walkers every 5 years. She reports that the hospital gave her the rollator, but she cant' remember when. Contacted Jasmine with [facility] and she reports that they provided the rollator in May 2021 and she is not eligible for another one until 2026 and pt made aware.

## 2022-02-16 NOTE — Evaluation (Signed)
Physical Therapy Evaluation Patient Details Name: Krystal Vargas MRN: [id number] DOB: [birth year] Today's Date: 02/16/2022  History of Present Illness  Pt is a 66 y/o female admitted for L weakness and speech difficulty. MRI negative. PMH includes CVA with L weakness, HTN, DM, and fibromyalgia.  Clinical Impression  Pt admitted secondary to problem above with deficits below. Pt requiring min guard A for mobility tasks using RW. Reports she feels L thigh/groin pain and some weakness throughout. Recommending HHPT at d/c to address deficits. Will continue to follow acutely.        Recommendations for follow up therapy are one component of a multi-disciplinary discharge planning process, led by the attending physician.  Recommendations may be updated based on patient status, additional functional criteria and insurance authorization.  Follow Up Recommendations Home health PT      Assistance Recommended at Discharge Intermittent Supervision/Assistance  Patient can return home with the following  Assist for transportation;Assistance with cooking/housework    Equipment Recommendations None recommended by PT  Recommendations for Other Services       Functional Status Assessment Patient has had a recent decline in their functional status and demonstrates the ability to make significant improvements in function in a reasonable and predictable amount of time.     Precautions / Restrictions Precautions Precautions: Fall Restrictions Weight Bearing Restrictions: No      Mobility  Bed Mobility Overal bed mobility: Needs Assistance Bed Mobility: Supine to Sit, Sit to Supine     Supine to sit: Min assist Sit to supine: Supervision   General bed mobility comments: Min A for trunk elevation    Transfers Overall transfer level: Needs assistance Equipment used: Rolling walker (2 wheels) Transfers: Sit to/from Stand Sit to Stand: Min guard           General transfer comment: Min guard  for safety    Ambulation/Gait Ambulation/Gait assistance: Min guard Gait Distance (Feet): 75 Feet Assistive device: Rolling walker (2 wheels) Gait Pattern/deviations: Step-through pattern, Decreased stride length, Decreased weight shift to left Gait velocity: Decreased     General Gait Details: Pt reports she feels LLE is more painful and weak and had to exaggerate hip flexion for clearance. No LOB with use of RW. Min guard for safety.  Stairs            Wheelchair Mobility    Modified Rankin (Stroke Patients Only)       Balance Overall balance assessment: Needs assistance Sitting-balance support: No upper extremity supported, Feet supported Sitting balance-Leahy Scale: Good     Standing balance support: No upper extremity supported, Bilateral upper extremity supported Standing balance-Leahy Scale: Fair Standing balance comment: Able to maintain static standing without UE support                             Pertinent Vitals/Pain Pain Assessment Pain Assessment: Faces Faces Pain Scale: Hurts little more Pain Location: LLE Pain Descriptors / Indicators: Grimacing, Guarding Pain Intervention(s): Limited activity within patient's tolerance, Monitored during session, Repositioned    Home Living Family/patient expects to be discharged to:: Private residence Living Arrangements: Spouse/significant other Available Help at Discharge: Family;Available 24 hours/day Type of Home: Apartment Home Access: Elevator       Home Layout: One level Home Equipment: Rollator (4 wheels)      Prior Function Prior Level of Function : Independent/Modified Independent             Mobility Comments: USed  rollator for longer distances. ADLs Comments: Reports independence     Hand Dominance        Extremity/Trunk Assessment   Upper Extremity Assessment Upper Extremity Assessment: Defer to OT evaluation    Lower Extremity Assessment Lower Extremity  Assessment: LLE deficits/detail LLE Deficits / Details: Reporting pain in L groin throughout L hip. Also reports it feels weaker than normal    Cervical / Trunk Assessment Cervical / Trunk Assessment: Kyphotic  Communication   Communication: No difficulties  Cognition Arousal/Alertness: Awake/alert Behavior During Therapy: WFL for tasks assessed/performed Overall Cognitive Status: No family/caregiver present to determine baseline cognitive functioning                                          General Comments      Exercises     Assessment/Plan    PT Assessment Patient needs continued PT services  PT Problem List Decreased strength;Decreased activity tolerance;Decreased balance;Decreased mobility;Pain       PT Treatment Interventions DME instruction;Gait training;Functional mobility training;Therapeutic activities;Therapeutic exercise;Balance training;Patient/family education    PT Goals (Current goals can be found in the Care Plan section)  Acute Rehab PT Goals Patient Stated Goal: to figure out what was going on PT Goal Formulation: With patient Time For Goal Achievement: 03/02/22 Potential to Achieve Goals: Good    Frequency Min 3X/week     Co-evaluation               AM-PAC PT "6 Clicks" Mobility  Outcome Measure Help needed turning from your back to your side while in a flat bed without using bedrails?: A Little Help needed moving from lying on your back to sitting on the side of a flat bed without using bedrails?: A Little Help needed moving to and from a bed to a chair (including a wheelchair)?: A Little Help needed standing up from a chair using your arms (e.g., wheelchair or bedside chair)?: A Little Help needed to walk in hospital room?: A Little Help needed climbing 3-5 steps with a railing? : A Little 6 Click Score: 18    End of Session Equipment Utilized During Treatment: Gait belt Activity Tolerance: Patient tolerated treatment  well Patient left: in bed;with call bell/phone within reach Nurse Communication: Mobility status PT Visit Diagnosis: Unsteadiness on feet (R26.81);Muscle weakness (generalized) (M62.81)    Time: 1001-1014 PT Time Calculation (min) (ACUTE ONLY): 13 min   Charges:   PT Evaluation $PT Eval Low Complexity: 1 Low          Farley Ly, PT, DPT  Acute Rehabilitation Services  Office: [phone]   Lehman Prom 02/16/2022, 11:37 AM

## 2022-02-16 NOTE — Progress Notes (Signed)
PT Cancellation Note  Patient Details Name: Krystal Vargas MRN: [id number] DOB: 09-04-1955   Cancelled Treatment:    Reason Eval/Treat Not Completed: Patient at procedure or test/unavailable Pt currently at echo. Will follow up as schedule allows.   Lou Miner, DPT  Acute Rehabilitation Services  Office: [phone]    Rudean Hitt 02/16/2022, 8:27 AM

## 2022-03-23 ENCOUNTER — Emergency Department (HOSPITAL_COMMUNITY)
Admission: EM | Admit: 2022-03-23 | Discharge: 2022-03-23 | Disposition: A | Discharge status: Home / Self Care | Payer: Medicare Other | Attending: Emergency Medicine | Admitting: Emergency Medicine

## 2022-03-23 ENCOUNTER — Emergency Department (HOSPITAL_COMMUNITY): Payer: Medicare Other

## 2022-03-23 DIAGNOSIS — R109 Unspecified abdominal pain: Secondary | ICD-10-CM | POA: Diagnosis not present

## 2022-03-23 DIAGNOSIS — I10 Essential (primary) hypertension: Secondary | ICD-10-CM | POA: Insufficient documentation

## 2022-03-23 DIAGNOSIS — Z7982 Long term (current) use of aspirin: Secondary | ICD-10-CM | POA: Diagnosis not present

## 2022-03-23 DIAGNOSIS — F8081 Childhood onset fluency disorder: Secondary | ICD-10-CM | POA: Diagnosis present

## 2022-03-23 DIAGNOSIS — F419 Anxiety disorder, unspecified: Secondary | ICD-10-CM | POA: Insufficient documentation

## 2022-03-23 DIAGNOSIS — R55 Syncope and collapse: Secondary | ICD-10-CM | POA: Diagnosis not present

## 2022-03-23 DIAGNOSIS — J45909 Unspecified asthma, uncomplicated: Secondary | ICD-10-CM | POA: Diagnosis not present

## 2022-03-23 DIAGNOSIS — Z7951 Long term (current) use of inhaled steroids: Secondary | ICD-10-CM | POA: Insufficient documentation

## 2022-03-23 DIAGNOSIS — R0789 Other chest pain: Secondary | ICD-10-CM | POA: Diagnosis not present

## 2022-03-23 DIAGNOSIS — Z79899 Other long term (current) drug therapy: Secondary | ICD-10-CM | POA: Insufficient documentation

## 2022-03-23 DIAGNOSIS — R531 Weakness: Secondary | ICD-10-CM | POA: Diagnosis not present

## 2022-03-23 DIAGNOSIS — Z7984 Long term (current) use of oral hypoglycemic drugs: Secondary | ICD-10-CM | POA: Insufficient documentation

## 2022-03-23 DIAGNOSIS — E119 Type 2 diabetes mellitus without complications: Secondary | ICD-10-CM | POA: Diagnosis not present

## 2022-03-23 DIAGNOSIS — F444 Conversion disorder with motor symptom or deficit: Secondary | ICD-10-CM | POA: Diagnosis not present

## 2022-03-23 DIAGNOSIS — Z8673 Personal history of transient ischemic attack (TIA), and cerebral infarction without residual deficits: Secondary | ICD-10-CM | POA: Diagnosis not present

## 2022-03-23 LAB — DIFFERENTIAL
Abs Immature Granulocytes: 0.02 10*3/uL (ref 0.00–0.07)
Basophils Absolute: 0 10*3/uL (ref 0.0–0.1)
Basophils Relative: 0 %
Eosinophils Absolute: 0.1 10*3/uL (ref 0.0–0.5)
Eosinophils Relative: 1 %
Immature Granulocytes: 0 %
Lymphocytes Relative: 32 %
Lymphs Abs: 2.8 10*3/uL (ref 0.7–4.0)
Monocytes Absolute: 0.7 10*3/uL (ref 0.1–1.0)
Monocytes Relative: 8 %
Neutro Abs: 5.1 10*3/uL (ref 1.7–7.7)
Neutrophils Relative %: 59 %

## 2022-03-23 LAB — CBC
HCT: 38.5 % (ref 36.0–46.0)
Hemoglobin: 12.9 g/dL (ref 12.0–15.0)
MCH: 31.2 pg (ref 26.0–34.0)
MCHC: 33.5 g/dL (ref 30.0–36.0)
MCV: 93 fL (ref 80.0–100.0)
Platelets: 198 10*3/uL (ref 150–400)
RBC: 4.14 MIL/uL (ref 3.87–5.11)
RDW: 15 % (ref 11.5–15.5)
WBC: 8.7 10*3/uL (ref 4.0–10.5)
nRBC: 0 % (ref 0.0–0.2)

## 2022-03-23 LAB — I-STAT CHEM 8, ED
BUN: 12 mg/dL (ref 8–23)
Calcium, Ion: 1.08 mmol/L — ABNORMAL LOW (ref 1.15–1.40)
Chloride: 104 mmol/L (ref 98–111)
Creatinine, Ser: 0.9 mg/dL (ref 0.44–1.00)
Glucose, Bld: 99 mg/dL (ref 70–99)
HCT: 39 % (ref 36.0–46.0)
Hemoglobin: 13.3 g/dL (ref 12.0–15.0)
Potassium: 3.5 mmol/L (ref 3.5–5.1)
Sodium: 142 mmol/L (ref 135–145)
TCO2: 26 mmol/L (ref 22–32)

## 2022-03-23 LAB — COMPREHENSIVE METABOLIC PANEL
ALT: 12 U/L (ref 0–44)
AST: 28 U/L (ref 15–41)
Albumin: 4.2 g/dL (ref 3.5–5.0)
Alkaline Phosphatase: 58 U/L (ref 38–126)
Anion gap: 14 (ref 5–15)
BUN: 12 mg/dL (ref 8–23)
CO2: 25 mmol/L (ref 22–32)
Calcium: 9.7 mg/dL (ref 8.9–10.3)
Chloride: 102 mmol/L (ref 98–111)
Creatinine, Ser: 0.93 mg/dL (ref 0.44–1.00)
GFR, Estimated: >60 mL/min (ref >60)
Glucose, Bld: 99 mg/dL (ref 70–99)
Potassium: 3.6 mmol/L (ref 3.5–5.1)
Sodium: 141 mmol/L (ref 135–145)
Total Bilirubin: 0.8 mg/dL (ref 0.3–1.2)
Total Protein: 7.2 g/dL (ref 6.5–8.1)

## 2022-03-23 LAB — ETHANOL: Alcohol, Ethyl (B): <10 mg/dL (ref <10)

## 2022-03-23 LAB — LIPASE, BLOOD: Lipase: 66 U/L — ABNORMAL HIGH (ref 11–51)

## 2022-03-23 LAB — TROPONIN I (HIGH SENSITIVITY): Troponin I (High Sensitivity): 7 ng/L (ref <18)

## 2022-03-23 LAB — BRAIN NATRIURETIC PEPTIDE: B Natriuretic Peptide: 96.4 pg/mL (ref 0.0–100.0)

## 2022-03-23 MED ORDER — MAGNESIUM CITRATE PO SOLN: 1.0000 | Freq: Once | ORAL | 0 refills | Status: AC

## 2022-03-23 MED ORDER — DIAZEPAM 5 MG/ML IJ SOLN: 2.5000 mg | Freq: Once | INTRAMUSCULAR | Status: DC

## 2022-03-23 MED ORDER — ISOSORBIDE MONONITRATE ER 30 MG PO TB24: 30.0000 mg | ORAL_TABLET | Freq: Every day | ORAL | Status: DC

## 2022-03-23 MED ORDER — ACETAMINOPHEN 325 MG PO TABS
650.0000 mg | ORAL_TABLET | Freq: Once | ORAL | Status: AC
  Administered 2022-03-23: 650 mg via ORAL
  Filled 2022-03-23: qty 2

## 2022-03-23 MED ORDER — SODIUM CHLORIDE 0.9% FLUSH
3.0000 mL | Freq: Once | INTRAVENOUS | Status: AC
  Administered 2022-03-23: 3 mL via INTRAVENOUS

## 2022-03-23 MED ORDER — HYDRALAZINE HCL 50 MG PO TABS
100.0000 mg | ORAL_TABLET | Freq: Three times a day (TID) | ORAL | Status: DC
  Administered 2022-03-23: 100 mg via ORAL
  Filled 2022-03-23 (×3): qty 2

## 2022-03-23 MED ORDER — NITROGLYCERIN 2 % TD OINT
1.0000 [in_us] | TOPICAL_OINTMENT | Freq: Once | TRANSDERMAL | Status: AC
  Administered 2022-03-23: 1 [in_us] via TOPICAL
  Filled 2022-03-23: qty 1

## 2022-03-23 MED ORDER — IOHEXOL 350 MG/ML SOLN
75.0000 mL | Freq: Once | INTRAVENOUS | Status: AC | PRN
  Administered 2022-03-23: 75 mL via INTRAVENOUS

## 2022-03-23 NOTE — Consult Note (Signed)
Neurology Consultation  Reason for Consult: stuttering speech Referring Physician: Dr. Doren Custard  CC: Fatigue, malaise and stuttering speech  History is obtained from: Patient and chart  HPI: Krystal Vargas is a 66 y.o. female with history of stroke, hypertension, diabetes and asthma who  presents with acute onset stuttering speech.  She states that yesterday she began to feel fatigue and malaise, and today she began to feel some chest and abdominal pain and felt like she was about to pass out.  She called EMS and was noted to have stuttering speech upon arrival.  Patient had a similar presentation last month with stuttering speech, and stroke work-up was negative.  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  She also reports some blurry vision, but states that this is ongoing.  On exam, patient has stuttering speech but this is distractible.  Repetition and naming intact without dysarthria or difficulty.   LKW: 1340 TNK given?: no, presentation not consistent with stroke IR Thrombectomy? No, no LVO Modified Rankin Scale: 1-No significant post stroke disability and can perform usual duties with stroke symptoms  ROS: A complete ROS was performed and is negative except as noted in the HPI.   Past Medical History:  Diagnosis Date   Asthma    Cancer ([place])    Diabetes mellitus    Fibromyalgia    High cholesterol    Hypertension    Stroke [facility])     Family History  Problem Relation Age of Onset   Hypertension Mother    Diabetes Mellitus II Mother    Bronchitis Father    Hypertension Sister    Diabetes Mellitus II Sister    Hypertension Brother    Diabetes Mellitus II Brother     Social History:   reports that she has quit smoking. She has never used smokeless tobacco. She reports current alcohol use. She reports that she does not use drugs.  Medications  Current Facility-Administered Medications:    sodium chloride flush (NS) 0.9 %  injection 3 mL, 3 mL, Intravenous, Once, Godfrey Pick, MD  Current Outpatient Medications:    albuterol (PROVENTIL) (2.5 MG/3ML) 0.083% nebulizer solution, 3 mLs (2.5 mg total) every 4 (four) hours as needed., Disp: , Rfl:    albuterol (VENTOLIN HFA) 108 (90 Base) MCG/ACT inhaler, USE 2 INHALATIONS BY MOUTH EVERY 6 HOURS AS NEEDED, Disp: , Rfl:    aspirin EC 325 MG tablet, Take 1 tablet (325 mg total) by mouth daily., Disp: 30 tablet, Rfl: 3   atorvastatin (LIPITOR) 80 MG tablet, Take 1 tablet (80 mg total) by mouth every evening., Disp: 30 tablet, Rfl: 3   Biotin 5 MG TBDP, Take 1 tablet by mouth daily. (Patient not taking: Reported on 02/15/2022), Disp: , Rfl:    fluticasone-salmeterol (ADVAIR) 250-50 MCG/ACT AEPB, Inhale 1 puff into the lungs as needed (for wheezing and shortness of breath)., Disp: , Rfl:    fluticasone-salmeterol (WIXELA INHUB) 250-50 MCG/ACT AEPB, Inhale 1 puff into the lungs as needed (for wheezing and shortness of breath)., Disp: , Rfl:    glucose blood (ONETOUCH VERIO) test strip, USE AS DIRECTED TO CHECK  BLOOD SUGAR TWICE DAILY, Disp: , Rfl:    hydrALAZINE (APRESOLINE) 100 MG tablet, Take 100 mg by mouth 3 (three) times daily., Disp: , Rfl:    hydrochlorothiazide (HYDRODIURIL) 25 MG tablet, Take 25 mg by mouth daily., Disp: , Rfl:    isosorbide mononitrate (IMDUR) 30 MG 24 hr tablet, Take  30 mg by mouth daily., Disp: , Rfl:    metFORMIN (GLUCOPHAGE) 500 MG tablet, Take 500 mg by mouth 2 (two) times daily., Disp: , Rfl:    Multiple Vitamin (QUINTABS) TABS, Take 1 tablet by mouth daily., Disp: , Rfl:    Multiple Vitamins-Minerals (MULTIPLE VITAMINS/WOMENS) tablet, Take 1 tablet by mouth daily., Disp: , Rfl:    nitroGLYCERIN (NITROSTAT) 0.4 MG SL tablet, Place 1 tablet (0.4 mg total) under the tongue every 5 (five) minutes as needed for chest pain., Disp: 20 tablet, Rfl: 0   Omega-3 Fatty Acids (FISH OIL) 1000 MG CAPS, Take 1,000 mg by mouth daily., Disp: , Rfl:    omeprazole  (PRILOSEC) 40 MG capsule, Take 40 mg by mouth daily. , Disp: , Rfl:    ONETOUCH VERIO test strip, 1 each 2 (two) times daily., Disp: , Rfl:    Polyethyl Glycol-Propyl Glycol (SYSTANE OP), Place 1 drop into both eyes daily., Disp: , Rfl:    sodium chloride (OCEAN) 0.65 % SOLN nasal spray, Place 1 spray into both nostrils as needed for congestion., Disp: , Rfl:    Exam: Current vital signs: BP (!) 172/78 (BP Location: Right Arm)   Pulse 72   Temp 98.7 F (37.1 C) (Oral)   Resp 16   Wt 72.6 kg   LMP 05/16/2012   SpO2 98%   BMI 29.27 kg/m  Vital signs in last 24 hours: Temp:  [98.7 F (37.1 C)] 98.7 F (37.1 C) (11/11 1445) Pulse Rate:  [72] 72 (11/11 1432) Resp:  [16] 16 (11/11 1432) BP: (172)/(78) 172/78 (11/11 1432) SpO2:  [98 %] 98 % (11/11 1432) Weight:  [72.6 kg] 72.6 kg (11/11 1400)  GENERAL: Awake, alert, moderately anxious and tearful at times Psych: Affect appropriate for situation, patient is anxious but cooperative with examination Head: Normocephalic and atraumatic, without obvious abnormality EENT: Normal conjunctivae, moist mucous membranes, no OP obstruction LUNGS: Normal respiratory effort. Non-labored breathing on room air CV: Regular rate and rhythm on telemetry ABDOMEN: Soft, tender to palpation, non-distended Extremities: warm, well perfused, without obvious deformity  NEURO:  Mental Status: Awake, alert, and oriented to person, place, time, and situation. She is able to provide a clear and coherent history of present illness. Speech/Language: speech is stuttering at times but fluent.   Naming, repetition, fluency, and comprehension intact without aphasia  No neglect is noted Cranial Nerves:  II: PERRL  visual fields full.  III, IV, VI: EOMI. Lid elevation symmetric and full.  V: Sensation is intact to light touch and symmetrical to face.  VII: Face is symmetric resting and smiling.  VIII: Hearing intact to voice IX, X: Palate elevation is symmetric.  Phonation normal.  XI: Normal sternocleidomastoid and trapezius muscle strength XII: Tongue protrudes midline without fasciculations.   Motor: 5/5 strength is all muscle groups.  Tone is normal. Bulk is normal.  Sensation: Intact to light touch bilaterally in all four extremities. No extinction to DSS present.  Coordination: FTN intact bilaterally. HKS intact bilaterally. No pronator drift.  Gait: Deferred  NIHSS: 1a Level of Conscious.: 0 1b LOC Questions: 0 1c LOC Commands: 0 2 Best Gaze: 0 3 Visual: 0 4 Facial Palsy: 0 5a Motor Arm - left: 0 5b Motor Arm - Right: 0 6a Motor Leg - Left: 0 6b Motor Leg - Right: 0 7 Limb Ataxia: 0 8 Sensory: 0 9 Best Language: 0 10 Dysarthria: 0 11 Extinct. and Inatten.: 0 TOTAL: 0   Labs I have reviewed labs  in epic and the results pertinent to this consultation are:  CBC    Component Value Date/Time   WBC 8.7 03/23/2022 1423   RBC 4.14 03/23/2022 1423   HGB 13.3 03/23/2022 1426   HCT 39.0 03/23/2022 1426   PLT 198 03/23/2022 1423   MCV 93.0 03/23/2022 1423   MCH 31.2 03/23/2022 1423   MCHC 33.5 03/23/2022 1423   RDW 15.0 03/23/2022 1423   LYMPHSABS 2.8 03/23/2022 1423   MONOABS 0.7 03/23/2022 1423   EOSABS 0.1 03/23/2022 1423   BASOSABS 0.0 03/23/2022 1423    CMP     Component Value Date/Time   NA 142 03/23/2022 1426   K 3.5 03/23/2022 1426   CL 104 03/23/2022 1426   CO2 26 02/16/2022 0733   GLUCOSE 99 03/23/2022 1426   BUN 12 03/23/2022 1426   CREATININE 0.90 03/23/2022 1426   CALCIUM 9.3 02/16/2022 0733   PROT 6.4 (L) 02/16/2022 0733   ALBUMIN 3.6 02/16/2022 0733   AST 28 02/16/2022 0733   ALT 16 02/16/2022 0733   ALKPHOS 54 02/16/2022 0733   BILITOT 1.0 02/16/2022 0733   GFRNONAA >60 02/16/2022 0733   GFRAA >60 10/06/2019 1315    Lipid Panel     Component Value Date/Time   CHOL 118 02/16/2022 0300   TRIG 27 02/16/2022 0300   HDL 58 02/16/2022 0300   CHOLHDL 2.0 02/16/2022 0300   VLDL 5 02/16/2022 0300    LDLCALC 55 02/16/2022 0300     Imaging I have reviewed the images obtained:  CT-scan of the brain: No acute abnormality, chronic microvascular ischemic disease   Assessment: 66 year old patient with history of stroke, diabetes, hypertension and asthma presents with fatigue and malaise since yesterday, chest and abdominal pain as well as an episode of stuttering speech.  She had a similar presentation with stuttering speech last month, and stroke work-up was negative.  Speech is stuttering at times, but this is distractible.  Repetition and naming are intact without dysarthria or aphasia.  Patient appears anxious and tearful at times.  NIHSS is 0, head CT shows no acute abnormalities.  Presentation is most consistent with functional speech problem, likely due to increased stress.  Impression: Functional speech impairment, likely caused by increased stress  Recommendations: -Presentation not consistent with stroke, and stroke work-up last month was negative.  No further work-up needed -Recommend outpatient therapy to assist patient in dealing with stress -Work-up of fatigue, malaise, and chest and abdominal pain per primary team -Neurology will follow as needed  Pt seen by NP/Neuro and later by MD. Note/plan to be edited by MD as needed.  [place] , MSN, AGACNP-BC [facility] See Amion for schedule and pager information 03/23/2022 2:48 PM    NEUROHOSPITALIST ADDENDUM Performed a face to face diagnostic evaluation.   I have reviewed the contents of history and physical exam as documented by PA/ARNP/Resident and agree with above documentation.  I have discussed and formulated the above plan as documented. Edits to the note have been made as needed.  Impression/Key exam findings/Plan: visibly distraught in the ED with teary eyed. Speech has a stutter to it along with a sense of pressured speech. She does not have true aphasia. She had a very similar  presentation about a month ago and MRI back then was negative for an acute stroke. When I engage in a conversation with her and she is not really focused on her speech, the stutter goes away with fluent speech with  no dysarthria. No expressive or receptive aphasia noted. No arm or leg weakness noted. I reassured the patient that I am not worried about a stroke. CTH obtained which was negative for an acute stroke.  Donnetta Simpers, MD [facility] [phone]   If 7pm to 7am, please call on call as listed on AMION.

## 2022-03-23 NOTE — ED Notes (Signed)
Code Stroke cancelled by Neurology at 1434

## 2022-03-23 NOTE — ED Notes (Signed)
Daughter Webb Laws [phone] would like an update asap

## 2022-03-23 NOTE — ED Notes (Signed)
Nitro patch removed as instructed by MD. Pt complains of headache at this time. Remains alert and oriented x 4. MD is aware.

## 2022-03-23 NOTE — Discharge Instructions (Signed)
There was magnesium citrate sent to your pharmacy.  Take this for further relief of constipation.  You should follow-up with your primary care doctor for ongoing management of this.  You should also follow-up with your cardiologist for further evaluation of your recent chest tightness symptoms.  Your work-up here in the emergency department is reassuring.  Please return at any time for any new or worsening symptoms of concern.

## 2022-03-23 NOTE — ED Triage Notes (Signed)
Pt BIB EMS from home with dysarthria. Hx of 3 strokes that have all started this way. Aox4.

## 2022-03-23 NOTE — ED Provider Notes (Signed)
[facility] EMERGENCY DEPARTMENT Provider Note   CSN: [id number] Arrival date & time: 03/23/22  1418     History  Chief Complaint  Patient presents with   Code Stroke    Krystal Vargas is a 66 y.o. female.  HPI Patient presents for generalized weakness.  Medical history includes asthma, DM, fibromyalgia, HLD, HTN, CVA.  She was hospitalized 1 month ago for dysarthria and questionable TIA.  At that time, she had generalized weakness and stuttering speech.  This was in the setting of elevated blood pressure.  Yesterday, patient reports feeling fatigue.  This caused her to lay in bed all day.  Patient lives with her husband who has dementia.  She is his primary caregiver.  Today, patient experienced what she describes as a knot feeling in her stomach and chest.  When she got up to use the bathroom, she had lightheadedness and near syncope.  For this reason, patient called EMS.  When on scene, EMS noted that she had a stuttering speech.  She also endorsed blurry vision.  She has a history of CVA and states that the symptoms are similar to prior CVAs.  She is not found to have any focal areas of weakness on exam.  Her blood glucose was in the range of 105.  Vital signs are notable for markedly elevated blood pressure.  Patient is not on any blood thinning medications.  She states that her blood pressure has been running high for the past 3 days.  She states that she has been taking her medications as prescribed.    Home Medications Prior to Admission medications   Medication Sig Start Date End Date Taking? Authorizing Provider  albuterol (PROVENTIL) (2.5 MG/3ML) 0.083% nebulizer solution 3 mLs (2.5 mg total) every 4 (four) hours as needed. 05/08/17  Yes [provider]  albuterol (VENTOLIN HFA) 108 (90 Base) MCG/ACT inhaler Inhale 2 puffs into the lungs every 6 (six) hours as needed for wheezing or shortness of breath. 10/01/21  Yes [provider]  aspirin EC 325  MG tablet Take 1 tablet (325 mg total) by mouth daily. 02/16/22  Yes Rai, Ripudeep K, MD  atorvastatin (LIPITOR) 80 MG tablet Take 1 tablet (80 mg total) by mouth every evening. 02/16/22  Yes Rai, Ripudeep K, MD  Biotin 5 MG TBDP Take 1 tablet by mouth daily.   Yes [provider]  fluticasone-salmeterol (ADVAIR) 250-50 MCG/ACT AEPB Inhale 1 puff into the lungs as needed (for wheezing and shortness of breath).   Yes [provider]  fluticasone-salmeterol (WIXELA INHUB) 250-50 MCG/ACT AEPB Inhale 1 puff into the lungs as needed (for wheezing and shortness of breath). 08/06/21  Yes [provider]  hydrALAZINE (APRESOLINE) 100 MG tablet Take 100 mg by mouth 3 (three) times daily. 09/28/21  Yes [provider]  hydrochlorothiazide (HYDRODIURIL) 25 MG tablet Take 25 mg by mouth daily. 08/17/19  Yes [provider]  isosorbide mononitrate (IMDUR) 60 MG 24 hr tablet Take 60 mg by mouth in the morning and at bedtime. 05/11/19  Yes [provider]  lactobacillus acidophilus (BACID) TABS tablet Take 2 tablets by mouth 3 (three) times daily. Probiotic   Yes [provider]  losartan (COZAAR) 100 MG tablet Take 100 mg by mouth daily.   Yes [provider]  magnesium citrate SOLN Take 296 mLs (1 Bottle total) by mouth once for 1 dose. 03/23/22 03/23/22 Yes Godfrey Pick, MD  metFORMIN (GLUCOPHAGE) 500 MG tablet Take 500 mg  by mouth 2 (two) times daily. 02/12/22  Yes [provider]  Multiple Vitamin (QUINTABS) TABS Take 1 tablet by mouth daily.   Yes [provider]  Multiple Vitamins-Minerals (MULTIPLE VITAMINS/WOMENS) tablet Take 1 tablet by mouth daily.   Yes [provider]  nitroGLYCERIN (NITROSTAT) 0.4 MG SL tablet Place 1 tablet (0.4 mg total) under the tongue every 5 (five) minutes as needed for chest pain. 06/10/18  Yes Smith, Delbert Phenix, MD  Omega-3 Fatty Acids (FISH OIL) 1000 MG CAPS Take 1,000 mg by mouth daily.    Yes [provider]  Polyethyl Glycol-Propyl Glycol (SYSTANE OP) Place 1 drop into both eyes daily.   Yes [provider]  potassium chloride (KLOR-CON M) 10 MEQ tablet Take by mouth. 03/21/22  Yes [provider]  sodium chloride (OCEAN) 0.65 % SOLN nasal spray Place 1 spray into both nostrils as needed for congestion.   Yes [provider]  glucose blood (ONETOUCH VERIO) test strip USE AS DIRECTED TO CHECK  BLOOD SUGAR TWICE DAILY 06/29/21   [provider]  [facility] VERIO test strip 1 each 2 (two) times daily. 09/15/21   [provider]      Allergies    Amlodipine, Metoprolol, Doxycycline, Hydralazine, and Prednisone    Review of Systems   Review of Systems  Eyes:  Positive for visual disturbance.  Respiratory:  Positive for chest tightness.   Gastrointestinal:  Positive for abdominal pain.  Neurological:  Positive for speech difficulty, weakness (Generalized) and light-headedness.  All other systems reviewed and are negative.   Physical Exam Updated Vital Signs BP (!) 158/92   Pulse (!) 105   Temp 98.2 F (36.8 C) (Oral)   Resp (!) 24   Ht '5\' 2"'$  (1.575 m)   Wt 72.6 kg   LMP 05/16/2012   SpO2 96%   BMI 29.27 kg/m  Physical Exam Vitals and nursing note reviewed.  Constitutional:      General: She is not in acute distress.    Appearance: Normal appearance. She is well-developed. She is not toxic-appearing or diaphoretic.  HENT:     Head: Normocephalic and atraumatic.     Right Ear: External ear normal.     Left Ear: External ear normal.     Nose: Nose normal.     Mouth/Throat:     Mouth: Mucous membranes are moist.     Pharynx: Oropharynx is clear.  Eyes:     Extraocular Movements: Extraocular movements intact.     Conjunctiva/sclera: Conjunctivae normal.  Cardiovascular:     Rate and Rhythm: Normal rate and regular rhythm.     Heart sounds: No murmur heard. Pulmonary:     Effort: Pulmonary effort is normal. No  respiratory distress.     Breath sounds: Normal breath sounds.  Abdominal:     General: There is no distension.     Palpations: Abdomen is soft.     Tenderness: There is no abdominal tenderness. There is no guarding.  Musculoskeletal:        General: No swelling. Normal range of motion.     Cervical back: Normal range of motion and neck supple.     Right lower leg: No edema.     Left lower leg: No edema.  Skin:    General: Skin is warm and dry.     Coloration: Skin is not jaundiced or pale.  Neurological:     General: No focal deficit present.     Mental Status: She  is alert and oriented to person, place, and time.     Cranial Nerves: Dysarthria present. No cranial nerve deficit or facial asymmetry.     Sensory: Sensation is intact. No sensory deficit.     Motor: Motor function is intact. No weakness.     Coordination: Coordination normal.  Psychiatric:        Mood and Affect: Mood is anxious.        Behavior: Behavior normal.        Thought Content: Thought content normal.        Judgment: Judgment normal.     ED Results / Procedures / Treatments   Labs (all labs ordered are listed, but only abnormal results are displayed) Labs Reviewed  LIPASE, BLOOD - Abnormal; Notable for the following components:      Result Value   Lipase 66 (*)    All other components within normal limits  I-STAT CHEM 8, ED - Abnormal; Notable for the following components:   Calcium, Ion 1.08 (*)    All other components within normal limits  CBC  DIFFERENTIAL  COMPREHENSIVE METABOLIC PANEL  ETHANOL  BRAIN NATRIURETIC PEPTIDE  PROTIME-INR  APTT  LIPASE, BLOOD  CBG MONITORING, ED  TROPONIN I (HIGH SENSITIVITY)  TROPONIN I (HIGH SENSITIVITY)    EKG EKG Interpretation  Date/Time:  Saturday March 23 2022 14:41:28 EST Ventricular Rate:  80 PR Interval:  143 QRS Duration: 93 QT Interval:  369 QTC Calculation: 426 R Axis:   76 Text Interpretation: Sinus rhythm RSR' in V1 or V2,  probably normal variant Borderline T wave abnormalities Confirmed by Godfrey Pick [phone]) on 03/23/2022 2:56:36 PM  Radiology CT Angio Chest PE W and/or Wo Contrast  Result Date: 03/23/2022 CLINICAL DATA:  Abdominal pain, dysarthria EXAM: CT ANGIOGRAPHY CHEST CT ABDOMEN AND PELVIS WITH CONTRAST TECHNIQUE: Multidetector CT imaging of the chest was performed using the standard protocol during bolus administration of intravenous contrast. Multiplanar CT image reconstructions and MIPs were obtained to evaluate the vascular anatomy. Multidetector CT imaging of the abdomen and pelvis was performed using the standard protocol during bolus administration of intravenous contrast. RADIATION DOSE REDUCTION: This exam was performed according to the departmental dose-optimization program which includes automated exposure control, adjustment of the mA and/or kV according to patient size and/or use of iterative reconstruction technique. CONTRAST:  16m OMNIPAQUE IOHEXOL 350 MG/ML SOLN COMPARISON:  07/03/2021 FINDINGS: CTA CHEST FINDINGS Cardiovascular: Heart size normal. No pericardial effusion. Dilated central pulmonary arteries. Satisfactory opacification of pulmonary arteries noted, and there is no evidence of pulmonary emboli. LAD coronary calcifications. Adequate contrast opacification of the thoracic aorta with no evidence of dissection, aneurysm, or stenosis. There is bovine variant brachiocephalic arch anatomy without proximal stenosis. Mediastinum/Nodes: No mass or adenopathy. Lungs/Pleura: No pleural effusion. No pneumothorax. Lungs are clear. Musculoskeletal: No chest wall abnormality. No acute or significant osseous findings. Review of the MIP images confirms the above findings. CT ABDOMEN and PELVIS FINDINGS Hepatobiliary: 2.5 cm low-attenuation lesion in segment 7, stable since 02/10/2017 consistent with benign process. No new liver lesion or biliary ductal dilatation. Gallbladder nondistended. No calcified  gallstones. Pancreas: Unremarkable. No pancreatic ductal dilatation or surrounding inflammatory changes. Spleen: Normal in size without focal abnormality. Adrenals/Urinary Tract: Adrenal glands are unremarkable. Kidneys are normal, without renal calculi, focal lesion, or hydronephrosis. Bladder is unremarkable. Stomach/Bowel: Stomach is partially distended by ingested material, unremarkable. Small bowel decompressed. Normal appendix. The colon is incompletely distended, with multiple sigmoid diverticula without adjacent inflammatory change. Vascular/Lymphatic: Moderate  calcified aortoiliac atheromatous plaque without aneurysm or evident stenosis. Portal vein patent. No abdominal or pelvic adenopathy. Reproductive: Status post hysterectomy. No adnexal masses. Other: Bilateral pelvic phleboliths.  No ascites.  No free air. Musculoskeletal: Lower lumbar facet DJD allowing early grade 1 anterolisthesis L4-5. No acute findings. Review of the MIP images confirms the above findings. IMPRESSION: 1. Negative for acute PE or thoracic aortic dissection. 2. Dilated central pulmonary arteries suggesting pulmonary arterial hypertension. 3. Sigmoid diverticulosis. Aortic Atherosclerosis (ICD10-I70.0). Electronically Signed   By: Lucrezia Europe M.D.   On: 03/23/2022 18:55   CT ABDOMEN PELVIS W CONTRAST  Result Date: 03/23/2022 CLINICAL DATA:  Abdominal pain, dysarthria EXAM: CT ANGIOGRAPHY CHEST CT ABDOMEN AND PELVIS WITH CONTRAST TECHNIQUE: Multidetector CT imaging of the chest was performed using the standard protocol during bolus administration of intravenous contrast. Multiplanar CT image reconstructions and MIPs were obtained to evaluate the vascular anatomy. Multidetector CT imaging of the abdomen and pelvis was performed using the standard protocol during bolus administration of intravenous contrast. RADIATION DOSE REDUCTION: This exam was performed according to the departmental dose-optimization program which includes  automated exposure control, adjustment of the mA and/or kV according to patient size and/or use of iterative reconstruction technique. CONTRAST:  110m OMNIPAQUE IOHEXOL 350 MG/ML SOLN COMPARISON:  07/03/2021 FINDINGS: CTA CHEST FINDINGS Cardiovascular: Heart size normal. No pericardial effusion. Dilated central pulmonary arteries. Satisfactory opacification of pulmonary arteries noted, and there is no evidence of pulmonary emboli. LAD coronary calcifications. Adequate contrast opacification of the thoracic aorta with no evidence of dissection, aneurysm, or stenosis. There is bovine variant brachiocephalic arch anatomy without proximal stenosis. Mediastinum/Nodes: No mass or adenopathy. Lungs/Pleura: No pleural effusion. No pneumothorax. Lungs are clear. Musculoskeletal: No chest wall abnormality. No acute or significant osseous findings. Review of the MIP images confirms the above findings. CT ABDOMEN and PELVIS FINDINGS Hepatobiliary: 2.5 cm low-attenuation lesion in segment 7, stable since 02/10/2017 consistent with benign process. No new liver lesion or biliary ductal dilatation. Gallbladder nondistended. No calcified gallstones. Pancreas: Unremarkable. No pancreatic ductal dilatation or surrounding inflammatory changes. Spleen: Normal in size without focal abnormality. Adrenals/Urinary Tract: Adrenal glands are unremarkable. Kidneys are normal, without renal calculi, focal lesion, or hydronephrosis. Bladder is unremarkable. Stomach/Bowel: Stomach is partially distended by ingested material, unremarkable. Small bowel decompressed. Normal appendix. The colon is incompletely distended, with multiple sigmoid diverticula without adjacent inflammatory change. Vascular/Lymphatic: Moderate calcified aortoiliac atheromatous plaque without aneurysm or evident stenosis. Portal vein patent. No abdominal or pelvic adenopathy. Reproductive: Status post hysterectomy. No adnexal masses. Other: Bilateral pelvic phleboliths.  No  ascites.  No free air. Musculoskeletal: Lower lumbar facet DJD allowing early grade 1 anterolisthesis L4-5. No acute findings. Review of the MIP images confirms the above findings. IMPRESSION: 1. Negative for acute PE or thoracic aortic dissection. 2. Dilated central pulmonary arteries suggesting pulmonary arterial hypertension. 3. Sigmoid diverticulosis. Aortic Atherosclerosis (ICD10-I70.0). Electronically Signed   By: DLucrezia EuropeM.D.   On: 03/23/2022 18:55   DG Chest Portable 1 View  Result Date: 03/23/2022 CLINICAL DATA:  Dysarthria, history of stroke EXAM: PORTABLE CHEST 1 VIEW COMPARISON:  02/14/2022 FINDINGS: The heart size and mediastinal contours are within normal limits. Both lungs are clear. The visualized skeletal structures are unremarkable. IMPRESSION: No acute abnormality of the lungs in AP portable projection. Electronically Signed   By: ADelanna AhmadiM.D.   On: 03/23/2022 15:32   CT HEAD CODE STROKE WO CONTRAST  Result Date: 03/23/2022 CLINICAL DATA:  Code stroke.  Neuro  deficit, acute, stroke suspected EXAM: CT HEAD WITHOUT CONTRAST TECHNIQUE: Contiguous axial images were obtained from the base of the skull through the vertex without intravenous contrast. RADIATION DOSE REDUCTION: This exam was performed according to the departmental dose-optimization program which includes automated exposure control, adjustment of the mA and/or kV according to patient size and/or use of iterative reconstruction technique. COMPARISON:  February 15, 2022. FINDINGS: Brain: No evidence of acute large vascular territory infarction, hemorrhage, hydrocephalus, extra-axial collection or mass lesion/mass effect. Patchy white matter hypodensities, nonspecific but compatible with chronic microvascular ischemic disease. Vascular: No hyperdense vessel identified. Skull: No acute fracture. Sinuses/Orbits: Clear sinuses.  No focal findings. Other: No mastoid effusions. ASPECTS [facility] Stroke Program Early CT Score) total  score (0-10 with 10 being normal): 10. IMPRESSION: 1. No evidence of acute intracranial abnormality.  ASPECTS is 10. 2. Chronic microvascular ischemic disease. Code stroke imaging results were communicated on 03/23/2022 at 2:39 pm to provider Dr. Lorrin Goodell via secure text paging. Electronically Signed   By: Margaretha Sheffield M.D.   On: 03/23/2022 14:39    Procedures Procedures    Medications Ordered in ED Medications  hydrALAZINE (APRESOLINE) tablet 100 mg (100 mg Oral Given 03/23/22 1620)  isosorbide mononitrate (IMDUR) 24 hr tablet 30 mg (has no administration in time range)  sodium chloride flush (NS) 0.9 % injection 3 mL (3 mLs Intravenous Given 03/23/22 1452)  nitroGLYCERIN (NITROGLYN) 2 % ointment 1 inch (1 inch Topical Given 03/23/22 1624)  acetaminophen (TYLENOL) tablet 650 mg (650 mg Oral Given 03/23/22 1856)  iohexol (OMNIPAQUE) 350 MG/ML injection 75 mL (75 mLs Intravenous Contrast Given 03/23/22 1827)    ED Course/ Medical Decision Making/ A&P                           Medical Decision Making Amount and/or Complexity of Data Reviewed Labs: ordered. Radiology: ordered.  Risk OTC drugs. Prescription drug management.   This patient presents to the ED for concern of stuttering speech, this involves an extensive number of treatment options, and is a complaint that carries with it a high risk of complications and morbidity.  The differential diagnosis includes CVA, TIA, seizure, hypoglycemia, anxiety, metabolic derangements   Co morbidities that complicate the patient evaluation  asthma, DM, fibromyalgia, HLD, HTN, CVA   Additional history obtained:  Additional history obtained from EMS External records from outside source obtained and reviewed including EMR   Lab Tests:  I Ordered, and personally interpreted labs.  The pertinent results include: Kidney function, normal electrolytes, normal troponin, normal BNP   Imaging Studies ordered:  I ordered imaging  studies including CT head, chest x-ray, CTA chest, CT of abdomen pelvis I independently visualized and interpreted imaging which showed no acute findings I agree with the radiologist interpretation   Cardiac Monitoring: / EKG:  The patient was maintained on a cardiac monitor.  I personally viewed and interpreted the cardiac monitored which showed an underlying rhythm of: Sinus rhythm   Consultations Obtained:  I requested consultation with the neurologist, Dr.Khaliqdina,  and discussed lab and imaging findings as well as pertinent plan - they recommend: No further stroke work-up   Problem List / ED Course / Critical interventions / Medication management  Patient is a 66 year old female who arrives via EMS as a code stroke.  She initially called them out for general feeling of malaise.  When they got there, they noted stuttering speech and patient stated that this is reminiscent of  prior strokes.  On arrival, patient is alert and oriented.  Although she does have stuttering speech, there are no other focal deficits present on exam.  Speech was resolved when distracted.  At this point, patient endorsed a odd feeling in her abdomen and a tightness in her chest.  This prompted cardiac work-up in addition to CTA of chest and CT of abdomen and pelvis.  Patient was given home blood pressure medications in addition to NTG ointment for treatment of her hypertension.  She did have resolution of hypertension but subsequently developed a headache.  NTG ointment was removed and Tylenol was given.  Results of all lab work and imaging studies are reassuring.  Patient was informed of this.  She is concerned about recent constipation and has been treating this at home with Dulcolax, enemas, suppositories.  She was prescribed magnesium citrate.  At this time, she is stable for discharge with PCP follow-up. I ordered medication including NTG, hydralzine for hypertension, Tylenol for headache Reevaluation of the  patient after these medicines showed that the patient improved I have reviewed the patients home medicines and have made adjustments as needed   Social Determinants of Health:  Has PCP        Final Clinical Impression(s) / ED Diagnoses Final diagnoses:  Stuttering  Chest tightness    Rx / DC Orders ED Discharge Orders          Ordered    magnesium citrate SOLN   Once        03/23/22 1934              Godfrey Pick, MD 03/23/22 1937

## 2022-08-27 ENCOUNTER — Emergency Department (HOSPITAL_COMMUNITY): Payer: Medicare Other

## 2022-08-27 ENCOUNTER — Emergency Department (HOSPITAL_COMMUNITY)
Admission: EM | Admit: 2022-08-27 | Discharge: 2022-08-27 | Disposition: A | Discharge status: Home / Self Care | Payer: Medicare Other | Attending: Emergency Medicine | Admitting: Emergency Medicine

## 2022-08-27 ENCOUNTER — Other Ambulatory Visit: Payer: Self-pay

## 2022-08-27 DIAGNOSIS — K5900 Constipation, unspecified: Secondary | ICD-10-CM | POA: Diagnosis not present

## 2022-08-27 DIAGNOSIS — Z7984 Long term (current) use of oral hypoglycemic drugs: Secondary | ICD-10-CM | POA: Diagnosis not present

## 2022-08-27 DIAGNOSIS — Z7982 Long term (current) use of aspirin: Secondary | ICD-10-CM | POA: Insufficient documentation

## 2022-08-27 DIAGNOSIS — E119 Type 2 diabetes mellitus without complications: Secondary | ICD-10-CM | POA: Insufficient documentation

## 2022-08-27 DIAGNOSIS — R1084 Generalized abdominal pain: Secondary | ICD-10-CM | POA: Diagnosis present

## 2022-08-27 DIAGNOSIS — D649 Anemia, unspecified: Secondary | ICD-10-CM | POA: Insufficient documentation

## 2022-08-27 LAB — CBC WITH DIFFERENTIAL/PLATELET
Abs Immature Granulocytes: 0.01 10*3/uL (ref 0.00–0.07)
Basophils Absolute: 0 10*3/uL (ref 0.0–0.1)
Basophils Relative: 0 %
Eosinophils Absolute: 0.1 10*3/uL (ref 0.0–0.5)
Eosinophils Relative: 2 %
HCT: 36 % (ref 36.0–46.0)
Hemoglobin: 11.7 g/dL — ABNORMAL LOW (ref 12.0–15.0)
Immature Granulocytes: 0 %
Lymphocytes Relative: 31 %
Lymphs Abs: 2.1 10*3/uL (ref 0.7–4.0)
MCH: 30.8 pg (ref 26.0–34.0)
MCHC: 32.5 g/dL (ref 30.0–36.0)
MCV: 94.7 fL (ref 80.0–100.0)
Monocytes Absolute: 0.6 10*3/uL (ref 0.1–1.0)
Monocytes Relative: 9 %
Neutro Abs: 3.9 10*3/uL (ref 1.7–7.7)
Neutrophils Relative %: 58 %
Platelets: 285 10*3/uL (ref 150–400)
RBC: 3.8 MIL/uL — ABNORMAL LOW (ref 3.87–5.11)
RDW: 14.5 % (ref 11.5–15.5)
WBC: 6.8 10*3/uL (ref 4.0–10.5)
nRBC: 0 % (ref 0.0–0.2)

## 2022-08-27 LAB — URINALYSIS, ROUTINE W REFLEX MICROSCOPIC
Bilirubin Urine: NEGATIVE
Glucose, UA: NEGATIVE mg/dL
Hgb urine dipstick: NEGATIVE
Ketones, ur: NEGATIVE mg/dL
Leukocytes,Ua: NEGATIVE
Nitrite: NEGATIVE
Protein, ur: NEGATIVE mg/dL
Specific Gravity, Urine: 1.012 (ref 1.005–1.030)
pH: 8 (ref 5.0–8.0)

## 2022-08-27 LAB — COMPREHENSIVE METABOLIC PANEL
ALT: 11 U/L (ref 0–44)
AST: 22 U/L (ref 15–41)
Albumin: 4.1 g/dL (ref 3.5–5.0)
Alkaline Phosphatase: 52 U/L (ref 38–126)
Anion gap: 3 — ABNORMAL LOW (ref 5–15)
BUN: 13 mg/dL (ref 8–23)
CO2: 28 mmol/L (ref 22–32)
Calcium: 9.3 mg/dL (ref 8.9–10.3)
Chloride: 102 mmol/L (ref 98–111)
Creatinine, Ser: 0.82 mg/dL (ref 0.44–1.00)
GFR, Estimated: >60 mL/min (ref >60)
Glucose, Bld: 95 mg/dL (ref 70–99)
Potassium: 3.5 mmol/L (ref 3.5–5.1)
Sodium: 133 mmol/L — ABNORMAL LOW (ref 135–145)
Total Bilirubin: 0.6 mg/dL (ref 0.3–1.2)
Total Protein: 7.2 g/dL (ref 6.5–8.1)

## 2022-08-27 LAB — CBG MONITORING, ED: Glucose-Capillary: 101 mg/dL — ABNORMAL HIGH (ref 70–99)

## 2022-08-27 LAB — POC OCCULT BLOOD, ED: Fecal Occult Bld: NEGATIVE

## 2022-08-27 MED ORDER — IOHEXOL 300 MG/ML  SOLN
100.0000 mL | Freq: Once | INTRAMUSCULAR | Status: AC | PRN
  Administered 2022-08-27: 100 mL via INTRAVENOUS

## 2022-08-27 MED ORDER — ACETAMINOPHEN 325 MG PO TABS
650.0000 mg | ORAL_TABLET | Freq: Once | ORAL | Status: AC
  Administered 2022-08-27: 650 mg via ORAL
  Filled 2022-08-27: qty 2

## 2022-08-27 MED ORDER — ACETAMINOPHEN ER 650 MG PO TBCR: 650.0000 mg | EXTENDED_RELEASE_TABLET | Freq: Three times a day (TID) | ORAL | 0 refills | Status: AC | PRN

## 2022-08-27 NOTE — ED Provider Notes (Signed)
Care assumed from [facility], PA-C at shift change pending CT abdomen. See her note for full HPI.  In short, patient is a 67 year old female who presents to the ED due to diffuse abdominal pain associate with constipation.  Had a bowel movement earlier today which was normal.  Admits to some blood in stool.  She also admits to bilateral feet pain. History of diabetes. No injury. No fever or chills.  Physical Exam  BP (!) 147/75 (BP Location: Left Arm)   Pulse 71   Temp 98.7 F (37.1 C) (Oral)   Resp 16   Wt 72 kg   LMP 05/16/2012   SpO2 100%   BMI 29.03 kg/m   Physical Exam Vitals and nursing note reviewed.  Constitutional:      General: She is not in acute distress.    Appearance: She is not ill-appearing.  HENT:     Head: Normocephalic.  Eyes:     Pupils: Pupils are equal, round, and reactive to light.  Cardiovascular:     Rate and Rhythm: Normal rate and regular rhythm.     Pulses: Normal pulses.     Heart sounds: Normal heart sounds. No murmur heard.    No friction rub. No gallop.  Pulmonary:     Effort: Pulmonary effort is normal.     Breath sounds: Normal breath sounds.  Abdominal:     General: Abdomen is flat. There is no distension.     Palpations: Abdomen is soft.     Tenderness: There is no abdominal tenderness. There is no guarding or rebound.  Musculoskeletal:        General: Normal range of motion.     Cervical back: Neck supple.  Skin:    General: Skin is warm and dry.  Neurological:     General: No focal deficit present.     Mental Status: She is alert.  Psychiatric:        Mood and Affect: Mood normal.        Behavior: Behavior normal.     Procedures  Procedures  ED Course / MDM   Clinical Course as of 08/27/22 1838  Tue Aug 27, 2022  1610 Fecal Occult Blood, POC: NEGATIVE [CA]    Clinical Course User Index [CA] Mannie Stabile, PA-C   Medical Decision Making Amount and/or Complexity of Data Reviewed Independent Historian:  spouse Labs: ordered. Decision-making details documented in ED Course. Radiology: ordered and independent interpretation performed. Decision-making details documented in ED Course.  Risk OTC drugs. Prescription drug management.   Care assumed from [facility], PA-C at shift change pending CT abdomen. See her note for full MDM.  CBC with no leukocytosis.  Mild anemia with heme 11.7.  CMP reassuring.  Normal renal function.  No major electrolyte derangements.  UA negative for signs of infection.  6:11 PM reassessed patient at bedside.  Patient resting comfortably in bed.  She admits to generalized abdominal pain.  Had a bowel movement earlier today which was normal.  Last bowel movement prior to today was Friday.  Patient notes there was possibly some blood in her stool earlier today.  No nausea or vomiting.  Also endorses pain in her bilateral feet.  History of diabetes.  No history of neuropathy.  CT abdomen personally reviewed and interpreted which is negative for any acute abnormalities.  Does demonstrate moderate stool burden without evidence of obstruction.  Rectal exam performed with chaperone in room.  1 external hemorrhoid without evidence of thrombosis.  Did not feel any stool that I was able to disimpact.  Patient notes she had a full bowel movement earlier today. There is no evidence of infection in any of her toes.  Low suspicion for septic joint or gout.  Suspect her burning/pain in her feet is likely related to diabetic neuropathy.  Advised patient to follow-up with PCP.  Patient given Tylenol for her pain.  Prescription sent to pharmacy. Fecal occult negative.  Low suspicion for GI bleed.  Patient stable for discharge. Strict ED precautions discussed with patient. Patient states understanding and agrees to plan. Patient discharged home in no acute distress and stable vitals         Jesusita Oka 08/27/22 1842    Loetta Rough, MD 08/28/22 [phone]

## 2022-08-27 NOTE — ED Triage Notes (Signed)
C/o generalized abd pain, nausea, and constipation GEX:[id number]  Hx DM Also c/o bilateral toe pain and tingling to feet with burning sensation and reported to PCP 1 month ago with no improvement.

## 2022-08-27 NOTE — Discharge Instructions (Addendum)
It was a pleasure taking care of you today.  As discussed, your CT scan was normal.  I am sending you home with Tylenol.  Take as needed for pain.  Please follow-up with PCP within the next few days for recheck.  Your pain in your feet could be related to nerve pain from your diabetes.  There is no blood in your stool.  Return to the ER for new or worsening symptoms.

## 2022-08-27 NOTE — ED Provider Notes (Cosign Needed Addendum)
[facility] AT [facility] Provider Note   CSN: [id number] Arrival date & time: 08/27/22  1301     History  Chief Complaint  Patient presents with   Constipation   Leg Pain    Krystal Vargas is a 67 y.o. female.  Patient complains of abdominal discomfort.  Patient reports she feels like there is something wrong inside of her abdomen.  Patient reports that she has had constipation.  Patient states she just went to the restroom and passed some stool with blood.  Patient reports that she takes laxative at home with no relief.  Patient also complains of pain in her left big toe.  Patient is scheduled to see a podiatrist.  Patient reports episodes of tingling in bilateral feet.  The history is provided by the patient. No language interpreter was used.  Constipation Severity:  Severe Time since last bowel movement:  3 weeks Timing:  Constant Chronicity:  New Stool description:  None produced Leg Pain      Home Medications Prior to Admission medications   Medication Sig Start Date End Date Taking? Authorizing Provider  albuterol (PROVENTIL) (2.5 MG/3ML) 0.083% nebulizer solution 3 mLs (2.5 mg total) every 4 (four) hours as needed. 05/08/17   [provider]  albuterol (VENTOLIN HFA) 108 (90 Base) MCG/ACT inhaler Inhale 2 puffs into the lungs every 6 (six) hours as needed for wheezing or shortness of breath. 10/01/21   [provider]  aspirin EC 325 MG tablet Take 1 tablet (325 mg total) by mouth daily. 02/16/22   Rai, Delene Ruffini, MD  atorvastatin (LIPITOR) 80 MG tablet Take 1 tablet (80 mg total) by mouth every evening. 02/16/22   Rai, Delene Ruffini, MD  Biotin 5 MG TBDP Take 1 tablet by mouth daily.    [provider]  fluticasone-salmeterol (ADVAIR) 250-50 MCG/ACT AEPB Inhale 1 puff into the lungs as needed (for wheezing and shortness of breath).    [provider]  fluticasone-salmeterol (WIXELA INHUB) 250-50 MCG/ACT AEPB  Inhale 1 puff into the lungs as needed (for wheezing and shortness of breath). 08/06/21   [provider]  glucose blood (ONETOUCH VERIO) test strip USE AS DIRECTED TO CHECK  BLOOD SUGAR TWICE DAILY 06/29/21   [provider]  hydrALAZINE (APRESOLINE) 100 MG tablet Take 100 mg by mouth 3 (three) times daily. 09/28/21   [provider]  hydrochlorothiazide (HYDRODIURIL) 25 MG tablet Take 25 mg by mouth daily. 08/17/19   [provider]  isosorbide mononitrate (IMDUR) 60 MG 24 hr tablet Take 60 mg by mouth in the morning and at bedtime. 05/11/19   [provider]  lactobacillus acidophilus (BACID) TABS tablet Take 2 tablets by mouth 3 (three) times daily. Probiotic    [provider]  losartan (COZAAR) 100 MG tablet Take 100 mg by mouth daily.    [provider]  metFORMIN (GLUCOPHAGE) 500 MG tablet Take 500 mg by mouth 2 (two) times daily. 02/12/22   [provider]  Multiple Vitamin (QUINTABS) TABS Take 1 tablet by mouth daily.    [provider]  Multiple Vitamins-Minerals (MULTIPLE VITAMINS/WOMENS) tablet Take 1 tablet by mouth daily.    [provider]  nitroGLYCERIN (NITROSTAT) 0.4 MG SL tablet Place 1 tablet (0.4 mg total) under the tongue every 5 (five) minutes as needed for chest pain. 06/10/18   Clydie Braun, MD  Omega-3 Fatty Acids (FISH OIL) 1000 MG CAPS Take 1,000 mg by mouth daily.  [provider]  [facility] VERIO test strip 1 each 2 (two) times daily. 09/15/21   [provider]  Polyethyl Glycol-Propyl Glycol (SYSTANE OP) Place 1 drop into both eyes daily.    [provider]  potassium chloride (KLOR-CON M) 10 MEQ tablet Take by mouth. 03/21/22   [provider]  sodium chloride (OCEAN) 0.65 % SOLN nasal spray Place 1 spray into both nostrils as needed for congestion.    [provider]      Allergies    Amlodipine, Metoprolol, Prednisone, Doxycycline, and  Hydralazine    Review of Systems   Review of Systems  Gastrointestinal:  Positive for constipation.  All other systems reviewed and are negative.   Physical Exam Updated Vital Signs BP (!) 147/75 (BP Location: Left Arm)   Pulse 71   Temp 98.7 F (37.1 C) (Oral)   Resp 16   Wt 72 kg   LMP 05/16/2012   SpO2 100%   BMI 29.03 kg/m  Physical Exam Vitals and nursing note reviewed.  Constitutional:      Appearance: She is well-developed.  HENT:     Head: Normocephalic.     Mouth/Throat:     Mouth: Mucous membranes are moist.  Eyes:     Extraocular Movements: Extraocular movements intact.     Pupils: Pupils are equal, round, and reactive to light.  Cardiovascular:     Rate and Rhythm: Normal rate.  Pulmonary:     Effort: Pulmonary effort is normal.     Breath sounds: Normal breath sounds.  Abdominal:     General: Abdomen is flat. There is no distension.  Musculoskeletal:        General: Normal range of motion.     Cervical back: Normal range of motion.  Skin:    General: Skin is warm.  Neurological:     General: No focal deficit present.     Mental Status: She is alert and oriented to person, place, and time.  Psychiatric:        Mood and Affect: Mood normal.     ED Results / Procedures / Treatments   Labs (all labs ordered are listed, but only abnormal results are displayed) Labs Reviewed  CBG MONITORING, ED - Abnormal; Notable for the following components:      Result Value   Glucose-Capillary 101 (*)    All other components within normal limits    EKG None  Radiology No results found.  Procedures Procedures    Medications Ordered in ED Medications - No data to display  ED Course/ Medical Decision Making/ A&P                             Medical Decision Making Amount and/or Complexity of Data Reviewed Labs: ordered. Decision-making details documented in ED Course. Radiology: ordered and independent interpretation performed. Decision-making  details documented in ED Course.    Details: Ct abdomen pending    Patient has a rambling history with     Patient's care turned over to oncoming provider Claudette Stapler [facility]   Final Clinical Impression(s) / ED Diagnoses Final diagnoses:  Constipation, unspecified constipation type    Rx / DC Orders ED Discharge Orders     None         Osie Cheeks 08/27/22 1453    Melene Plan, DO 08/27/22 1454    Elson Areas, PA-C 08/27/22 1531    Melene Plan, DO  08/28/22 0656  

## 2023-02-24 ENCOUNTER — Other Ambulatory Visit: Payer: Self-pay | Admitting: Urology

## 2023-02-24 DIAGNOSIS — N182 Chronic kidney disease, stage 2 (mild): Secondary | ICD-10-CM

## 2023-03-14 ENCOUNTER — Other Ambulatory Visit: Payer: Medicare Other

## 2023-03-17 ENCOUNTER — Inpatient Hospital Stay
Admission: RE | Admit: 2023-03-17 | Discharge: 2023-03-17 | Discharge status: Home / Self Care | Payer: Medicare Other | Source: Ambulatory Visit | Attending: Urology

## 2023-03-17 DIAGNOSIS — N182 Chronic kidney disease, stage 2 (mild): Secondary | ICD-10-CM

## 2023-04-18 ENCOUNTER — Other Ambulatory Visit: Payer: Self-pay

## 2023-04-18 ENCOUNTER — Inpatient Hospital Stay (HOSPITAL_COMMUNITY)
Admission: EM | Admit: 2023-04-18 | Discharge: 2023-04-23 | DRG: 392 | Disposition: A | Discharge status: Home Health | Payer: Medicare Other | Attending: Student | Admitting: Student

## 2023-04-18 ENCOUNTER — Encounter (HOSPITAL_COMMUNITY): Payer: Self-pay

## 2023-04-18 DIAGNOSIS — Z87891 Personal history of nicotine dependence: Secondary | ICD-10-CM

## 2023-04-18 DIAGNOSIS — H9190 Unspecified hearing loss, unspecified ear: Secondary | ICD-10-CM | POA: Diagnosis present

## 2023-04-18 DIAGNOSIS — I10 Essential (primary) hypertension: Secondary | ICD-10-CM | POA: Diagnosis present

## 2023-04-18 DIAGNOSIS — Z825 Family history of asthma and other chronic lower respiratory diseases: Secondary | ICD-10-CM

## 2023-04-18 DIAGNOSIS — K529 Noninfective gastroenteritis and colitis, unspecified: Secondary | ICD-10-CM | POA: Diagnosis present

## 2023-04-18 DIAGNOSIS — E860 Dehydration: Secondary | ICD-10-CM | POA: Diagnosis not present

## 2023-04-18 DIAGNOSIS — R7989 Other specified abnormal findings of blood chemistry: Secondary | ICD-10-CM | POA: Diagnosis present

## 2023-04-18 DIAGNOSIS — Z7951 Long term (current) use of inhaled steroids: Secondary | ICD-10-CM

## 2023-04-18 DIAGNOSIS — R17 Unspecified jaundice: Secondary | ICD-10-CM | POA: Diagnosis present

## 2023-04-18 DIAGNOSIS — M797 Fibromyalgia: Secondary | ICD-10-CM | POA: Diagnosis present

## 2023-04-18 DIAGNOSIS — Z8542 Personal history of malignant neoplasm of other parts of uterus: Secondary | ICD-10-CM

## 2023-04-18 DIAGNOSIS — E7800 Pure hypercholesterolemia, unspecified: Secondary | ICD-10-CM | POA: Diagnosis present

## 2023-04-18 DIAGNOSIS — R1110 Vomiting, unspecified: Secondary | ICD-10-CM | POA: Diagnosis not present

## 2023-04-18 DIAGNOSIS — Z7982 Long term (current) use of aspirin: Secondary | ICD-10-CM

## 2023-04-18 DIAGNOSIS — Z79899 Other long term (current) drug therapy: Secondary | ICD-10-CM

## 2023-04-18 DIAGNOSIS — D446 Neoplasm of uncertain behavior of carotid body: Secondary | ICD-10-CM | POA: Diagnosis present

## 2023-04-18 DIAGNOSIS — Z833 Family history of diabetes mellitus: Secondary | ICD-10-CM

## 2023-04-18 DIAGNOSIS — Z888 Allergy status to other drugs, medicaments and biological substances status: Secondary | ICD-10-CM

## 2023-04-18 DIAGNOSIS — Z8249 Family history of ischemic heart disease and other diseases of the circulatory system: Secondary | ICD-10-CM

## 2023-04-18 DIAGNOSIS — D649 Anemia, unspecified: Secondary | ICD-10-CM | POA: Diagnosis present

## 2023-04-18 DIAGNOSIS — J45909 Unspecified asthma, uncomplicated: Secondary | ICD-10-CM | POA: Diagnosis present

## 2023-04-18 DIAGNOSIS — H5509 Other forms of nystagmus: Secondary | ICD-10-CM | POA: Diagnosis present

## 2023-04-18 DIAGNOSIS — Z881 Allergy status to other antibiotic agents status: Secondary | ICD-10-CM

## 2023-04-18 DIAGNOSIS — Z7984 Long term (current) use of oral hypoglycemic drugs: Secondary | ICD-10-CM

## 2023-04-18 DIAGNOSIS — R42 Dizziness and giddiness: Secondary | ICD-10-CM | POA: Diagnosis present

## 2023-04-18 DIAGNOSIS — A084 Viral intestinal infection, unspecified: Principal | ICD-10-CM | POA: Diagnosis present

## 2023-04-18 DIAGNOSIS — Z8673 Personal history of transient ischemic attack (TIA), and cerebral infarction without residual deficits: Secondary | ICD-10-CM

## 2023-04-18 DIAGNOSIS — E1165 Type 2 diabetes mellitus with hyperglycemia: Secondary | ICD-10-CM | POA: Diagnosis present

## 2023-04-18 DIAGNOSIS — E876 Hypokalemia: Secondary | ICD-10-CM | POA: Diagnosis present

## 2023-04-18 LAB — COMPREHENSIVE METABOLIC PANEL
ALT: 11 U/L (ref 0–44)
AST: 30 U/L (ref 15–41)
Albumin: 4.2 g/dL (ref 3.5–5.0)
Alkaline Phosphatase: 52 U/L (ref 38–126)
Anion gap: 10 (ref 5–15)
BUN: 18 mg/dL (ref 8–23)
CO2: 24 mmol/L (ref 22–32)
Calcium: 8.9 mg/dL (ref 8.9–10.3)
Chloride: 101 mmol/L (ref 98–111)
Creatinine, Ser: 0.95 mg/dL (ref 0.44–1.00)
GFR, Estimated: >60 mL/min (ref >60)
Glucose, Bld: 149 mg/dL — ABNORMAL HIGH (ref 70–99)
Potassium: 3.8 mmol/L (ref 3.5–5.1)
Sodium: 135 mmol/L (ref 135–145)
Total Bilirubin: 1.3 mg/dL — ABNORMAL HIGH (ref <1.2)
Total Protein: 7.5 g/dL (ref 6.5–8.1)

## 2023-04-18 LAB — CBC
HCT: 31.2 % — ABNORMAL LOW (ref 36.0–46.0)
Hemoglobin: 10.1 g/dL — ABNORMAL LOW (ref 12.0–15.0)
MCH: 31.1 pg (ref 26.0–34.0)
MCHC: 32.4 g/dL (ref 30.0–36.0)
MCV: 96 fL (ref 80.0–100.0)
Platelets: 218 10*3/uL (ref 150–400)
RBC: 3.25 MIL/uL — ABNORMAL LOW (ref 3.87–5.11)
RDW: 14.7 % (ref 11.5–15.5)
WBC: 5.8 10*3/uL (ref 4.0–10.5)
nRBC: 0 % (ref 0.0–0.2)

## 2023-04-18 LAB — LIPASE, BLOOD: Lipase: 43 U/L (ref 11–51)

## 2023-04-18 NOTE — ED Triage Notes (Signed)
BIBA from home c/o generalized weakness with n/v/d x12 hours.  NS and 4mg  Zofran with ems. Relief with Zofran.

## 2023-04-18 NOTE — ED Notes (Signed)
Triage RN aware of PT low pulse

## 2023-04-19 ENCOUNTER — Emergency Department (HOSPITAL_COMMUNITY): Payer: Medicare Other

## 2023-04-19 DIAGNOSIS — K529 Noninfective gastroenteritis and colitis, unspecified: Secondary | ICD-10-CM | POA: Diagnosis present

## 2023-04-19 DIAGNOSIS — R1110 Vomiting, unspecified: Secondary | ICD-10-CM | POA: Diagnosis not present

## 2023-04-19 LAB — HIV ANTIBODY (ROUTINE TESTING W REFLEX): HIV Screen 4th Generation wRfx: NONREACTIVE

## 2023-04-19 LAB — TROPONIN I (HIGH SENSITIVITY): Troponin I (High Sensitivity): 4 ng/L (ref <18)

## 2023-04-19 LAB — GLUCOSE, CAPILLARY
Glucose-Capillary: 90 mg/dL (ref 70–99)
Glucose-Capillary: 90 mg/dL (ref 70–99)

## 2023-04-19 MED ORDER — SODIUM CHLORIDE 0.9 % IV BOLUS (SEPSIS)
1000.0000 mL | Freq: Once | INTRAVENOUS | Status: AC
  Administered 2023-04-19: 1000 mL via INTRAVENOUS

## 2023-04-19 MED ORDER — ONDANSETRON HCL 4 MG PO TABS: 4.0000 mg | ORAL_TABLET | Freq: Four times a day (QID) | ORAL | Status: DC | PRN

## 2023-04-19 MED ORDER — ONDANSETRON HCL 4 MG/2ML IJ SOLN
4.0000 mg | Freq: Once | INTRAMUSCULAR | Status: AC
Start: 2023-04-19 — End: 2023-04-19
  Administered 2023-04-19: 4 mg via INTRAVENOUS
  Filled 2023-04-19: qty 2

## 2023-04-19 MED ORDER — MOMETASONE FURO-FORMOTEROL FUM 200-5 MCG/ACT IN AERO
2.0000 | INHALATION_SPRAY | Freq: Two times a day (BID) | RESPIRATORY_TRACT | Status: DC
  Administered 2023-04-19 – 2023-04-23 (×8): 2 via RESPIRATORY_TRACT
  Filled 2023-04-19: qty 8.8

## 2023-04-19 MED ORDER — ATORVASTATIN CALCIUM 40 MG PO TABS
80.0000 mg | ORAL_TABLET | Freq: Every evening | ORAL | Status: DC
  Administered 2023-04-19 – 2023-04-22 (×4): 80 mg via ORAL
  Filled 2023-04-19 (×4): qty 2

## 2023-04-19 MED ORDER — PIPERACILLIN-TAZOBACTAM 3.375 G IVPB
3.3750 g | Freq: Three times a day (TID) | INTRAVENOUS | Status: DC
  Administered 2023-04-19 – 2023-04-21 (×6): 3.375 g via INTRAVENOUS
  Filled 2023-04-19 (×6): qty 50

## 2023-04-19 MED ORDER — ONDANSETRON HCL 4 MG/2ML IJ SOLN
4.0000 mg | Freq: Once | INTRAMUSCULAR | Status: AC
  Administered 2023-04-19: 4 mg via INTRAVENOUS
  Filled 2023-04-19: qty 2

## 2023-04-19 MED ORDER — HYDROCHLOROTHIAZIDE 25 MG PO TABS
25.0000 mg | ORAL_TABLET | Freq: Every day | ORAL | Status: DC
  Administered 2023-04-19 – 2023-04-20 (×2): 25 mg via ORAL
  Filled 2023-04-19 (×2): qty 1

## 2023-04-19 MED ORDER — LOSARTAN POTASSIUM 50 MG PO TABS
100.0000 mg | ORAL_TABLET | Freq: Every day | ORAL | Status: DC
  Administered 2023-04-19 – 2023-04-20 (×2): 100 mg via ORAL
  Filled 2023-04-19 (×2): qty 2

## 2023-04-19 MED ORDER — ASPIRIN 325 MG PO TBEC
325.0000 mg | DELAYED_RELEASE_TABLET | Freq: Every day | ORAL | Status: DC
  Administered 2023-04-19 – 2023-04-23 (×5): 325 mg via ORAL
  Filled 2023-04-19 (×5): qty 1

## 2023-04-19 MED ORDER — ACETAMINOPHEN 650 MG RE SUPP: 650.0000 mg | Freq: Four times a day (QID) | RECTAL | Status: DC | PRN

## 2023-04-19 MED ORDER — IOHEXOL 300 MG/ML  SOLN
100.0000 mL | Freq: Once | INTRAMUSCULAR | Status: AC | PRN
Start: 2023-04-19 — End: 2023-04-19
  Administered 2023-04-19: 100 mL via INTRAVENOUS

## 2023-04-19 MED ORDER — ACETAMINOPHEN 325 MG PO TABS: 650.0000 mg | ORAL_TABLET | Freq: Four times a day (QID) | ORAL | Status: DC | PRN

## 2023-04-19 MED ORDER — SODIUM CHLORIDE 0.9 % IV SOLN: INTRAVENOUS | Status: DC

## 2023-04-19 MED ORDER — ONDANSETRON HCL 4 MG/2ML IJ SOLN
4.0000 mg | Freq: Four times a day (QID) | INTRAMUSCULAR | Status: DC | PRN
  Administered 2023-04-19 – 2023-04-20 (×2): 4 mg via INTRAVENOUS
  Filled 2023-04-19 (×3): qty 2

## 2023-04-19 MED ORDER — ALBUTEROL SULFATE (2.5 MG/3ML) 0.083% IN NEBU: 2.5000 mg | INHALATION_SOLUTION | Freq: Four times a day (QID) | RESPIRATORY_TRACT | Status: DC | PRN

## 2023-04-19 MED ORDER — FENTANYL CITRATE PF 50 MCG/ML IJ SOSY
50.0000 ug | PREFILLED_SYRINGE | Freq: Once | INTRAMUSCULAR | Status: AC
  Administered 2023-04-19: 50 ug via INTRAVENOUS
  Filled 2023-04-19: qty 1

## 2023-04-19 MED ORDER — INSULIN ASPART 100 UNIT/ML IJ SOLN
0.0000 [IU] | Freq: Three times a day (TID) | INTRAMUSCULAR | Status: DC
  Administered 2023-04-20: 3 [IU] via SUBCUTANEOUS
  Administered 2023-04-22: 5 [IU] via SUBCUTANEOUS
  Administered 2023-04-22: 2 [IU] via SUBCUTANEOUS

## 2023-04-19 MED ORDER — SENNOSIDES-DOCUSATE SODIUM 8.6-50 MG PO TABS: 1.0000 | ORAL_TABLET | Freq: Every evening | ORAL | Status: DC | PRN

## 2023-04-19 MED ORDER — INSULIN ASPART 100 UNIT/ML IJ SOLN: 0.0000 [IU] | Freq: Every day | INTRAMUSCULAR | Status: DC

## 2023-04-19 MED ORDER — ENOXAPARIN SODIUM 40 MG/0.4ML IJ SOSY
40.0000 mg | PREFILLED_SYRINGE | INTRAMUSCULAR | Status: DC
  Administered 2023-04-19 – 2023-04-23 (×5): 40 mg via SUBCUTANEOUS
  Filled 2023-04-19 (×5): qty 0.4

## 2023-04-19 NOTE — ED Provider Notes (Signed)
  Physical Exam  BP (!) 143/71   Pulse 61   Temp 98.2 F (36.8 C) (Oral)   Resp 10   Wt 72 kg   LMP 05/16/2012   SpO2 99%   BMI 29.03 kg/m   Physical Exam  Procedures  Procedures  ED Course / MDM   Clinical Course as of 04/19/23 0714  Sat Apr 19, 2023  0444 Patient still having vomiting despite medications, will continue to monitor [DW]  [phone] Patient was difficult to examine because every time she laid on her back she started retching and vomiting.  She reported generalized abdominal pain.  Due to persistent vomiting and now having abdominal pain, CT imaging was ordered [DW]  0657 I went in to reassess patient after CT scan.  She confirms a story of abrupt onset of vomiting diarrhea and abdominal pain.  She also reports feeling dizziness.  When I began to reexamine patient she started having nausea and retching.  Patient will need to be admitted to the hospital [DW]  0712 Signed out to dr Rhunette Croft at shift change [DW]    Clinical Course User Index [DW] Zadie Rhine, MD   Medical Decision Making Amount and/or Complexity of Data Reviewed Labs: ordered. Radiology: ordered. ECG/medicine tests: ordered.  Risk Prescription drug management. Decision regarding hospitalization.   Pt with cc of generalized weakness and severe n/v/d. Hx of DM and stroke.  Trops x 2 neg. No neuro complaints.  CT ordered, neg.  Received antiemetics, but continues to have n/v. Admit request in.       Derwood Kaplan, MD 04/19/23 [phone]

## 2023-04-19 NOTE — ED Notes (Signed)
ED TO INPATIENT HANDOFF REPORT  ED Nurse Name and Phone #:  Lucile Hillmann [phone]  S Name/Age/Gender Krystal Vargas 67 y.o. female Room/Bed: WA07/WA07  Code Status   Code Status: Full Code  Home/SNF/Other Home Patient oriented to: self, place, time, and situation Is this baseline? Yes   Triage Complete: Triage complete  Chief Complaint Colitis [K52.9]  Triage Note BIBA from home c/o generalized weakness with n/v/d x12 hours.  NS and 4mg  Zofran with ems. Relief with Zofran.    Allergies Allergies  Allergen Reactions   Amlodipine Other (See Comments)    Other reaction(s): Other (See Comments)  sob  sob, sob   Metoprolol Other (See Comments)    sob  sob, sob   Prednisone Other (See Comments)    High blood sugar  High blood sugar, High blood sugar   Doxycycline     Chest Pain    Hydralazine Other (See Comments)    Pt states she does not function on this    Level of Care/Admitting Diagnosis ED Disposition     ED Disposition  Admit   Condition  --   Comment  Hospital Area: [facility] [[id number]]  Level of Care: Med-Surg [16]  May place patient in observation at [facility] or [facility] if equivalent level of care is available:: No  Covid Evaluation: Asymptomatic - no recent exposure (last 10 days) testing not required  Diagnosis: Colitis [409811]  Admitting Physician: Steffanie Rainwater [[id number]]  Attending Physician: Steffanie Rainwater [[id number]]          B Medical/Surgery History Past Medical History:  Diagnosis Date   Asthma    Cancer (HCC)    Diabetes mellitus    Fibromyalgia    High cholesterol    Hypertension    Stroke [facility])    Past Surgical History:  Procedure Laterality Date   TUBAL LIGATION       A IV Location/Drains/Wounds Patient Lines/Drains/Airways Status     Active Line/Drains/Airways     Name Placement date Placement time Site Days   Peripheral IV 04/19/23 18 G Right Antecubital 04/19/23  0200   Antecubital  less than 1            Intake/Output Last 24 hours No intake or output data in the 24 hours ending 04/19/23 1122  Labs/Imaging Results for orders placed or performed during the hospital encounter of 04/18/23 (from the past 48 hour(s))  Lipase, blood     Status: None   Collection Time: 04/18/23 10:24 PM  Result Value Ref Range   Lipase 43 11 - 51 U/L    Comment: Performed at [facility], [address], [place], [place] [address]  Comprehensive metabolic panel     Status: Abnormal   Collection Time: 04/18/23 10:24 PM  Result Value Ref Range   Sodium 135 135 - 145 mmol/L   Potassium 3.8 3.5 - 5.1 mmol/L   Chloride 101 98 - 111 mmol/L   CO2 24 22 - 32 mmol/L   Glucose, Bld 149 (H) 70 - 99 mg/dL    Comment: Glucose reference range applies only to samples taken after fasting for at least 8 hours.   BUN 18 8 - 23 mg/dL   Creatinine, Ser 6.96 0.44 - 1.00 mg/dL   Calcium 8.9 8.9 - 29.5 mg/dL   Total Protein 7.5 6.5 - 8.1 g/dL   Albumin 4.2 3.5 - 5.0 g/dL   AST 30 15 - 41 U/L   ALT  11 0 - 44 U/L   Alkaline Phosphatase 52 38 - 126 U/L   Total Bilirubin 1.3 (H) <1.2 mg/dL   GFR, Estimated >57 >84 mL/min    Comment: (NOTE) Calculated using the CKD-EPI Creatinine Equation (2021)    Anion gap 10 5 - 15    Comment: Performed at [facility], [address], [place], [place] [address]  CBC     Status: Abnormal   Collection Time: 04/18/23 10:24 PM  Result Value Ref Range   WBC 5.8 4.0 - 10.5 K/uL   RBC 3.25 (L) 3.87 - 5.11 MIL/uL   Hemoglobin 10.1 (L) 12.0 - 15.0 g/dL   HCT 52.8 (L) 41.3 - 24.4 %   MCV 96.0 80.0 - 100.0 fL   MCH 31.1 26.0 - 34.0 pg   MCHC 32.4 30.0 - 36.0 g/dL   RDW 01.0 27.2 - 53.6 %   Platelets 218 150 - 400 K/uL   nRBC 0.0 0.0 - 0.2 %    Comment: Performed at [facility], [address], [place], [place] [address]  Troponin I (High Sensitivity)     Status: None   Collection Time:  04/19/23  4:44 AM  Result Value Ref Range   Troponin I (High Sensitivity) 4 <18 ng/L    Comment: (NOTE) Elevated high sensitivity troponin I (hsTnI) values and significant  changes across serial measurements may suggest ACS but many other  chronic and acute conditions are known to elevate hsTnI results.  Refer to the "Links" section for chest pain algorithms and additional  guidance. Performed at [facility], [address] [place], [place] [address]    CT ABDOMEN PELVIS W CONTRAST  Result Date: 04/19/2023 CLINICAL DATA:  67 year old female with abdominal pain. EXAM: CT ABDOMEN AND PELVIS WITH CONTRAST TECHNIQUE: Multidetector CT imaging of the abdomen and pelvis was performed using the standard protocol following bolus administration of intravenous contrast. RADIATION DOSE REDUCTION: This exam was performed according to the departmental dose-optimization program which includes automated exposure control, adjustment of the mA and/or kV according to patient size and/or use of iterative reconstruction technique. CONTRAST:  OMNIPAQUE IOHEXOL 300 MG/ML  SOLN COMPARISON:  CT Abdomen and Pelvis 08/27/2022. FINDINGS: Lower chest: Lower lung volumes. Minor lung base atelectasis. No pericardial or pleural effusion. Hepatobiliary: Posterior right hepatic dome hemangioma is stable from 03/23/2022 CT (no follow-up imaging recommended). Negative gallbladder. Pancreas: Negative. Spleen: Negative. Adrenals/Urinary Tract: Normal adrenal glands. Nonobstructed kidneys with symmetric renal enhancement, contrast excretion. Decompressed ureters. Unremarkable bladder. Numerous pelvic phleboliths. Stomach/Bowel: Mostly decompressed rectosigmoid colon in the pelvis. Redundant sigmoid in the left lower quadrant partially contains gas. Diverticulosis throughout the descending colon and at the splenic flexure. Those segments are decompressed. No convincing inflammation. Similar decompressed appearance  of the transverse colon. Diverticulosis at the hepatic flexure. Decompressed right colon. Elongated but normal appendix on coronal image 80 just below the liver margin, tracking cephalad from the cecum which is decompressed. Decompressed terminal ileum. No dilated small bowel. Decompressed stomach and duodenum. No free air. No free fluid or mesenteric inflammation identified in the abdomen. Small fat containing umbilical hernia. No herniated bowel. Vascular/Lymphatic: Aortoiliac calcified atherosclerosis. Normal caliber abdominal aorta. Major arterial structures remain patent. Grossly patent portal venous system. No lymphadenopathy identified. Reproductive: Surgically absent uterus. Diminutive or absent ovaries. Numerous pelvic phleboliths. Other: Small volume of simple density pelvis free fluid on series 2, image 59. Musculoskeletal: Advanced lumbar facet arthropathy. No acute osseous abnormality identified.  Levoconvex scoliosis today appears in part to be positional artifact. IMPRESSION: 1. Small volume of simple free fluid in the pelvis is abnormal in this age group but nonspecific. Normal appendix. The colon is redundant but mostly decompressed. Mild colitis would be difficult to exclude. No evidence of bowel obstruction. Intermittent large bowel diverticulosis but no focal mesenteric inflammation identified. 2.  Aortic Atherosclerosis (ICD10-I70.0). Electronically Signed   By: Odessa Fleming M.D.   On: 04/19/2023 06:57    Pending Labs Unresulted Labs (From admission, onward)     Start     Ordered   04/19/23 0919  HIV Antibody (routine testing w rflx)  (HIV Antibody (Routine testing w reflex) panel)  Once,   R        04/19/23 0920            Vitals/Pain Today's Vitals   04/19/23 0619 04/19/23 0619 04/19/23 0823 04/19/23 1105  BP:      Pulse: 61     Resp: 10     Temp:  98.2 F (36.8 C)  98.3 F (36.8 C)  TempSrc:  Oral  Oral  SpO2: 99%     Weight:   158 lb (71.7 kg)   Height:   5\' 2"  (1.575 m)    PainSc:        Isolation Precautions No active isolations  Medications Medications  enoxaparin (LOVENOX) injection 40 mg (has no administration in time range)  acetaminophen (TYLENOL) tablet 650 mg (has no administration in time range)    Or  acetaminophen (TYLENOL) suppository 650 mg (has no administration in time range)  senna-docusate (Senokot-S) tablet 1 tablet (has no administration in time range)  ondansetron (ZOFRAN) tablet 4 mg (has no administration in time range)    Or  ondansetron (ZOFRAN) injection 4 mg (has no administration in time range)  0.9 %  sodium chloride infusion ( Intravenous New Bag/Given 04/19/23 0957)  ondansetron (ZOFRAN) injection 4 mg (4 mg Intravenous Given 04/19/23 0234)  sodium chloride 0.9 % bolus 1,000 mL (0 mLs Intravenous Stopped 04/19/23 0708)  ondansetron (ZOFRAN) injection 4 mg (4 mg Intravenous Given 04/19/23 0437)  fentaNYL (SUBLIMAZE) injection 50 mcg (50 mcg Intravenous Given 04/19/23 0436)  iohexol (OMNIPAQUE) 300 MG/ML solution 100 mL (100 mLs Intravenous Contrast Given 04/19/23 0534)  ondansetron (ZOFRAN) injection 4 mg (4 mg Intravenous Given 04/19/23 0956)    Mobility walks with device     Focused Assessments    R Recommendations: See Admitting Provider Note  Report given to:   Additional Notes:

## 2023-04-19 NOTE — ED Provider Notes (Signed)
[place] EMERGENCY DEPARTMENT AT [facility] Provider Note   CSN: [id number] Arrival date & time: 04/18/23  2120     History  Chief Complaint  Patient presents with   Emesis    Krystal Vargas is a 67 y.o. female.  The history is provided by the patient.  Patient with history of diabetes, CVA, hypertension, hyperlipidemia, obesity, fibromyalgia presents with vomiting and diarrhea.  Patient reports several hours ago she had abrupt onset of nausea vomiting diarrhea.  It is not reported as bloody.  She also reports dizziness and abdominal pain. Patient had some relief with Zofran prior to arrival    Past Medical History:  Diagnosis Date   Asthma    Cancer (HCC)    Diabetes mellitus    Fibromyalgia    High cholesterol    Hypertension    Stroke [facility])     Home Medications Prior to Admission medications   Medication Sig Start Date End Date Taking? Authorizing Provider  acetaminophen (TYLENOL 8 HOUR) 650 MG CR tablet Take 1 tablet (650 mg total) by mouth every 8 (eight) hours as needed for pain. 08/27/22   Mannie Stabile, PA-C  albuterol (PROVENTIL) (2.5 MG/3ML) 0.083% nebulizer solution 3 mLs (2.5 mg total) every 4 (four) hours as needed. 05/08/17   [provider]  albuterol (VENTOLIN HFA) 108 (90 Base) MCG/ACT inhaler Inhale 2 puffs into the lungs every 6 (six) hours as needed for wheezing or shortness of breath. 10/01/21   [provider]  aspirin EC 325 MG tablet Take 1 tablet (325 mg total) by mouth daily. 02/16/22   Rai, Delene Ruffini, MD  atorvastatin (LIPITOR) 80 MG tablet Take 1 tablet (80 mg total) by mouth every evening. 02/16/22   Rai, Delene Ruffini, MD  Biotin 5 MG TBDP Take 1 tablet by mouth daily.    [provider]  fluticasone-salmeterol (ADVAIR) 250-50 MCG/ACT AEPB Inhale 1 puff into the lungs as needed (for wheezing and shortness of breath).    [provider]  fluticasone-salmeterol (WIXELA INHUB) 250-50 MCG/ACT AEPB  Inhale 1 puff into the lungs as needed (for wheezing and shortness of breath). 08/06/21   [provider]  glucose blood (ONETOUCH VERIO) test strip USE AS DIRECTED TO CHECK  BLOOD SUGAR TWICE DAILY 06/29/21   [provider]  hydrALAZINE (APRESOLINE) 100 MG tablet Take 100 mg by mouth 3 (three) times daily. 09/28/21   [provider]  hydrochlorothiazide (HYDRODIURIL) 25 MG tablet Take 25 mg by mouth daily. 08/17/19   [provider]  isosorbide mononitrate (IMDUR) 60 MG 24 hr tablet Take 60 mg by mouth in the morning and at bedtime. 05/11/19   [provider]  lactobacillus acidophilus (BACID) TABS tablet Take 2 tablets by mouth 3 (three) times daily. Probiotic    [provider]  losartan (COZAAR) 100 MG tablet Take 100 mg by mouth daily.    [provider]  metFORMIN (GLUCOPHAGE) 500 MG tablet Take 500 mg by mouth 2 (two) times daily. 02/12/22   [provider]  Multiple Vitamin (QUINTABS) TABS Take 1 tablet by mouth daily.    [provider]  Multiple Vitamins-Minerals (MULTIPLE VITAMINS/WOMENS) tablet Take 1 tablet by mouth daily.    [provider]  nitroGLYCERIN (NITROSTAT) 0.4 MG SL tablet Place 1 tablet (0.4 mg total) under the tongue every 5 (five) minutes as needed for chest pain. 06/10/18   Clydie Braun, MD  Omega-3 Fatty Acids (FISH OIL) 1000 MG CAPS  Take 1,000 mg by mouth daily.    [provider]  [facility] VERIO test strip 1 each 2 (two) times daily. 09/15/21   [provider]  Polyethyl Glycol-Propyl Glycol (SYSTANE OP) Place 1 drop into both eyes daily.    [provider]  potassium chloride (KLOR-CON M) 10 MEQ tablet Take by mouth. 03/21/22   [provider]  sodium chloride (OCEAN) 0.65 % SOLN nasal spray Place 1 spray into both nostrils as needed for congestion.    [provider]      Allergies    Amlodipine, Metoprolol, Prednisone, Doxycycline, and  Hydralazine    Review of Systems   Review of Systems  Constitutional:  Negative for fever.  Gastrointestinal:  Positive for abdominal pain, diarrhea and vomiting.    Physical Exam Updated Vital Signs BP (!) 143/71   Pulse 61   Temp 98.2 F (36.8 C) (Oral)   Resp 10   Wt 72 kg   LMP 05/16/2012   SpO2 99%   BMI 29.03 kg/m  Physical Exam CONSTITUTIONAL: Ill-appearing, actively vomiting when I enter the room HEAD: Normocephalic/atraumatic Eyes- no nystagmus, no icterus LUNGS: no apparent distress ABDOMEN: soft, obese, diffuse mild tenderness NEURO: Pt is awake/alert/appropriate, moves all extremitiesx4.  No facial droop.  No arm or leg drift is noted EXTREMITIES: pulses normal/equal, full ROM SKIN: warm, color normal   ED Results / Procedures / Treatments   Labs (all labs ordered are listed, but only abnormal results are displayed) Labs Reviewed  COMPREHENSIVE METABOLIC PANEL - Abnormal; Notable for the following components:      Result Value   Glucose, Bld 149 (*)    Total Bilirubin 1.3 (*)    All other components within normal limits  CBC - Abnormal; Notable for the following components:   RBC 3.25 (*)    Hemoglobin 10.1 (*)    HCT 31.2 (*)    All other components within normal limits  LIPASE, BLOOD  TROPONIN I (HIGH SENSITIVITY)    EKG EKG Interpretation Date/Time:  Saturday April 19 2023 02:38:44 EST Ventricular Rate:  60 PR Interval:  194 QRS Duration:  100 QT Interval:  491 QTC Calculation: 491 R Axis:   91  Text Interpretation: Right and left arm electrode reversal, interpretation assumes no reversal Sinus rhythm Right axis deviation No significant change since last tracing Confirmed by Zadie Rhine ([id number]) on 04/19/2023 2:53:44 AM  Radiology CT ABDOMEN PELVIS W CONTRAST  Result Date: 04/19/2023 CLINICAL DATA:  67 year old female with abdominal pain. EXAM: CT ABDOMEN AND PELVIS WITH CONTRAST TECHNIQUE: Multidetector CT imaging of the abdomen  and pelvis was performed using the standard protocol following bolus administration of intravenous contrast. RADIATION DOSE REDUCTION: This exam was performed according to the departmental dose-optimization program which includes automated exposure control, adjustment of the mA and/or kV according to patient size and/or use of iterative reconstruction technique. CONTRAST:  OMNIPAQUE IOHEXOL 300 MG/ML  SOLN COMPARISON:  CT Abdomen and Pelvis 08/27/2022. FINDINGS: Lower chest: Lower lung volumes. Minor lung base atelectasis. No pericardial or pleural effusion. Hepatobiliary: Posterior right hepatic dome hemangioma is stable from 03/23/2022 CT (no follow-up imaging recommended). Negative gallbladder. Pancreas: Negative. Spleen: Negative. Adrenals/Urinary Tract: Normal adrenal glands. Nonobstructed kidneys with symmetric renal enhancement, contrast excretion. Decompressed ureters. Unremarkable bladder. Numerous pelvic phleboliths. Stomach/Bowel: Mostly decompressed rectosigmoid colon in the pelvis. Redundant sigmoid in the left lower quadrant partially contains gas. Diverticulosis throughout the descending colon and at the splenic flexure. Those segments are  decompressed. No convincing inflammation. Similar decompressed appearance of the transverse colon. Diverticulosis at the hepatic flexure. Decompressed right colon. Elongated but normal appendix on coronal image 80 just below the liver margin, tracking cephalad from the cecum which is decompressed. Decompressed terminal ileum. No dilated small bowel. Decompressed stomach and duodenum. No free air. No free fluid or mesenteric inflammation identified in the abdomen. Small fat containing umbilical hernia. No herniated bowel. Vascular/Lymphatic: Aortoiliac calcified atherosclerosis. Normal caliber abdominal aorta. Major arterial structures remain patent. Grossly patent portal venous system. No lymphadenopathy identified. Reproductive: Surgically absent uterus.  Diminutive or absent ovaries. Numerous pelvic phleboliths. Other: Small volume of simple density pelvis free fluid on series 2, image 59. Musculoskeletal: Advanced lumbar facet arthropathy. No acute osseous abnormality identified. Levoconvex scoliosis today appears in part to be positional artifact. IMPRESSION: 1. Small volume of simple free fluid in the pelvis is abnormal in this age group but nonspecific. Normal appendix. The colon is redundant but mostly decompressed. Mild colitis would be difficult to exclude. No evidence of bowel obstruction. Intermittent large bowel diverticulosis but no focal mesenteric inflammation identified. 2.  Aortic Atherosclerosis (ICD10-I70.0). Electronically Signed   By: Odessa Fleming M.D.   On: 04/19/2023 06:57    Procedures Procedures    Medications Ordered in ED Medications  ondansetron (ZOFRAN) injection 4 mg (4 mg Intravenous Given 04/19/23 0234)  sodium chloride 0.9 % bolus 1,000 mL (0 mLs Intravenous Stopped 04/19/23 0708)  ondansetron (ZOFRAN) injection 4 mg (4 mg Intravenous Given 04/19/23 0437)  fentaNYL (SUBLIMAZE) injection 50 mcg (50 mcg Intravenous Given 04/19/23 0436)  iohexol (OMNIPAQUE) 300 MG/ML solution 100 mL (100 mLs Intravenous Contrast Given 04/19/23 0534)    ED Course/ Medical Decision Making/ A&P Clinical Course as of 04/19/23 1610  Sat Apr 19, 2023  0444 Patient still having vomiting despite medications, will continue to monitor [DW]  [phone] Patient was difficult to examine because every time she laid on her back she started retching and vomiting.  She reported generalized abdominal pain.  Due to persistent vomiting and now having abdominal pain, CT imaging was ordered [DW]  0657 I went in to reassess patient after CT scan.  She confirms a story of abrupt onset of vomiting diarrhea and abdominal pain.  She also reports feeling dizziness.  When I began to reexamine patient she started having nausea and retching.  Patient will need to be admitted to the  hospital [DW]  0712 Signed out to dr Rhunette Croft at shift change [DW]    Clinical Course User Index [DW] Zadie Rhine, MD                                 Medical Decision Making Amount and/or Complexity of Data Reviewed Labs: ordered. Radiology: ordered. ECG/medicine tests: ordered.  Risk Prescription drug management. Decision regarding hospitalization.   This patient presents to the ED for concern of abdominal pain with vomiting and diarrhea, this involves an extensive number of treatment options, and is a complaint that carries with it a high risk of complications and morbidity.  The differential diagnosis includes but is not limited to cholecystitis, cholelithiasis, pancreatitis, gastritis, peptic ulcer disease, appendicitis, bowel obstruction, bowel perforation, diverticulitis, AAA, ischemic bowel    Comorbidities that complicate the patient evaluation: Patient's presentation is complicated by their history of obesity and diabetes  Social Determinants of Health: Patient's  stressors   increases the complexity of managing their presentation  Additional history  obtained: Records reviewed  outpatient records reviewed  Lab Tests: I Ordered, and personally interpreted labs.  The pertinent results include: Mild hyperglycemia, anemia  Imaging Studies ordered: I ordered imaging studies including CT scan abdomen pelvis   I independently visualized and interpreted imaging which showed no acute findings I agree with the radiologist interpretation  Cardiac Monitoring: The patient was maintained on a cardiac monitor.  I personally viewed and interpreted the cardiac monitor which showed an underlying rhythm of:  sinus rhythm  Medicines ordered and prescription drug management: I ordered medication including Zofran for nausea Reevaluation of the patient after these medicines showed that the patient    stayed the same   Reevaluation: After the interventions noted above, I  reevaluated the patient and found that they have :worsened  Complexity of problems addressed: Patient's presentation is most consistent with  acute presentation with potential threat to life or bodily function  Disposition: After consideration of the diagnostic results and the patient's response to treatment,  I feel that the patent would benefit from admission   .           Final Clinical Impression(s) / ED Diagnoses Final diagnoses:  Uncontrollable vomiting    Rx / DC Orders ED Discharge Orders     None         Zadie Rhine, MD 04/19/23 [phone]

## 2023-04-19 NOTE — Progress Notes (Signed)
Pharmacy Antibiotic Note  Krystal Vargas is a 67 y.o. female admitted on 04/18/2023 with Intractable N/V, possible colitis.  Pharmacy has been consulted for Zosyn dosing for intra-abdominal infection.  SCr 0.95, WBC WNL, Afebrile CT: Small volume of simple free fluid in the pelvis is abnormal in this age group but nonspecific. Normal appendix. Mild colitis would be difficult to exclude.   Plan: Zosyn 3.375g IV Q8H infused over 4hrs.  Follow up renal function, culture results, and clinical course.    Height: 5\' 2"  (157.5 cm) Weight: 71.7 kg (158 lb) IBW/kg (Calculated) : 50.1  Temp (24hrs), Avg:98 F (36.7 C), Min:97.5 F (36.4 C), Max:98.3 F (36.8 C)  Recent Labs  Lab 04/18/23 2224  WBC 5.8  CREATININE 0.95    Estimated Creatinine Clearance: 52.5 mL/min (by C-G formula based on SCr of 0.95 mg/dL).    Allergies  Allergen Reactions   Amlodipine Other (See Comments)    Other reaction(s): Other (See Comments)  sob  sob, sob   Metoprolol Other (See Comments)    sob  sob, sob   Prednisone Other (See Comments)    High blood sugar  High blood sugar, High blood sugar   Doxycycline     Chest Pain    Hydralazine Other (See Comments)    Pt states she does not function on this    Antimicrobials this admission: 12/7 Zosyn >>   Microbiology results: GI panel:   Thank you for allowing pharmacy to be a part of this patient's care.  Lynann Beaver PharmD, BCPS [facility] main pharmacy [phone] 04/19/2023 12:11 PM

## 2023-04-19 NOTE — Plan of Care (Signed)
  Problem: Education: Goal: Knowledge of General Education information will improve Description: Including pain rating scale, medication(s)/side effects and non-pharmacologic comfort measures Outcome: Progressing   Problem: Activity: Goal: Risk for activity intolerance will decrease Outcome: Progressing   Problem: Nutrition: Goal: Adequate nutrition will be maintained Outcome: Progressing   

## 2023-04-19 NOTE — H&P (Signed)
History and Physical    Patient: Krystal Vargas ZOX:[id number] DOB: March 26, 1956 DOA: 04/18/2023 DOS: the patient was seen and examined on 04/19/2023 PCP: Patient, No Pcp Per  Patient coming from: Home  Chief Complaint:  Chief Complaint  Patient presents with   Emesis   HPI: Krystal Vargas is a 67 y.o. female with medical history significant of asthma, CVA, HTN, HLD, T2DM, bradycardia, fibromyalgia and endometrial cancer presented to the ED for evaluation of intractable nausea and vomiting. She reports that yesterday afternoon, she started having nausea and vomiting and it persisted throughout the day. She was unable to keep any food down and has been vomiting since being in the ED. She also reports having watery stools 3 times yesterday. She endorses associated chills, lightheadedness, achy abdominal pain but denies any fevers, bloody stools, shortness of breath, headaches, hematemesis or hematuria. She denies any recent new foods or recent antibiotics use.  ED course: Initial vitals with temp 97.8, RR 14, HR 48, BP 138/65, and SpO2 98% on room air. Labs show sodium 135, K+ 3.8, glucose 149, creatinine 0.95, AST/ALT 30/11, alk phos 52, bilirubin 1.3, WBC 5.8, Hgb 10.1, platelet 218, lipase 43, troponin 4 CT A/P shows possible mild colitis and large diverticulosis but no bowel obstruction or diverticulitis. EKG shows sinus rhythm with HR of 60 Patient received multiple rounds of IV Zofran and 1L IV NS bolus x1 TR[facility] was consulted for admission  Review of Systems: As mentioned in the history of present illness. All other systems reviewed and are negative. Past Medical History:  Diagnosis Date   Asthma    Cancer (HCC)    Diabetes mellitus    Fibromyalgia    High cholesterol    Hypertension    Stroke [facility])    Past Surgical History:  Procedure Laterality Date   TUBAL LIGATION     Social History:  reports that she has quit smoking. She has never used smokeless tobacco. She reports current alcohol  use. She reports that she does not use drugs.  Allergies  Allergen Reactions   Amlodipine Other (See Comments)    Other reaction(s): Other (See Comments)  sob  sob, sob   Metoprolol Other (See Comments)    sob  sob, sob   Prednisone Other (See Comments)    High blood sugar  High blood sugar, High blood sugar   Doxycycline     Chest Pain    Hydralazine Other (See Comments)    Pt states she does not function on this    Family History  Problem Relation Age of Onset   Hypertension Mother    Diabetes Mellitus II Mother    Bronchitis Father    Hypertension Sister    Diabetes Mellitus II Sister    Hypertension Brother    Diabetes Mellitus II Brother     Prior to Admission medications   Medication Sig Start Date End Date Taking? Authorizing Provider  acetaminophen (TYLENOL 8 HOUR) 650 MG CR tablet Take 1 tablet (650 mg total) by mouth every 8 (eight) hours as needed for pain. 08/27/22   Mannie Stabile, PA-C  albuterol (PROVENTIL) (2.5 MG/3ML) 0.083% nebulizer solution 3 mLs (2.5 mg total) every 4 (four) hours as needed. 05/08/17   [provider]  albuterol (VENTOLIN HFA) 108 (90 Base) MCG/ACT inhaler Inhale 2 puffs into the lungs every 6 (six) hours as needed for wheezing or shortness of breath. 10/01/21   [provider]  aspirin EC 325 MG tablet Take 1 tablet (325  mg total) by mouth daily. 02/16/22   Rai, Delene Ruffini, MD  atorvastatin (LIPITOR) 80 MG tablet Take 1 tablet (80 mg total) by mouth every evening. 02/16/22   Rai, Delene Ruffini, MD  Biotin 5 MG TBDP Take 1 tablet by mouth daily.    [provider]  fluticasone-salmeterol (ADVAIR) 250-50 MCG/ACT AEPB Inhale 1 puff into the lungs as needed (for wheezing and shortness of breath).    [provider]  fluticasone-salmeterol (WIXELA INHUB) 250-50 MCG/ACT AEPB Inhale 1 puff into the lungs as needed (for wheezing and shortness of breath). 08/06/21   [provider]  glucose blood  (ONETOUCH VERIO) test strip USE AS DIRECTED TO CHECK  BLOOD SUGAR TWICE DAILY 06/29/21   [provider]  hydrALAZINE (APRESOLINE) 100 MG tablet Take 100 mg by mouth 3 (three) times daily. 09/28/21   [provider]  hydrochlorothiazide (HYDRODIURIL) 25 MG tablet Take 25 mg by mouth daily. 08/17/19   [provider]  isosorbide mononitrate (IMDUR) 60 MG 24 hr tablet Take 60 mg by mouth in the morning and at bedtime. 05/11/19   [provider]  lactobacillus acidophilus (BACID) TABS tablet Take 2 tablets by mouth 3 (three) times daily. Probiotic    [provider]  losartan (COZAAR) 100 MG tablet Take 100 mg by mouth daily.    [provider]  metFORMIN (GLUCOPHAGE) 500 MG tablet Take 500 mg by mouth 2 (two) times daily. 02/12/22   [provider]  Multiple Vitamin (QUINTABS) TABS Take 1 tablet by mouth daily.    [provider]  Multiple Vitamins-Minerals (MULTIPLE VITAMINS/WOMENS) tablet Take 1 tablet by mouth daily.    [provider]  nitroGLYCERIN (NITROSTAT) 0.4 MG SL tablet Place 1 tablet (0.4 mg total) under the tongue every 5 (five) minutes as needed for chest pain. 06/10/18   Clydie Braun, MD  Omega-3 Fatty Acids (FISH OIL) 1000 MG CAPS Take 1,000 mg by mouth daily.    [provider]  [facility] VERIO test strip 1 each 2 (two) times daily. 09/15/21   [provider]  Polyethyl Glycol-Propyl Glycol (SYSTANE OP) Place 1 drop into both eyes daily.    [provider]  potassium chloride (KLOR-CON M) 10 MEQ tablet Take by mouth. 03/21/22   [provider]  sodium chloride (OCEAN) 0.65 % SOLN nasal spray Place 1 spray into both nostrils as needed for congestion.    [provider]    Physical Exam: Vitals:   04/19/23 0981 04/19/23 0619 04/19/23 0823 04/19/23 1105  BP:      Pulse: 61     Resp: 10     Temp:  98.2 F (36.8 C)  98.3 F (36.8 C)  TempSrc:  Oral  Oral  SpO2:  99%     Weight:   71.7 kg   Height:   5\' 2"  (1.575 m)    General: Sick appearing woman laying on her right side. Mild distress with ongoing vomiting. HEENT: [place]/AT. Anicteric sclera. Dry mucous membrane CV: RRR. No murmurs, rubs, or gallops. No LE edema Pulmonary: Lungs CTAB. Normal effort. No wheezing or rales. Abdominal: Soft. Mild generalized tenderness to palpation. No rebound or guarding.  Normal bowel sounds. Extremities: Palpable radial and DP pulses. Normal ROM. Skin: Warm and dry. No obvious rash or lesions. Decreased skin turgor Neuro: A&Ox3. Moves all extremities. Normal sensation to light touch. No focal deficit. Psych: Normal mood and affect  Data Reviewed: Labs show sodium 135, K+ 3.8,  glucose 149, creatinine 0.95, AST/ALT 30/11, alk phos 52, bilirubin 1.3, WBC 5.8, Hgb 10.1, platelet 218, lipase 43, troponin 4 CT A/P shows possible mild colitis and large diverticulosis but no bowel obstruction or diverticulitis. EKG shows sinus rhythm with HR of 60  Assessment and Plan: Krystal Vargas is a 67 y.o. female with medical history significant of asthma, CVA, HTN, HLD, T2DM, bradycardia, fibromyalgia and endometrial cancer presented to the ED for evaluation of nausea and vomiting and admitted for colitis.  # Colitis # Intractable N/V Patient presented with abrupt onset of intractable nausea and vomiting found to have possible mild colitis on imaging. She has no leukocytosis and remains afebrile. She does endorse abdominal pain and has persistent nausea/vomiting. She has had a few loose stools but no risk factors for C. difficile. -IV Zosyn -IV NS 100 cc/h -As needed Zofran for nausea and vomiting -Check GI panel, electrolytes -Trend CBC, fever curve  # T2DM Last A1c 6.1% 1 year ago.  Currently on metformin but will hold for now in the setting of GI symptoms. -SSI with meals, CBG monitoring -Follow-up repeat A1c  # HTN BP elevated with SBP in the 110s to 150s. -Continue  losartan and HCTZ  # Hx of CVA # HLD -Continue aspirin and atorvastatin  # Asthma -As needed albuterol nebs -Dulera twice daily   Advance Care Planning:   Code Status: Full Code   Consults: None  Family Communication: No family at bedside  Severity of Illness: The appropriate patient status for this patient is OBSERVATION. Observation status is judged to be reasonable and necessary in order to provide the required intensity of service to ensure the patient's safety. The patient's presenting symptoms, physical exam findings, and initial radiographic and laboratory data in the context of their medical condition is felt to place them at decreased risk for further clinical deterioration. Furthermore, it is anticipated that the patient will be medically stable for discharge from the hospital within 2 midnights of admission.   Author: Steffanie Rainwater, MD 04/19/2023 11:32 AM  For on call review [email or web address].

## 2023-04-19 NOTE — ED Notes (Signed)
Attempted to do an EKG, pt began to vomit and will come back after pt is done.

## 2023-04-19 NOTE — Plan of Care (Signed)
  Problem: Education: Goal: Knowledge of General Education information will improve Description: Including pain rating scale, medication(s)/side effects and non-pharmacologic comfort measures Outcome: Progressing   Problem: Coping: Goal: Level of anxiety will decrease Outcome: Progressing   

## 2023-04-20 DIAGNOSIS — M797 Fibromyalgia: Secondary | ICD-10-CM | POA: Diagnosis present

## 2023-04-20 DIAGNOSIS — Z888 Allergy status to other drugs, medicaments and biological substances status: Secondary | ICD-10-CM | POA: Diagnosis not present

## 2023-04-20 DIAGNOSIS — K529 Noninfective gastroenteritis and colitis, unspecified: Secondary | ICD-10-CM | POA: Diagnosis not present

## 2023-04-20 DIAGNOSIS — E876 Hypokalemia: Secondary | ICD-10-CM

## 2023-04-20 DIAGNOSIS — Z8249 Family history of ischemic heart disease and other diseases of the circulatory system: Secondary | ICD-10-CM | POA: Diagnosis not present

## 2023-04-20 DIAGNOSIS — R42 Dizziness and giddiness: Secondary | ICD-10-CM | POA: Diagnosis present

## 2023-04-20 DIAGNOSIS — E860 Dehydration: Secondary | ICD-10-CM | POA: Diagnosis present

## 2023-04-20 DIAGNOSIS — I10 Essential (primary) hypertension: Secondary | ICD-10-CM

## 2023-04-20 DIAGNOSIS — Z79899 Other long term (current) drug therapy: Secondary | ICD-10-CM | POA: Diagnosis not present

## 2023-04-20 DIAGNOSIS — E1165 Type 2 diabetes mellitus with hyperglycemia: Secondary | ICD-10-CM | POA: Diagnosis present

## 2023-04-20 DIAGNOSIS — Z881 Allergy status to other antibiotic agents status: Secondary | ICD-10-CM | POA: Diagnosis not present

## 2023-04-20 DIAGNOSIS — R1110 Vomiting, unspecified: Secondary | ICD-10-CM | POA: Diagnosis present

## 2023-04-20 DIAGNOSIS — Z833 Family history of diabetes mellitus: Secondary | ICD-10-CM | POA: Diagnosis not present

## 2023-04-20 DIAGNOSIS — R7989 Other specified abnormal findings of blood chemistry: Secondary | ICD-10-CM

## 2023-04-20 DIAGNOSIS — D649 Anemia, unspecified: Secondary | ICD-10-CM | POA: Diagnosis present

## 2023-04-20 DIAGNOSIS — E7800 Pure hypercholesterolemia, unspecified: Secondary | ICD-10-CM | POA: Diagnosis present

## 2023-04-20 DIAGNOSIS — Z7984 Long term (current) use of oral hypoglycemic drugs: Secondary | ICD-10-CM | POA: Diagnosis not present

## 2023-04-20 DIAGNOSIS — Z87891 Personal history of nicotine dependence: Secondary | ICD-10-CM | POA: Diagnosis not present

## 2023-04-20 DIAGNOSIS — Z8542 Personal history of malignant neoplasm of other parts of uterus: Secondary | ICD-10-CM | POA: Diagnosis not present

## 2023-04-20 DIAGNOSIS — J45909 Unspecified asthma, uncomplicated: Secondary | ICD-10-CM | POA: Diagnosis present

## 2023-04-20 DIAGNOSIS — D446 Neoplasm of uncertain behavior of carotid body: Secondary | ICD-10-CM | POA: Diagnosis present

## 2023-04-20 DIAGNOSIS — Z7951 Long term (current) use of inhaled steroids: Secondary | ICD-10-CM | POA: Diagnosis not present

## 2023-04-20 DIAGNOSIS — A084 Viral intestinal infection, unspecified: Secondary | ICD-10-CM | POA: Diagnosis present

## 2023-04-20 DIAGNOSIS — R17 Unspecified jaundice: Secondary | ICD-10-CM | POA: Diagnosis present

## 2023-04-20 DIAGNOSIS — Z8673 Personal history of transient ischemic attack (TIA), and cerebral infarction without residual deficits: Secondary | ICD-10-CM | POA: Diagnosis not present

## 2023-04-20 DIAGNOSIS — Z825 Family history of asthma and other chronic lower respiratory diseases: Secondary | ICD-10-CM | POA: Diagnosis not present

## 2023-04-20 DIAGNOSIS — Z7982 Long term (current) use of aspirin: Secondary | ICD-10-CM | POA: Diagnosis not present

## 2023-04-20 LAB — CBC
HCT: 38.9 % (ref 36.0–46.0)
Hemoglobin: 12.4 g/dL (ref 12.0–15.0)
MCH: 30.5 pg (ref 26.0–34.0)
MCHC: 31.9 g/dL (ref 30.0–36.0)
MCV: 95.8 fL (ref 80.0–100.0)
Platelets: 266 10*3/uL (ref 150–400)
RBC: 4.06 MIL/uL (ref 3.87–5.11)
RDW: 14.4 % (ref 11.5–15.5)
WBC: 7.8 10*3/uL (ref 4.0–10.5)
nRBC: 0 % (ref 0.0–0.2)

## 2023-04-20 LAB — COMPREHENSIVE METABOLIC PANEL
ALT: 10 U/L (ref 0–44)
AST: 27 U/L (ref 15–41)
Albumin: 3.9 g/dL (ref 3.5–5.0)
Alkaline Phosphatase: 52 U/L (ref 38–126)
Anion gap: 13 (ref 5–15)
BUN: 19 mg/dL (ref 8–23)
CO2: 25 mmol/L (ref 22–32)
Calcium: 9.3 mg/dL (ref 8.9–10.3)
Chloride: 103 mmol/L (ref 98–111)
Creatinine, Ser: 1.17 mg/dL — ABNORMAL HIGH (ref 0.44–1.00)
GFR, Estimated: 51 mL/min — ABNORMAL LOW (ref >60)
Glucose, Bld: 100 mg/dL — ABNORMAL HIGH (ref 70–99)
Potassium: 3 mmol/L — ABNORMAL LOW (ref 3.5–5.1)
Sodium: 141 mmol/L (ref 135–145)
Total Bilirubin: 1.6 mg/dL — ABNORMAL HIGH (ref <1.2)
Total Protein: 7.1 g/dL (ref 6.5–8.1)

## 2023-04-20 LAB — GLUCOSE, CAPILLARY
Glucose-Capillary: 81 mg/dL (ref 70–99)
Glucose-Capillary: 99 mg/dL (ref 70–99)
Glucose-Capillary: 155 mg/dL — ABNORMAL HIGH (ref 70–99)
Glucose-Capillary: 100 mg/dL — ABNORMAL HIGH (ref 70–99)

## 2023-04-20 LAB — PHOSPHORUS: Phosphorus: 3.5 mg/dL (ref 2.5–4.6)

## 2023-04-20 LAB — HEMOGLOBIN A1C
Hgb A1c MFr Bld: 5.9 % — ABNORMAL HIGH (ref 4.8–5.6)
Mean Plasma Glucose: 122.63 mg/dL

## 2023-04-20 LAB — HIV ANTIBODY (ROUTINE TESTING W REFLEX): HIV Screen 4th Generation wRfx: NONREACTIVE

## 2023-04-20 LAB — MAGNESIUM: Magnesium: 2.1 mg/dL (ref 1.7–2.4)

## 2023-04-20 MED ORDER — POTASSIUM CHLORIDE 10 MEQ/100ML IV SOLN
10.0000 meq | INTRAVENOUS | Status: AC
  Administered 2023-04-20 (×4): 10 meq via INTRAVENOUS
  Filled 2023-04-20 (×2): qty 100

## 2023-04-20 MED ORDER — POTASSIUM CHLORIDE CRYS ER 20 MEQ PO TBCR
40.0000 meq | EXTENDED_RELEASE_TABLET | Freq: Once | ORAL | Status: AC
  Administered 2023-04-20: 40 meq via ORAL
  Filled 2023-04-20: qty 2

## 2023-04-20 MED ORDER — SODIUM CHLORIDE 0.9 % IV SOLN: INTRAVENOUS | Status: DC

## 2023-04-20 NOTE — Progress Notes (Signed)
[facility] PROGRESS NOTE   Roi Ciarcia WUJ:[id number] DOB: 02/11/1956 DOA: 04/18/2023  PCP: Patient, No Pcp Per  Brief History: 67 y.o. female with medical history significant of asthma, CVA, HTN, HLD, T2DM, bradycardia, fibromyalgia and endometrial cancer presented to the ED for evaluation of intractable nausea and vomiting.  Symptoms started suddenly.  She also had a few episodes of diarrhea.  CT scan raise concern for mild colitis.  She was hospitalized for further management.    Consultants: None  Procedures: None    Subjective/Interval History: Patient mentions no diarrhea in the last 12 hours.  Still feels nauseated and has abdominal discomfort.  No chest pain or shortness of breath.    Assessment/Plan:  Acute gastroenteritis Likely viral.  CT scan raised concern for colitis though findings were sort of atypical.  WBC was noted to be normal. Patient empirically started on Zosyn.  Follow-up on cultures.  Diarrhea appears to be slowing down.  Follow-up on GI pathogen panel.  No concern for C. difficile at this time. If no profound diarrhea over the next 24 hours Zosyn can likely be de-escalated tomorrow.  Hypokalemia Will be supplemented.  Magnesium 2.1.  Elevated creatinine Creatinine was 0.95 yesterday and noted to be 1.17 today.  Continue with IV fluids.  Monitor urine output.  Recheck labs tomorrow.  Mild hyperbilirubinemia Other LFTs are normal.  Continue to trend for now.  Diabetes mellitus type 2 HbA1c 6.1 about a year ago.  Metformin on hold.  SSI.  Essential hypertension Occasional elevated blood pressure readings noted.  She is noted to be on HCTZ and losartan.  We will hold them due to rising creatinine. Use hydralazine as needed.  History of stroke Continue aspirin and statin.  History of asthma Stable.  Free fluid in abdominal cavity This was noted on CT scan.  Nonspecific but could be related to her acute illness.  Will recommend repeating  CT scan in a few weeks.  DVT Prophylaxis: Lovenox Code Status: Full code Family Communication: Discussed with patient.  No family at bedside Disposition Plan: Hopefully return home when improved  Status is: Observation The patient will require care spanning > 2 midnights and should be moved to inpatient because: Persistent nausea.  Poor oral intake    Medications: Scheduled:  aspirin EC  325 mg Oral Daily   atorvastatin  80 mg Oral QPM   enoxaparin (LOVENOX) injection  40 mg Subcutaneous Q24H   hydrochlorothiazide  25 mg Oral Daily   insulin aspart  0-15 Units Subcutaneous TID WC   insulin aspart  0-5 Units Subcutaneous QHS   losartan  100 mg Oral Daily   mometasone-formoterol  2 puff Inhalation BID   Continuous:  sodium chloride     piperacillin-tazobactam (ZOSYN)  IV 3.375 g (04/20/23 0530)   potassium chloride 10 mEq (04/20/23 0845)   NFA:[id number] **OR** acetaminophen, albuterol, ondansetron **OR** ondansetron (ZOFRAN) IV, senna-docusate  Antibiotics: Anti-infectives (From admission, onward)    Start     Dose/Rate Route Frequency Ordered Stop   04/19/23 1230  piperacillin-tazobactam (ZOSYN) IVPB 3.375 g        3.375 g 12.5 mL/hr over 240 Minutes Intravenous Every 8 hours 04/19/23 1213         Objective:  Vital Signs  Vitals:   04/19/23 2007 04/19/23 2152 04/20/23 0457 04/20/23 0823  BP:  (!) 120/57 (!) 140/66   Pulse:  (!) 54 (!) 48   Resp:  15 15   Temp:  98.9 F (37.2 C)  98.9 F (37.2 C)   TempSrc:      SpO2: 100% 94% 99% 97%  Weight:      Height:        Intake/Output Summary (Last 24 hours) at 04/20/2023 1005 Last data filed at 04/20/2023 0600 Gross per 24 hour  Intake 1708.27 ml  Output --  Net 1708.27 ml   Filed Weights   04/18/23 2132 04/19/23 0823  Weight: 72 kg 71.7 kg    General appearance: Awake alert.  In no distress.  Fatigued Resp: Clear to auscultation bilaterally.  Normal effort Cardio: S1-S2 is normal regular.  No S3-S4.   No rubs murmurs or bruit GI: Abdomen is soft.  Diffusely tender without any rebound rigidity or guarding.  No masses organomegaly. Extremities: No edema.  Deconditioned Neurologic: Alert and oriented x3.  No focal neurological deficits.    Lab Results:  Data Reviewed: I have personally reviewed following labs and reports of the imaging studies  CBC: Recent Labs  Lab 04/18/23 2224 04/20/23 0330  WBC 5.8 7.8  HGB 10.1* 12.4  HCT 31.2* 38.9  MCV 96.0 95.8  PLT 218 266    Basic Metabolic Panel: Recent Labs  Lab 04/18/23 2224 04/20/23 0330  NA 135 141  K 3.8 3.0*  CL 101 103  CO2 24 25  GLUCOSE 149* 100*  BUN 18 19  CREATININE 0.95 1.17*  CALCIUM 8.9 9.3  MG  --  2.1  PHOS  --  3.5    GFR: Estimated Creatinine Clearance: 42.6 mL/min (A) (by C-G formula based on SCr of 1.17 mg/dL (H)).  Liver Function Tests: Recent Labs  Lab 04/18/23 2224 04/20/23 0330  AST 30 27  ALT 11 10  ALKPHOS 52 52  BILITOT 1.3* 1.6*  PROT 7.5 7.1  ALBUMIN 4.2 3.9    Recent Labs  Lab 04/18/23 2224  LIPASE 43    CBG: Recent Labs  Lab 04/19/23 1626 04/19/23 2149 04/20/23 0738  GLUCAP 90 90 81    Radiology Studies: CT ABDOMEN PELVIS W CONTRAST  Result Date: 04/19/2023 CLINICAL DATA:  67 year old female with abdominal pain. EXAM: CT ABDOMEN AND PELVIS WITH CONTRAST TECHNIQUE: Multidetector CT imaging of the abdomen and pelvis was performed using the standard protocol following bolus administration of intravenous contrast. RADIATION DOSE REDUCTION: This exam was performed according to the departmental dose-optimization program which includes automated exposure control, adjustment of the mA and/or kV according to patient size and/or use of iterative reconstruction technique. CONTRAST:  OMNIPAQUE IOHEXOL 300 MG/ML  SOLN COMPARISON:  CT Abdomen and Pelvis 08/27/2022. FINDINGS: Lower chest: Lower lung volumes. Minor lung base atelectasis. No pericardial or pleural effusion.  Hepatobiliary: Posterior right hepatic dome hemangioma is stable from 03/23/2022 CT (no follow-up imaging recommended). Negative gallbladder. Pancreas: Negative. Spleen: Negative. Adrenals/Urinary Tract: Normal adrenal glands. Nonobstructed kidneys with symmetric renal enhancement, contrast excretion. Decompressed ureters. Unremarkable bladder. Numerous pelvic phleboliths. Stomach/Bowel: Mostly decompressed rectosigmoid colon in the pelvis. Redundant sigmoid in the left lower quadrant partially contains gas. Diverticulosis throughout the descending colon and at the splenic flexure. Those segments are decompressed. No convincing inflammation. Similar decompressed appearance of the transverse colon. Diverticulosis at the hepatic flexure. Decompressed right colon. Elongated but normal appendix on coronal image 80 just below the liver margin, tracking cephalad from the cecum which is decompressed. Decompressed terminal ileum. No dilated small bowel. Decompressed stomach and duodenum. No free air. No free fluid or mesenteric inflammation identified in the abdomen. Small fat containing umbilical hernia. No  herniated bowel. Vascular/Lymphatic: Aortoiliac calcified atherosclerosis. Normal caliber abdominal aorta. Major arterial structures remain patent. Grossly patent portal venous system. No lymphadenopathy identified. Reproductive: Surgically absent uterus. Diminutive or absent ovaries. Numerous pelvic phleboliths. Other: Small volume of simple density pelvis free fluid on series 2, image 59. Musculoskeletal: Advanced lumbar facet arthropathy. No acute osseous abnormality identified. Levoconvex scoliosis today appears in part to be positional artifact. IMPRESSION: 1. Small volume of simple free fluid in the pelvis is abnormal in this age group but nonspecific. Normal appendix. The colon is redundant but mostly decompressed. Mild colitis would be difficult to exclude. No evidence of bowel obstruction. Intermittent large  bowel diverticulosis but no focal mesenteric inflammation identified. 2.  Aortic Atherosclerosis (ICD10-I70.0). Electronically Signed   By: Odessa Fleming M.D.   On: 04/19/2023 06:57       LOS: 0 days   Ludie Hudon Rito Ehrlich  [facility] Pager on [email or web address]  04/20/2023, 10:05 AM

## 2023-04-20 NOTE — Plan of Care (Signed)
  Problem: Activity: Goal: Risk for activity intolerance will decrease Outcome: Progressing   Problem: Coping: Goal: Level of anxiety will decrease Outcome: Progressing   

## 2023-04-21 DIAGNOSIS — E876 Hypokalemia: Secondary | ICD-10-CM | POA: Diagnosis not present

## 2023-04-21 DIAGNOSIS — R42 Dizziness and giddiness: Secondary | ICD-10-CM

## 2023-04-21 DIAGNOSIS — K529 Noninfective gastroenteritis and colitis, unspecified: Secondary | ICD-10-CM | POA: Diagnosis not present

## 2023-04-21 LAB — CBC
HCT: 35.5 % — ABNORMAL LOW (ref 36.0–46.0)
Hemoglobin: 11.2 g/dL — ABNORMAL LOW (ref 12.0–15.0)
MCH: 30.9 pg (ref 26.0–34.0)
MCHC: 31.5 g/dL (ref 30.0–36.0)
MCV: 97.8 fL (ref 80.0–100.0)
Platelets: 244 10*3/uL (ref 150–400)
RBC: 3.63 MIL/uL — ABNORMAL LOW (ref 3.87–5.11)
RDW: 14.5 % (ref 11.5–15.5)
WBC: 5.8 10*3/uL (ref 4.0–10.5)
nRBC: 0 % (ref 0.0–0.2)

## 2023-04-21 LAB — COMPREHENSIVE METABOLIC PANEL
ALT: 13 U/L (ref 0–44)
AST: 26 U/L (ref 15–41)
Albumin: 3.4 g/dL — ABNORMAL LOW (ref 3.5–5.0)
Alkaline Phosphatase: 45 U/L (ref 38–126)
Anion gap: 11 (ref 5–15)
BUN: 14 mg/dL (ref 8–23)
CO2: 25 mmol/L (ref 22–32)
Calcium: 8.8 mg/dL — ABNORMAL LOW (ref 8.9–10.3)
Chloride: 104 mmol/L (ref 98–111)
Creatinine, Ser: 1.04 mg/dL — ABNORMAL HIGH (ref 0.44–1.00)
GFR, Estimated: 59 mL/min — ABNORMAL LOW (ref >60)
Glucose, Bld: 93 mg/dL (ref 70–99)
Potassium: 2.7 mmol/L — CL (ref 3.5–5.1)
Sodium: 140 mmol/L (ref 135–145)
Total Bilirubin: 1.4 mg/dL — ABNORMAL HIGH (ref <1.2)
Total Protein: 6.1 g/dL — ABNORMAL LOW (ref 6.5–8.1)

## 2023-04-21 LAB — GLUCOSE, CAPILLARY
Glucose-Capillary: 67 mg/dL — ABNORMAL LOW (ref 70–99)
Glucose-Capillary: 102 mg/dL — ABNORMAL HIGH (ref 70–99)
Glucose-Capillary: 103 mg/dL — ABNORMAL HIGH (ref 70–99)
Glucose-Capillary: 124 mg/dL — ABNORMAL HIGH (ref 70–99)

## 2023-04-21 LAB — MAGNESIUM: Magnesium: 1.9 mg/dL (ref 1.7–2.4)

## 2023-04-21 MED ORDER — POTASSIUM CHLORIDE 10 MEQ/100ML IV SOLN
10.0000 meq | INTRAVENOUS | Status: AC
Start: 2023-04-21 — End: 2023-04-21
  Administered 2023-04-21 (×6): 10 meq via INTRAVENOUS
  Filled 2023-04-21 (×6): qty 100

## 2023-04-21 MED ORDER — POTASSIUM CHLORIDE CRYS ER 20 MEQ PO TBCR
40.0000 meq | EXTENDED_RELEASE_TABLET | Freq: Once | ORAL | Status: AC
  Administered 2023-04-21: 40 meq via ORAL
  Filled 2023-04-21: qty 2

## 2023-04-21 MED ORDER — MECLIZINE HCL 25 MG PO TABS
25.0000 mg | ORAL_TABLET | Freq: Three times a day (TID) | ORAL | Status: AC
  Administered 2023-04-21 (×3): 25 mg via ORAL
  Filled 2023-04-21 (×3): qty 1

## 2023-04-21 MED ORDER — MECLIZINE HCL 25 MG PO TABS: 25.0000 mg | ORAL_TABLET | Freq: Three times a day (TID) | ORAL | Status: DC | PRN

## 2023-04-21 NOTE — Progress Notes (Signed)
PT Cancellation Note  Patient Details Name: Krystal Vargas MRN: [id number] DOB: Sep 28, 1955   Cancelled Treatment:    Reason Eval/Treat Not Completed: Medical issues which prohibited therapy (pt reports she's "too woozy" to attempt mobility at present. Will follow.)   Tamala Ser PT 04/21/2023  Acute Rehabilitation Services  Office [phone]

## 2023-04-21 NOTE — Evaluation (Signed)
Occupational Therapy Evaluation Patient Details Name: Krystal Vargas MRN: [id number] DOB: 1955/08/27 Today's Date: 04/21/2023   History of Present Illness PT is a 67 yo female admitted with nausea/vomiting and diarrehea. PT +colitis. Pt also with dizziness since admission. MRI scheduled PMH: asthma, CVA, HTN, T2DM, fibromyalgia, endometrial CA, recent visit to audiologist with documented hearing loss.   Clinical Impression   Pt admitted with the above diagnosis and has the deficits listed below. Pt would benefit from cont OT to increase independence with basic adls and adl transfers to eventual mod I level so she can d/c home with her husband who she cares for due to dementia.  Currently pt is resistant to walking much in room bc of dizziness. If pt progresses, feel she can dc home with HHOT vs. Nothing. If dizziness does not resolve, pt will not be able to handle taking care of self and husband at home at d/c without having some outside assist.  If no outside assist available, may need to reconsider d/c plan.        If plan is discharge home, recommend the following: A little help with walking and/or transfers;A little help with bathing/dressing/bathroom;Assistance with cooking/housework;Assist for transportation    Functional Status Assessment  Patient has had a recent decline in their functional status and demonstrates the ability to make significant improvements in function in a reasonable and predictable amount of time.  Equipment Recommendations  Other (comment) (tbd)    Recommendations for Other Services       Precautions / Restrictions Precautions Precautions: Fall Precaution Comments: Pt fell 3 days before nausea/vomiting began Restrictions Weight Bearing Restrictions: No      Mobility Bed Mobility Overal bed mobility: Modified Independent             General bed mobility comments: Pt did not need assist to get to EOB.    Transfers Overall transfer level: Needs  assistance Equipment used: Rolling walker (2 wheels) Transfers: Sit to/from Stand, Bed to chair/wheelchair/BSC Sit to Stand: Supervision     Step pivot transfers: Contact guard assist     General transfer comment: cues for hand placement on each transfer      Balance Overall balance assessment: Needs assistance Sitting-balance support: Feet supported Sitting balance-Leahy Scale: Good     Standing balance support: Bilateral upper extremity supported, Reliant on assistive device for balance Standing balance-Leahy Scale: Poor Standing balance comment: Pt required outside support to stand. Pt did not report room spinning but did report feeling woozy with any mobility.                           ADL either performed or assessed with clinical judgement   ADL Overall ADL's : Needs assistance/impaired Eating/Feeding: Independent;Sitting   Grooming: Wash/dry hands;Wash/dry face;Oral care;Sitting;Independent Grooming Details (indicate cue type and reason): would need more assist standing due to dizziness. Upper Body Bathing: Set up;Sitting   Lower Body Bathing: Minimal assistance;Sit to/from stand;Cueing for compensatory techniques   Upper Body Dressing : Set up;Sitting   Lower Body Dressing: Minimal assistance;Sit to/from stand;Cueing for compensatory techniques   Toilet Transfer: Minimal assistance;BSC/3in1;Rolling walker (2 wheels);Rollator (4 wheels) Toilet Transfer Details (indicate cue type and reason): pt felt unsteady and would not walk to bathroom. Only agreeable to [facility] Toileting- Clothing Manipulation and Hygiene: Supervision/safety;Sitting/lateral lean;Cueing for compensatory techniques       Functional mobility during ADLs: Contact guard assist;Rolling walker (2 wheels) General ADL Comments: Pt mobilized well but  c/o dizziness when up. Pt stated she has been laying flat in bed for last 3 days. Encouraged pt to sit in chair for a few hours to get out of supine  position.     Vision Baseline Vision/History: 1 Wears glasses Ability to See in Adequate Light: 0 Adequate Patient Visual Report: No change from baseline Vision Assessment?: Yes Eye Alignment: Within Functional Limits Ocular Range of Motion: Within Functional Limits Alignment/Gaze Preference: Within Defined Limits Tracking/Visual Pursuits: Able to track stimulus in all quads without difficulty Saccades: Within functional limits Convergence: Within functional limits Visual Fields: No apparent deficits     Perception Perception: Within Functional Limits       Praxis Praxis: WFL       Pertinent Vitals/Pain Pain Assessment Pain Assessment: No/denies pain     Extremity/Trunk Assessment Upper Extremity Assessment Upper Extremity Assessment: Overall WFL for tasks assessed   Lower Extremity Assessment Lower Extremity Assessment: Defer to PT evaluation   Cervical / Trunk Assessment Cervical / Trunk Assessment: Normal   Communication Communication Communication: No apparent difficulties   Cognition Arousal: Alert Behavior During Therapy: WFL for tasks assessed/performed Overall Cognitive Status: Within Functional Limits for tasks assessed                                 General Comments: appears cognitively intact     General Comments  Pt most limited by feeling of dizziness.    Exercises     Shoulder Instructions      Home Living Family/patient expects to be discharged to:: Private residence Living Arrangements: Spouse/significant other;Other (Comment) (spouse has dementia) Available Help at Discharge: Friend(s);Available PRN/intermittently Type of Home: Apartment Home Access: Elevator     Home Layout: One level     Bathroom Shower/Tub: Chief Strategy Officer: Standard     Home Equipment: Rollator (4 wheels)   Additional Comments: Pt has been receiving HHPT      Prior Functioning/Environment Prior Level of Function :  Independent/Modified Independent             Mobility Comments: uses rollator. Does not drive ADLs Comments: does not drive. Pt does cook/clean/takes care of husband with dementia        OT Problem List: Decreased activity tolerance;Impaired balance (sitting and/or standing)      OT Treatment/Interventions: Self-care/ADL training;Therapeutic activities;DME and/or AE instruction;Balance training    OT Goals(Current goals can be found in the care plan section) Acute Rehab OT Goals Patient Stated Goal: to not feel dizzy OT Goal Formulation: With patient Time For Goal Achievement: 05/05/23 Potential to Achieve Goals: Good ADL Goals Pt Will Perform Grooming: with supervision;standing Pt Will Perform Lower Body Bathing: with supervision;sit to/from stand Pt Will Perform Lower Body Dressing: with supervision;sit to/from stand Additional ADL Goal #1: Pt will walk to bathroom and complete all toileting tasks with walker and supervision. Additional ADL Goal #2: Pt will use walker to gather all clothing for dressing adl with supervision.  OT Frequency: Min 1X/week    Co-evaluation              AM-PAC OT "6 Clicks" Daily Activity     Outcome Measure Help from another person eating meals?: None Help from another person taking care of personal grooming?: None Help from another person toileting, which includes using toliet, bedpan, or urinal?: A Little Help from another person bathing (including washing, rinsing, drying)?: A Little Help from another  person to put on and taking off regular upper body clothing?: A Little Help from another person to put on and taking off regular lower body clothing?: A Little 6 Click Score: 20   End of Session Equipment Utilized During Treatment: Rolling walker (2 wheels) Nurse Communication: Mobility status  Activity Tolerance: Other (comment) (limited by dizziness) Patient left: in chair;with call bell/phone within reach  OT Visit Diagnosis:  Unsteadiness on feet (R26.81);Dizziness and giddiness (R42)                Time: 4098-1191 OT Time Calculation (min): 23 min Charges:  OT General Charges $OT Visit: 1 Visit OT Evaluation $OT Eval Moderate Complexity: 1 Mod  Hope Budds 04/21/2023, 12:00 PM

## 2023-04-21 NOTE — Progress Notes (Signed)
[facility] PROGRESS NOTE   Krystal Vargas XBM:[id number] DOB: 1955/09/25 DOA: 04/18/2023  PCP: Patient, No Pcp Per  Brief History: 67 y.o. female with medical history significant of asthma, CVA, HTN, HLD, T2DM, bradycardia, fibromyalgia and endometrial cancer presented to the ED for evaluation of intractable nausea and vomiting.  Symptoms started suddenly.  She also had a few episodes of diarrhea.  CT scan raise concern for mild colitis.  She was hospitalized for further management.    Consultants: None  Procedures: None    Subjective/Interval History: Patient mentions she has not had a bowel movement in more than a day now.  Nausea has improved.  Still feels very dizzy when she tries to get up.  She describes it as a spinning sensation.  She mentions that the symptoms started before her nausea and vomiting.     Assessment/Plan:  Acute gastroenteritis Possibly viral gastroenteritis though most of her symptoms may have been due to her vertigo.   CT scan raised concern for colitis though findings were sort of atypical.  WBC was noted to be normal. Patient empirically started on Zosyn.  Diarrhea has subsided.  WBC was never elevated.  Will discontinue her antibiotics.    Vertigo She likely has a vertiginous symptoms which triggered her nausea and vomiting.  Does not explain the diarrhea though. Does not have any focal neurological deficits.  But she tells does tell me that she has had some hearing problems in the right ear for a few months.  She was seen by ENT recently and sounds like she underwent audiological testing which revealed hearing loss.  Examination of the ear only shows some cerumen but no other abnormalities noted.  She was encouraged to follow-up with the ENT We will proceed with MRI brain.  Will give her meclizine.  PT and OT evaluation.  Hypokalemia Aggressively replete.  Magnesium 1.9.  Elevated creatinine Creatinine stable for the most part.  Was given IV  fluids.  Now seems to be tolerating diet.  Monitor urine output.  Mild hyperbilirubinemia Other LFTs are normal.  Continue to trend for now.  Diabetes mellitus type 2 HbA1c 6.1 about a year ago.  Metformin on hold.  SSI.  Essential hypertension Occasional elevated blood pressure readings noted.  She is noted to be on HCTZ and losartan.  These are currently on hold due to elevated creatinine and hypokalemia.  History of stroke Continue aspirin and statin.  History of asthma Stable.  Free fluid in abdominal cavity This was noted on CT scan.  Nonspecific but could be related to her acute illness.  Will recommend repeating CT scan in a few weeks.  DVT Prophylaxis: Lovenox Code Status: Full code Family Communication: Discussed with patient.  No family at bedside Disposition Plan: Hopefully return home when improved     Medications: Scheduled:  aspirin EC  325 mg Oral Daily   atorvastatin  80 mg Oral QPM   enoxaparin (LOVENOX) injection  40 mg Subcutaneous Q24H   insulin aspart  0-15 Units Subcutaneous TID WC   meclizine  25 mg Oral TID   mometasone-formoterol  2 puff Inhalation BID   Continuous:  piperacillin-tazobactam (ZOSYN)  IV 3.375 g (04/21/23 0456)   potassium chloride 10 mEq (04/21/23 0917)   UUV:[id number] **OR** acetaminophen, albuterol, meclizine **FOLLOWED BY** [START ON 04/22/2023] meclizine, ondansetron **OR** ondansetron (ZOFRAN) IV, senna-docusate  Antibiotics: Anti-infectives (From admission, onward)    Start     Dose/Rate Route Frequency Ordered Stop   04/19/23 1230  piperacillin-tazobactam (ZOSYN) IVPB 3.375 g        3.375 g 12.5 mL/hr over 240 Minutes Intravenous Every 8 hours 04/19/23 1213         Objective:  Vital Signs  Vitals:   04/20/23 1634 04/20/23 2114 04/21/23 0501 04/21/23 0805  BP: (!) 146/51 (!) 144/68 (!) 143/68   Pulse: (!) 49 (!) 54 (!) 44   Resp: 16 16 16    Temp: 99 F (37.2 C) 98.3 F (36.8 C) 98.7 F (37.1 C)    TempSrc: Oral Oral Oral   SpO2: 99% 97% 95% 96%  Weight:      Height:        Intake/Output Summary (Last 24 hours) at 04/21/2023 0941 Last data filed at 04/21/2023 0700 Gross per 24 hour  Intake 1698.67 ml  Output 2200 ml  Net -501.33 ml   Filed Weights   04/18/23 2132 04/19/23 0823  Weight: 72 kg 71.7 kg    General appearance: Awake alert.  In no distress Resp: Clear to auscultation bilaterally.  Normal effort Cardio: S1-S2 is normal regular.  No S3-S4.  No rubs murmurs or bruit GI: Abdomen is soft.  Nontender nondistended.  Bowel sounds are present normal.  No masses organomegaly Extremities: No edema.  Physical deconditioning is noted Neurologic: Alert and oriented x3.  No focal neurological deficits.     Lab Results:  Data Reviewed: I have personally reviewed following labs and reports of the imaging studies  CBC: Recent Labs  Lab 04/18/23 2224 04/20/23 0330 04/21/23 0307  WBC 5.8 7.8 5.8  HGB 10.1* 12.4 11.2*  HCT 31.2* 38.9 35.5*  MCV 96.0 95.8 97.8  PLT 218 266 244    Basic Metabolic Panel: Recent Labs  Lab 04/18/23 2224 04/20/23 0330 04/21/23 0307  NA 135 141 140  K 3.8 3.0* 2.7*  CL 101 103 104  CO2 24 25 25   GLUCOSE 149* 100* 93  BUN 18 19 14   CREATININE 0.95 1.17* 1.04*  CALCIUM 8.9 9.3 8.8*  MG  --  2.1 1.9  PHOS  --  3.5  --     GFR: Estimated Creatinine Clearance: 48 mL/min (A) (by C-G formula based on SCr of 1.04 mg/dL (H)).  Liver Function Tests: Recent Labs  Lab 04/18/23 2224 04/20/23 0330 04/21/23 0307  AST 30 27 26   ALT 11 10 13   ALKPHOS 52 52 45  BILITOT 1.3* 1.6* 1.4*  PROT 7.5 7.1 6.1*  ALBUMIN 4.2 3.9 3.4*    Recent Labs  Lab 04/18/23 2224  LIPASE 43    CBG: Recent Labs  Lab 04/20/23 0738 04/20/23 1204 04/20/23 1640 04/20/23 2115 04/21/23 0741  GLUCAP 81 99 155* 100* 67*    Radiology Studies: No results found.     LOS: 1 day   Krystal Vargas [employer] on  [email or web address]  04/21/2023, 9:41 AM

## 2023-04-21 NOTE — Progress Notes (Signed)
   04/21/23 0435  Provider Notification  Provider Name/Title Johann Capers NP  Date Provider Notified 04/21/23  Time Provider Notified [phone]  Method of Notification Call (secure chat)  Notification Reason Critical Result

## 2023-04-22 ENCOUNTER — Inpatient Hospital Stay (HOSPITAL_COMMUNITY): Payer: Medicare Other

## 2023-04-22 DIAGNOSIS — K529 Noninfective gastroenteritis and colitis, unspecified: Secondary | ICD-10-CM | POA: Diagnosis not present

## 2023-04-22 DIAGNOSIS — E876 Hypokalemia: Secondary | ICD-10-CM | POA: Diagnosis not present

## 2023-04-22 DIAGNOSIS — R42 Dizziness and giddiness: Secondary | ICD-10-CM | POA: Diagnosis not present

## 2023-04-22 LAB — MAGNESIUM: Magnesium: 1.8 mg/dL (ref 1.7–2.4)

## 2023-04-22 LAB — BASIC METABOLIC PANEL
Anion gap: 8 (ref 5–15)
BUN: 11 mg/dL (ref 8–23)
CO2: 25 mmol/L (ref 22–32)
Calcium: 8.7 mg/dL — ABNORMAL LOW (ref 8.9–10.3)
Chloride: 107 mmol/L (ref 98–111)
Creatinine, Ser: 0.91 mg/dL (ref 0.44–1.00)
GFR, Estimated: >60 mL/min (ref >60)
Glucose, Bld: 91 mg/dL (ref 70–99)
Potassium: 3.3 mmol/L — ABNORMAL LOW (ref 3.5–5.1)
Sodium: 140 mmol/L (ref 135–145)

## 2023-04-22 LAB — GLUCOSE, CAPILLARY
Glucose-Capillary: 80 mg/dL (ref 70–99)
Glucose-Capillary: 147 mg/dL — ABNORMAL HIGH (ref 70–99)
Glucose-Capillary: 229 mg/dL — ABNORMAL HIGH (ref 70–99)
Glucose-Capillary: 100 mg/dL — ABNORMAL HIGH (ref 70–99)

## 2023-04-22 MED ORDER — MECLIZINE HCL 25 MG PO TABS
25.0000 mg | ORAL_TABLET | Freq: Three times a day (TID) | ORAL | Status: DC
  Administered 2023-04-22 – 2023-04-23 (×4): 25 mg via ORAL
  Filled 2023-04-22 (×4): qty 1

## 2023-04-22 MED ORDER — POTASSIUM CHLORIDE CRYS ER 20 MEQ PO TBCR
40.0000 meq | EXTENDED_RELEASE_TABLET | Freq: Once | ORAL | Status: AC
  Administered 2023-04-22: 40 meq via ORAL
  Filled 2023-04-22: qty 2

## 2023-04-22 MED ORDER — ISOSORBIDE MONONITRATE ER 30 MG PO TB24
30.0000 mg | ORAL_TABLET | Freq: Every day | ORAL | Status: DC
  Administered 2023-04-22 – 2023-04-23 (×2): 30 mg via ORAL
  Filled 2023-04-22 (×2): qty 1

## 2023-04-22 MED ORDER — IOHEXOL 350 MG/ML SOLN
75.0000 mL | Freq: Once | INTRAVENOUS | Status: AC | PRN
  Administered 2023-04-22: 75 mL via INTRAVENOUS

## 2023-04-22 MED ORDER — LOSARTAN POTASSIUM 50 MG PO TABS
100.0000 mg | ORAL_TABLET | Freq: Every day | ORAL | Status: DC
  Administered 2023-04-22 – 2023-04-23 (×2): 100 mg via ORAL
  Filled 2023-04-22 (×2): qty 2

## 2023-04-22 NOTE — Progress Notes (Addendum)
[facility] PROGRESS NOTE   Sherlyn Packman ZHY:[id number] DOB: April 23, 1956 DOA: 04/18/2023  PCP: Patient, No Pcp Per  Brief History: 67 y.o. female with medical history significant of asthma, CVA, HTN, HLD, T2DM, bradycardia, fibromyalgia and endometrial cancer presented to the ED for evaluation of intractable nausea and vomiting.  Symptoms started suddenly.  She also had a few episodes of diarrhea.  CT scan raise concern for mild colitis.  She was hospitalized for further management.    Consultants: None  Procedures: None    Subjective/Interval History: Patient continues to have dizziness.  But her nausea is better.  Denies any headaches.  No diarrhea.   Assessment/Plan:  Acute gastroenteritis Possibly viral gastroenteritis though most of her symptoms may have been due to her vertigo.   CT scan raised concern for colitis though findings were sort of atypical.  WBC was noted to be normal. Patient empirically started on Zosyn.  Diarrhea has subsided.  WBC was never elevated.  Antibiotics were discontinued yesterday. Diarrhea has resolved.  Nausea has subsided.  Vertigo She likely has vertiginous symptoms which triggered her nausea and vomiting.  Does not explain the diarrhea though. Does not have any focal neurological deficits.  But she tells does tell me that she has had some hearing problems in the right ear for a few months.  She was seen by ENT recently and sounds like she underwent audiological testing which revealed hearing loss.  Examination of the ear only shows some cerumen but no other abnormalities noted.  She was encouraged to follow-up with the ENT MRI brain did not show any acute findings. Less well demonstrated basilar flow void when compared to previous MRI.  CT angiogram was recommended.  This was done this morning and does not show any large vessel occlusion. 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  This will need outpatient  monitoring and perhaps referral to neurosurgery. Continue with meclizine.  PT and OT evaluation.  Will keep her on scheduled meclizine for now.  Hypokalemia Potassium level has improved but remains low.  Additional supplementation will be ordered.  Magnesium 1.8.    Elevated creatinine Creatinine stable for the most part.  Was given IV fluids.  Now seems to be tolerating diet.  Monitor urine output.  Mild hyperbilirubinemia Stable.  Outpatient monitoring.  Diabetes mellitus type 2 HbA1c 6.1 about a year ago.  Metformin on hold.  SSI.  Essential hypertension Patient's HCTZ and losartan had to be held due to hypokalemia and elevated creatinine.  Now hypertensive blood pressure readings noted.  Will resume her losartan.    History of stroke Continue aspirin and statin.  History of asthma Stable.  Free fluid in abdominal cavity This was noted on CT scan.  Nonspecific but could be related to her acute illness.  Will recommend repeating CT scan in a few weeks.  DVT Prophylaxis: Lovenox Code Status: Full code Family Communication: Discussed with patient.  No family at bedside Disposition Plan: Hopefully return home when improved     Medications: Scheduled:  aspirin EC  325 mg Oral Daily   atorvastatin  80 mg Oral QPM   enoxaparin (LOVENOX) injection  40 mg Subcutaneous Q24H   insulin aspart  0-15 Units Subcutaneous TID WC   mometasone-formoterol  2 puff Inhalation BID   potassium chloride  40 mEq Oral Once   Continuous:   EXB:[id number] **OR** acetaminophen, albuterol, [COMPLETED] meclizine **FOLLOWED BY** meclizine, ondansetron **OR** ondansetron (ZOFRAN) IV, senna-docusate  Antibiotics: Anti-infectives (From admission,  onward)    Start     Dose/Rate Route Frequency Ordered Stop   04/19/23 1230  piperacillin-tazobactam (ZOSYN) IVPB 3.375 g  Status:  Discontinued        3.375 g 12.5 mL/hr over 240 Minutes Intravenous Every 8 hours 04/19/23 1213 04/21/23 0945        Objective:  Vital Signs  Vitals:   04/21/23 2008 04/21/23 2048 04/22/23 0512 04/22/23 0835  BP:  (!) 144/73 (!) 162/76   Pulse:  (!) 52    Resp:  16 16   Temp:  98.3 F (36.8 C) 98 F (36.7 C)   TempSrc:  Oral Oral   SpO2: 98% 97% 100% 98%  Weight:      Height:        Intake/Output Summary (Last 24 hours) at 04/22/2023 1026 Last data filed at 04/22/2023 0512 Gross per 24 hour  Intake 877.09 ml  Output 2050 ml  Net -1172.91 ml   Filed Weights   04/18/23 2132 04/19/23 0823  Weight: 72 kg 71.7 kg    General appearance: Awake alert.  In no distress Resp: Clear to auscultation bilaterally.  Normal effort Cardio: S1-S2 is normal regular.  No S3-S4.  No rubs murmurs or bruit GI: Abdomen is soft.  Nontender nondistended.  Bowel sounds are present normal.  No masses organomegaly Extremities: No edema.  Full range of motion of lower extremities. Neurologic: Alert and oriented x3.  No focal neurological deficits.     Lab Results:  Data Reviewed: I have personally reviewed following labs and reports of the imaging studies  CBC: Recent Labs  Lab 04/18/23 2224 04/20/23 0330 04/21/23 0307  WBC 5.8 7.8 5.8  HGB 10.1* 12.4 11.2*  HCT 31.2* 38.9 35.5*  MCV 96.0 95.8 97.8  PLT 218 266 244    Basic Metabolic Panel: Recent Labs  Lab 04/18/23 2224 04/20/23 0330 04/21/23 0307 04/22/23 0704  NA 135 141 140 140  K 3.8 3.0* 2.7* 3.3*  CL 101 103 104 107  CO2 24 25 25 25   GLUCOSE 149* 100* 93 91  BUN 18 19 14 11   CREATININE 0.95 1.17* 1.04* 0.91  CALCIUM 8.9 9.3 8.8* 8.7*  MG  --  2.1 1.9 1.8  PHOS  --  3.5  --   --     GFR: Estimated Creatinine Clearance: 54.8 mL/min (by C-G formula based on SCr of 0.91 mg/dL).  Liver Function Tests: Recent Labs  Lab 04/18/23 2224 04/20/23 0330 04/21/23 0307  AST 30 27 26   ALT 11 10 13   ALKPHOS 52 52 45  BILITOT 1.3* 1.6* 1.4*  PROT 7.5 7.1 6.1*  ALBUMIN 4.2 3.9 3.4*    Recent Labs  Lab 04/18/23 2224  LIPASE  43    CBG: Recent Labs  Lab 04/21/23 0741 04/21/23 1201 04/21/23 1627 04/21/23 2138 04/22/23 0729  GLUCAP 67* 102* 103* 124* 80    Radiology Studies: CT ANGIO HEAD NECK W WO CM  Result Date: 04/22/2023 CLINICAL DATA:  Vertigo, central vertigo, rule out vascular issues. EXAM: CT ANGIOGRAPHY HEAD AND NECK WITH AND WITHOUT CONTRAST TECHNIQUE: Multidetector CT imaging of the head and neck was performed using the standard protocol during bolus administration of intravenous contrast. Multiplanar CT image reconstructions and MIPs were obtained to evaluate the vascular anatomy. Carotid stenosis measurements (when applicable) are obtained utilizing NASCET criteria, using the distal internal carotid diameter as the denominator. RADIATION DOSE REDUCTION: This exam was performed according to the departmental dose-optimization program which includes automated exposure  control, adjustment of the mA and/or kV according to patient size and/or use of iterative reconstruction technique. CONTRAST:  75mL OMNIPAQUE IOHEXOL 350 MG/ML SOLN COMPARISON:  Head CT 03/23/2022.  MRI brain 04/22/2023. FINDINGS: CT HEAD FINDINGS Brain: No acute hemorrhage. Unchanged moderate chronic small-vessel disease. Cortical gray-white differentiation is otherwise preserved. No hydrocephalus or extra-axial collection. No mass effect or midline shift. Vascular: No hyperdense vessel or unexpected calcification. Skull: No calvarial fracture or suspicious bone lesion. Skull base is unremarkable. Sinuses/Orbits: No acute finding. Other: None. Review of the MIP images confirms the above findings CTA NECK FINDINGS Aortic arch: Two-vessel arch configuration with common origin of the right brachiocephalic and left common carotid arteries. Arch vessel origins are patent. Right carotid system: No evidence of dissection, stenosis (50% or greater), or occlusion. 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 Left carotid system: No evidence of dissection, stenosis (50% or greater), or occlusion. Vertebral arteries: Left dominant. No evidence of dissection, stenosis (50% or greater), or occlusion. Skeleton: Multilevel cervical spondylosis, worst at C4-5 and C5-6, where there is at least mild spinal canal stenosis. Other neck: Unremarkable. Upper chest: Unremarkable. Review of the MIP images confirms the above findings CTA HEAD FINDINGS Anterior circulation: Calcified plaque along the carotid siphons without hemodynamically significant stenosis. The proximal ACAs and MCAs are patent without stenosis or aneurysm. Distal branches are symmetric. Posterior circulation: The right vertebral artery functionally terminates in PICA. Diminutive basilar artery. The SCAs, AICAs and PICAs are patent proximally. The PCAs are patent proximally without stenosis or aneurysm. Distal branches are symmetric. Venous sinuses: As permitted by contrast timing, patent. Anatomic variants: Persistent fetal origin of the bilateral PCAs with hypoplastic P1 segments. Review of the MIP images confirms the above findings IMPRESSION: 1. No acute intracranial abnormality. Unchanged moderate chronic small-vessel disease. 2. No large vessel occlusion, hemodynamically significant stenosis, or aneurysm in the head or neck. 3. 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 4. Multilevel cervical spondylosis, worst at C4-5 and C5-6, where there is at least mild spinal canal stenosis. Electronically Signed   By: Orvan Falconer M.D.   On: 04/22/2023 10:20   MR BRAIN WO CONTRAST  Result Date: 04/22/2023 CLINICAL DATA:  Headache, new onset. EXAM: MRI HEAD WITHOUT CONTRAST TECHNIQUE:  Multiplanar, multiecho pulse sequences of the brain and surrounding structures were obtained without intravenous contrast. COMPARISON:  Head CT 03/23/2022.  Brain MRI 02/15/2022 FINDINGS: Brain: No acute infarction, hemorrhage, hydrocephalus, extra-axial collection or mass lesion. Chronic small vessel ischemic gliosis in the cerebral white matter. Chronic lacunar infarcts in the bilateral thalamus, right pons, and deep cerebral white matter. Mild for age cerebral volume loss. There are numerous micro hemorrhages which are seen in both the peripheral brain (with area of superficial siderosis at the right parietal convexity) and in the deep brain, likely a combination of amyloid and hypertensive angiopathy. Vascular: Less robust basilar flow void on axial T2 weighted imaging when compared to before, more normal appearing on coronal T2 and possibly artifactual. Skull and upper cervical spine: No focal marrow lesion. Sinuses/Orbits: No significant finding. IMPRESSION: 1. No specific cause for headache. 2. Less well demonstrated basilar flow void when compared 07/02/2021, but no associated acute ischemia, correlate for posterior circulation ischemic  symptoms and consider CTA. 3. Chronic small vessel ischemia with multiple chronic infarcts. 4. Chronic microhemorrhages in areas seen with both chronic hypertension and amyloid. Electronically Signed   By: Tiburcio Pea M.D.   On: 04/22/2023 08:24       LOS: 2 days   Osvaldo Shipper  [facility] Pager on [email or web address]  04/22/2023, 10:26 AM

## 2023-04-22 NOTE — Discharge Instructions (Signed)
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  This will need outpatient monitoring and perhaps referral to neurosurgery. Please talk to your PCP regarding this finding.

## 2023-04-22 NOTE — Evaluation (Signed)
Physical Therapy Evaluation Patient Details Name: Krystal Vargas MRN: [id number] DOB: 1955/06/11 Today's Date: 04/22/2023  History of Present Illness  Pt is a 67 yo female admitted with nausea/vomiting and diarrehea.  +colitis. Pt also with dizziness since admission. MRI scheduled PMH: asthma, CVA, HTN, T2DM, fibromyalgia, endometrial CA, recent visit to audiologist with documented hearing loss.  Clinical Impression  Pt admitted with above diagnosis.  Pt  agreeable to PT today. Reports she is still dizzy or "spinning", this remain unchanged during session. Pt educated on slowing speed of movements, gaze stabilization. Able to amb  39' with RW and CGA, no overt LOB Recommend pt resume HHPT at d/c  Pt currently with functional limitations due to the deficits listed below (see PT Problem List). Pt will benefit from acute skilled PT to increase their independence and safety with mobility to allow discharge.           If plan is discharge home, recommend the following: Assistance with cooking/housework;Assist for transportation;Help with stairs or ramp for entrance   Can travel by private vehicle        Equipment Recommendations None recommended by PT  Recommendations for Other Services       Functional Status Assessment Patient has had a recent decline in their functional status and demonstrates the ability to make significant improvements in function in a reasonable and predictable amount of time.     Precautions / Restrictions Precautions Precautions: Fall Precaution Comments: Pt fell 3 days before nausea/vomiting began Restrictions Weight Bearing Restrictions: No      Mobility  Bed Mobility Overal bed mobility: Modified Independent             General bed mobility comments: Pt did not need assist to get to EOB, HO elevated    Transfers Overall transfer level: Needs assistance Equipment used: Rolling walker (2 wheels) Transfers: Sit to/from Stand Sit to Stand:  Supervision, Contact guard assist           General transfer comment: cues for hand placement for sit to stand, pt self corrects to lower self to bed    Ambulation/Gait Ambulation/Gait assistance: Contact guard assist Gait Distance (Feet): 70 Feet Assistive device: Rolling walker (2 wheels) Gait Pattern/deviations: Step-through pattern, Decreased stride length       General Gait Details: slower guarded gait, no overt LOB, reliant on RW. pt reports dizziness "spinning" throughout distance, does not worsen  Stairs            Wheelchair Mobility     Tilt Bed    Modified Rankin (Stroke Patients Only)       Balance   Sitting-balance support: Feet supported Sitting balance-Leahy Scale: Good     Standing balance support: Bilateral upper extremity supported, Reliant on assistive device for balance Standing balance-Leahy Scale: Poor Standing balance comment: reports "spinning". reliant on device for static stand and gait                             Pertinent Vitals/Pain Pain Assessment Pain Assessment: No/denies pain    Home Living Family/patient expects to be discharged to:: Private residence Living Arrangements: Spouse/significant other;Other (Comment) Available Help at Discharge: Friend(s);Available PRN/intermittently Type of Home: Apartment Home Access: Elevator       Home Layout: One level Home Equipment: Rollator (4 wheels) Additional Comments: Pt has been receiving HHPT; pt cares for spouse when has dementia.  one local son (pt states he does not normally assist,  he works full time)    Prior Function Prior Level of Function : Independent/Modified Independent             Mobility Comments: uses rollator. Does not drive ADLs Comments: does not drive. Pt does cook/clean/takes care of husband with dementia     Extremity/Trunk Assessment   Upper Extremity Assessment Upper Extremity Assessment: Defer to OT evaluation;Overall [facility] for  tasks assessed    Lower Extremity Assessment Lower Extremity Assessment: Overall WFL for tasks assessed       Communication   Communication Communication: No apparent difficulties  Cognition Arousal: Alert Behavior During Therapy: WFL for tasks assessed/performed                                   General Comments: appears cognitively intact        General Comments      Exercises     Assessment/Plan    PT Assessment Patient needs continued PT services  PT Problem List Decreased activity tolerance;Decreased balance;Decreased mobility       PT Treatment Interventions DME instruction;Gait training;Functional mobility training;Therapeutic activities;Therapeutic exercise;Patient/family education    PT Goals (Current goals can be found in the Care Plan section)  Acute Rehab PT Goals Patient Stated Goal: feel better PT Goal Formulation: With patient Time For Goal Achievement: 04/29/23 Potential to Achieve Goals: Good    Frequency Min 1X/week     Co-evaluation               AM-PAC PT "6 Clicks" Mobility  Outcome Measure Help needed turning from your back to your side while in a flat bed without using bedrails?: None Help needed moving from lying on your back to sitting on the side of a flat bed without using bedrails?: None Help needed moving to and from a bed to a chair (including a wheelchair)?: A Little Help needed standing up from a chair using your arms (e.g., wheelchair or bedside chair)?: A Little Help needed to walk in hospital room?: A Little Help needed climbing 3-5 steps with a railing? : A Little 6 Click Score: 20    End of Session Equipment Utilized During Treatment: Gait belt Activity Tolerance: Patient tolerated treatment well Patient left: with call bell/phone within reach;in bed (no alarm on arrival)   PT Visit Diagnosis: Other abnormalities of gait and mobility (R26.89);Difficulty in walking, not elsewhere classified  (R26.2)    Time: 1205-1220 PT Time Calculation (min) (ACUTE ONLY): 15 min   Charges:   PT Evaluation $PT Eval Low Complexity: 1 Low   PT General Charges $$ ACUTE PT VISIT: 1 Visit         Macey Wurtz, PT  Acute Rehab Dept [facility]) [phone]  04/22/2023   [facility] 04/22/2023, 12:30 PM

## 2023-04-22 NOTE — Plan of Care (Signed)
  Problem: Activity: Goal: Risk for activity intolerance will decrease Outcome: Progressing   Problem: Pain Management: Goal: General experience of comfort will improve Outcome: Progressing   Problem: Safety: Goal: Ability to remain free from injury will improve Outcome: Progressing

## 2023-04-23 DIAGNOSIS — K529 Noninfective gastroenteritis and colitis, unspecified: Secondary | ICD-10-CM | POA: Diagnosis not present

## 2023-04-23 DIAGNOSIS — R1110 Vomiting, unspecified: Secondary | ICD-10-CM | POA: Diagnosis not present

## 2023-04-23 DIAGNOSIS — R42 Dizziness and giddiness: Secondary | ICD-10-CM | POA: Diagnosis not present

## 2023-04-23 LAB — BASIC METABOLIC PANEL
Anion gap: 5 (ref 5–15)
BUN: 13 mg/dL (ref 8–23)
CO2: 24 mmol/L (ref 22–32)
Calcium: 8.6 mg/dL — ABNORMAL LOW (ref 8.9–10.3)
Chloride: 108 mmol/L (ref 98–111)
Creatinine, Ser: 0.9 mg/dL (ref 0.44–1.00)
GFR, Estimated: >60 mL/min (ref >60)
Glucose, Bld: 131 mg/dL — ABNORMAL HIGH (ref 70–99)
Potassium: 3.5 mmol/L (ref 3.5–5.1)
Sodium: 137 mmol/L (ref 135–145)

## 2023-04-23 LAB — GLUCOSE, CAPILLARY
Glucose-Capillary: 82 mg/dL (ref 70–99)
Glucose-Capillary: 93 mg/dL (ref 70–99)

## 2023-04-23 MED ORDER — MECLIZINE HCL 25 MG PO TABS: 25.0000 mg | ORAL_TABLET | Freq: Three times a day (TID) | ORAL | 0 refills | Status: AC | PRN

## 2023-04-23 NOTE — Care Management Important Message (Signed)
Important Message  Patient Details IM Letter given. Name: Krystal Vargas MRN: [id number] Date of Birth: 04-18-1956   Important Message Given:  Yes - Medicare IM     [name] 04/23/2023, 11:09 AM

## 2023-04-23 NOTE — TOC Transition Note (Signed)
Transition of Care [facility]) - CM/SW Discharge Note   Patient Details  Name: Krystal Vargas MRN: [id number] Date of Birth: December 15, 1955  Transition of [facility]) CM/SW Contact:  Amada Jupiter, LCSW Phone Number: 04/23/2023, 11:41 AM   Clinical Narrative:     Met with pt who is aware/ agreeable plan for dc home today.  HHPT/OT orders in place and pt notes she is active with [facility] and would like to stay with them.  Have confirmed with [facility] and they will resume [facility] services.  Pt asking about transportation assistance and have provided her with map for [facility] bus and added additional info to AVS.  No further TOC needs.  Final next level of care: Home w Home Health Services Barriers to Discharge: No Barriers Identified   Patient Goals and CMS Choice      Discharge Placement                         Discharge Plan and Services Additional resources added to the After Visit Summary for                  DME Arranged: N/A DME Agency: NA       HH Arranged: PT, OT HH Agency: [facility] Date [facility] Agency Contacted: 04/23/23 Time HH Agency Contacted: [phone] Representative spoke with at [facility] Agency: Jerilynn Som  Social Determinants of Health (SDOH) Interventions SDOH Screenings   Food Insecurity: Patient Declined (04/19/2023)  Housing: Patient Declined (04/19/2023)  Transportation Needs: Patient Declined (04/19/2023)  Utilities: Patient Declined (04/19/2023)  Financial Resource Strain: Low Risk  (06/10/2022)   Received from [facility], [facility]  Physical Activity: Insufficiently Active (06/10/2022)   Received from [facility], [facility]  Social Connections: Socially Isolated (06/10/2022)   Received from [facility], [facility]  Stress: Stress Concern Present (06/10/2022)   Received from [facility], [facility]  Tobacco Use: Medium Risk (04/18/2023)     Readmission Risk Interventions    04/23/2023   11:40 AM  Readmission Risk Prevention Plan   Transportation Screening Complete  PCP or Specialist Appt within 5-7 Days Complete  Home Care Screening Complete  Medication Review (RN CM) Complete

## 2023-04-23 NOTE — Progress Notes (Signed)
Discharge package printed and instructions given to pt. Pt verbalizes understanding. 

## 2023-04-23 NOTE — Plan of Care (Signed)
  Problem: Activity: Goal: Risk for activity intolerance will decrease Outcome: Progressing   Problem: Pain Management: Goal: General experience of comfort will improve Outcome: Progressing   Problem: Safety: Goal: Ability to remain free from injury will improve Outcome: Progressing

## 2023-04-23 NOTE — Discharge Summary (Signed)
Physician Discharge Summary  Lakiska Feimster ZOX:[id number] DOB: 1955/07/29 DOA: 04/18/2023  PCP: Patient, No Pcp Per  Admit date: 04/18/2023 Discharge date: 04/23/2023  2:57 PM  Admitted From: Home Disposition: Home Recommendations for Outpatient Follow-up:  Follow up with PCP in 1 week Outpatient follow-up with neurology and ENT in 2 to 3 weeks Consider referral to neurosurgery for carotid body paraganglioma Check blood pressure, CMP and CBC at follow-up Please follow up on the following pending results: None  Home Health: [facility] PT/OT Equipment/Devices: Patient has appropriate DME.  Discharge Condition: Stable CODE STATUS: Full code   Hospital course 67 year old F with PMH of asthma, CVA, HTN, HLD, T2DM, bradycardia, fibromyalgia and endometrial cancer presented to the ED for evaluation of intractable nausea and vomiting.  Symptoms started suddenly.  She also had a few episodes of diarrhea.  CT scan raise concern for mild colitis but admitted with working diagnosis of acute gastroenteritis.  On further questioning, patient reported feeling "woozy" that she describes as lightheadedness not spinning.  Symptoms started a day after she hit the back of her head against the door of the cabinet.  She denies LOC at that time.  She also  reports viral illness about 2 weeks prior.  MRI brain did not show acute finding.  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  Neuroexam was intact except for left beating nystagmus.  She has no cerebellar deficit on exam.  Orthostatic vitals negative.  "Wooziness" improved with hydration and as needed meclizine.  On the day of discharge, patient's symptoms improved significantly.  Tolerating regular diet.  She is discharged with home health PT/OT for outpatient follow-up with PCP, neurology and ENT.  She may continue meclizine as needed.  Discontinued HCTZ on discharge due to risk of dehydration.  See  individual problem list below for more.   Problems addressed during this hospitalization Acute gastroenteritis-likely due to viral illness.  She reports viral illness 2 weeks preceding her presentation.  Symptoms resolved.  Discontinued HCTZ.  Encouraged oral hydration.   Dizziness: Patient describes her symptoms as "wooziness and lightheadedness".  Denies spinning.  Exam is reassuring except for left beating nystagmus.  MRI brain without acute finding.  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  She has no cerebellar deficit.  Orthostatic vitals negative.  Her symptoms could be due to recent viral illness and possible dehydration.  I doubt this is concussion from hitting her head.  Given improvement in her symptoms, will not start steroid. -Patient to continue meclizine as needed -Discontinued HCTZ -HH PT/OT -Outpatient follow-up with PCP, ENT and neurology.  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 -Consider outpatient follow-up with neurosurgery    Hypokalemia: Resolved -HCTZ discontinued   Elevated creatinine: Improved -HCTZ discontinued   Mild hyperbilirubinemia: Improved -Recheck CMP at follow-up   DM-2: A1c 5.9%. -Can continue home metformin   Essential hypertension -Continue losartan, Imdur -Discontinued HCTZ   History of stroke: On full dose aspirin due to "recurrent stroke" -Continue aspirin and statin.   History of asthma -Continue home LABA/ICS with as needed albuterol     Time spent 35 minutes  Vital signs Vitals:   04/23/23 0854 04/23/23 1011 04/23/23 1013 04/23/23 1015  BP:  130/70 (!) 159/70 (!) 159/75  Pulse:  (!) 57 (!) 57 64  Temp:  97.8 F (36.6 C)    Resp:  18 17 18  Height:      Weight:      SpO2: 97% 100% 100% 100%  TempSrc:  Oral    BMI (Calculated):         Discharge exam  GENERAL: No apparent distress.  Nontoxic. HEENT: MMM.  Vision and hearing grossly  intact.  NECK: Supple.  No apparent JVD.  RESP:  No IWOB.  Fair aeration bilaterally. CVS:  RRR. Heart sounds normal.  ABD/GI/GU: BS+. Abd soft, NTND.  MSK/EXT:  Moves extremities. No apparent deformity. No edema.  SKIN: no apparent skin lesion or wound NEURO: Awake, alert and oriented appropriately. Speech clear. Cranial nerves II-XII intact except for left beating nystagmus. Motor 5/5 in all muscle groups of UE and LE bilaterally, Normal tone. Light sensation intact in all dermatomes of upper and lower ext bilaterally. Patellar reflex symmetric.  No pronator drift.  Finger to nose intact. PSYCH: Calm. Normal affect.   Discharge Instructions Discharge Instructions     Diet - low sodium heart healthy   Complete by: As directed    Discharge instructions   Complete by: As directed    It has been a pleasure taking care of you!  You were hospitalized due to nausea, vomiting, diarrhea and dizziness.  Your symptoms are likely due to viral illness.  You may continue taking meclizine as needed for vertigo.  Follow-up with your primary care doctor in 1 to 2 weeks or sooner if needed.  Also recommend follow-up with your neurologist and ENT in 2 to 3 weeks.  Please review your new medication list and the directions on your medications before you take them.   Take care,   Increase activity slowly   Complete by: As directed    No wound care   Complete by: As directed       Allergies as of 04/23/2023       Reactions   Amlodipine Other (See Comments)   Other reaction(s): Other (See Comments) sob sob, sob   Metoprolol Other (See Comments)   sob sob, sob   Prednisone Other (See Comments)   High blood sugar High blood sugar, High blood sugar   Doxycycline    Chest Pain   Hydralazine Other (See Comments)   Pt states she does not function on this        Medication List     STOP taking these medications    hydrochlorothiazide 25 MG tablet Commonly known as: HYDRODIURIL        TAKE these medications    acetaminophen 650 MG CR tablet Commonly known as: Tylenol 8 Hour Take 1 tablet (650 mg total) by mouth every 8 (eight) hours as needed for pain.   albuterol (2.5 MG/3ML) 0.083% nebulizer solution Commonly known as: PROVENTIL Take 2.5 mg by nebulization every 4 (four) hours as needed for wheezing or shortness of breath.   albuterol 108 (90 Base) MCG/ACT inhaler Commonly known as: VENTOLIN HFA Inhale 2 puffs into the lungs every 6 (six) hours as needed for wheezing or shortness of breath.   aspirin EC 325 MG tablet Take 1 tablet (325 mg total) by mouth daily.   atorvastatin 80 MG tablet Commonly known as: LIPITOR Take 1 tablet (80 mg total) by mouth every evening.   Fish Oil 1000 MG Caps Take 1,000 mg by mouth daily.   isosorbide mononitrate 30 MG 24 hr tablet Commonly known as: IMDUR Take 30 mg by mouth in the morning and at bedtime.   losartan 100 MG tablet Commonly known as:  COZAAR Take 100 mg by mouth daily.   meclizine 25 MG tablet Commonly known as: ANTIVERT Take 1 tablet (25 mg total) by mouth 3 (three) times daily as needed for dizziness.   metFORMIN 500 MG tablet Commonly known as: GLUCOPHAGE Take 500 mg by mouth 2 (two) times daily.   Multiple Vitamins/Womens tablet Take 1 tablet by mouth daily.   nitroGLYCERIN 0.4 MG SL tablet Commonly known as: NITROSTAT Place 1 tablet (0.4 mg total) under the tongue every 5 (five) minutes as needed for chest pain.   OneTouch Verio test strip Generic drug: glucose blood USE AS DIRECTED TO CHECK  BLOOD SUGAR TWICE DAILY   OneTouch Verio test strip Generic drug: glucose blood 1 each 2 (two) times daily.   SYSTANE OP Place 1 drop into both eyes daily.   Wixela Inhub 250-50 MCG/ACT Aepb Generic drug: fluticasone-salmeterol Inhale 1 puff into the lungs as needed (for wheezing and shortness of breath). What changed: Another medication with the same name was removed. Continue taking this  medication, and follow the directions you see here.        Consultations: None  Procedures/Studies:   CT ANGIO HEAD NECK W WO CM  Result Date: 04/22/2023 CLINICAL DATA:  Vertigo, central vertigo, rule out vascular issues. EXAM: CT ANGIOGRAPHY HEAD AND NECK WITH AND WITHOUT CONTRAST TECHNIQUE: Multidetector CT imaging of the head and neck was performed using the standard protocol during bolus administration of intravenous contrast. Multiplanar CT image reconstructions and MIPs were obtained to evaluate the vascular anatomy. Carotid stenosis measurements (when applicable) are obtained utilizing NASCET criteria, using the distal internal carotid diameter as the denominator. RADIATION DOSE REDUCTION: This exam was performed according to the departmental dose-optimization program which includes automated exposure control, adjustment of the mA and/or kV according to patient size and/or use of iterative reconstruction technique. CONTRAST:  75mL OMNIPAQUE IOHEXOL 350 MG/ML SOLN COMPARISON:  Head CT 03/23/2022.  MRI brain 04/22/2023. FINDINGS: CT HEAD FINDINGS Brain: No acute hemorrhage. Unchanged moderate chronic small-vessel disease. Cortical gray-white differentiation is otherwise preserved. No hydrocephalus or extra-axial collection. No mass effect or midline shift. Vascular: No hyperdense vessel or unexpected calcification. Skull: No calvarial fracture or suspicious bone lesion. Skull base is unremarkable. Sinuses/Orbits: No acute finding. Other: None. Review of the MIP images confirms the above findings CTA NECK FINDINGS Aortic arch: Two-vessel arch configuration with common origin of the right brachiocephalic and left common carotid arteries. Arch vessel origins are patent. Right carotid system: No evidence of dissection, stenosis (50% or greater), or occlusion. 8 mm enhancing lesion at the right carotid bifurcation between the right internal and external carotid arteries, favored to represent a right  carotid body paraganglioma (axial image 159 series 11, sagittal image 154 series 13). Left carotid system: No evidence of dissection, stenosis (50% or greater), or occlusion. Vertebral arteries: Left dominant. No evidence of dissection, stenosis (50% or greater), or occlusion. Skeleton: Multilevel cervical spondylosis, worst at C4-5 and C5-6, where there is at least mild spinal canal stenosis. Other neck: Unremarkable. Upper chest: Unremarkable. Review of the MIP images confirms the above findings CTA HEAD FINDINGS Anterior circulation: Calcified plaque along the carotid siphons without hemodynamically significant stenosis. The proximal ACAs and MCAs are patent without stenosis or aneurysm. Distal branches are symmetric. Posterior circulation: The right vertebral artery functionally terminates in PICA. Diminutive basilar artery. The SCAs, AICAs and PICAs are patent proximally. The PCAs are patent proximally without stenosis or aneurysm. Distal branches are symmetric. Venous sinuses: As permitted by  contrast timing, patent. Anatomic variants: Persistent fetal origin of the bilateral PCAs with hypoplastic P1 segments. Review of the MIP images confirms the above findings IMPRESSION: 1. No acute intracranial abnormality. Unchanged moderate chronic small-vessel disease. 2. No large vessel occlusion, hemodynamically significant stenosis, or aneurysm in the head or neck. 3. 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 4. Multilevel cervical spondylosis, worst at C4-5 and C5-6, where there is at least mild spinal canal stenosis. Electronically Signed   By: Orvan Falconer M.D.   On: 04/22/2023 10:20   MR BRAIN WO CONTRAST  Result Date: 04/22/2023 CLINICAL DATA:  Headache, new onset. EXAM: MRI HEAD WITHOUT CONTRAST TECHNIQUE: Multiplanar, multiecho pulse sequences of the brain and  surrounding structures were obtained without intravenous contrast. COMPARISON:  Head CT 03/23/2022.  Brain MRI 02/15/2022 FINDINGS: Brain: No acute infarction, hemorrhage, hydrocephalus, extra-axial collection or mass lesion. Chronic small vessel ischemic gliosis in the cerebral white matter. Chronic lacunar infarcts in the bilateral thalamus, right pons, and deep cerebral white matter. Mild for age cerebral volume loss. There are numerous micro hemorrhages which are seen in both the peripheral brain (with area of superficial siderosis at the right parietal convexity) and in the deep brain, likely a combination of amyloid and hypertensive angiopathy. Vascular: Less robust basilar flow void on axial T2 weighted imaging when compared to before, more normal appearing on coronal T2 and possibly artifactual. Skull and upper cervical spine: No focal marrow lesion. Sinuses/Orbits: No significant finding. IMPRESSION: 1. No specific cause for headache. 2. Less well demonstrated basilar flow void when compared 07/02/2021, but no associated acute ischemia, correlate for posterior circulation ischemic symptoms and consider CTA. 3. Chronic small vessel ischemia with multiple chronic infarcts. 4. Chronic microhemorrhages in areas seen with both chronic hypertension and amyloid. Electronically Signed   By: Tiburcio Pea M.D.   On: 04/22/2023 08:24   CT ABDOMEN PELVIS W CONTRAST  Result Date: 04/19/2023 CLINICAL DATA:  67 year old female with abdominal pain. EXAM: CT ABDOMEN AND PELVIS WITH CONTRAST TECHNIQUE: Multidetector CT imaging of the abdomen and pelvis was performed using the standard protocol following bolus administration of intravenous contrast. RADIATION DOSE REDUCTION: This exam was performed according to the departmental dose-optimization program which includes automated exposure control, adjustment of the mA and/or kV according to patient size and/or use of iterative reconstruction technique. CONTRAST:   OMNIPAQUE IOHEXOL 300 MG/ML  SOLN COMPARISON:  CT Abdomen and Pelvis 08/27/2022. FINDINGS: Lower chest: Lower lung volumes. Minor lung base atelectasis. No pericardial or pleural effusion. Hepatobiliary: Posterior right hepatic dome hemangioma is stable from 03/23/2022 CT (no follow-up imaging recommended). Negative gallbladder. Pancreas: Negative. Spleen: Negative. Adrenals/Urinary Tract: Normal adrenal glands. Nonobstructed kidneys with symmetric renal enhancement, contrast excretion. Decompressed ureters. Unremarkable bladder. Numerous pelvic phleboliths. Stomach/Bowel: Mostly decompressed rectosigmoid colon in the pelvis. Redundant sigmoid in the left lower quadrant partially contains gas. Diverticulosis throughout the descending colon and at the splenic flexure. Those segments are decompressed. No convincing inflammation. Similar decompressed appearance of the transverse colon. Diverticulosis at the hepatic flexure. Decompressed right colon. Elongated but normal appendix on coronal image 80 just below the liver margin, tracking cephalad from the cecum which is decompressed. Decompressed terminal ileum. No dilated small bowel. Decompressed stomach and duodenum. No free air. No free fluid or mesenteric inflammation identified in the abdomen. Small fat containing umbilical hernia. No herniated bowel. Vascular/Lymphatic: Aortoiliac calcified  atherosclerosis. Normal caliber abdominal aorta. Major arterial structures remain patent. Grossly patent portal venous system. No lymphadenopathy identified. Reproductive: Surgically absent uterus. Diminutive or absent ovaries. Numerous pelvic phleboliths. Other: Small volume of simple density pelvis free fluid on series 2, image 59. Musculoskeletal: Advanced lumbar facet arthropathy. No acute osseous abnormality identified. Levoconvex scoliosis today appears in part to be positional artifact. IMPRESSION: 1. Small volume of simple free fluid in the pelvis is abnormal in this  age group but nonspecific. Normal appendix. The colon is redundant but mostly decompressed. Mild colitis would be difficult to exclude. No evidence of bowel obstruction. Intermittent large bowel diverticulosis but no focal mesenteric inflammation identified. 2.  Aortic Atherosclerosis (ICD10-I70.0). Electronically Signed   By: Odessa Fleming M.D.   On: 04/19/2023 06:57       The results of significant diagnostics from this hospitalization (including imaging, microbiology, ancillary and laboratory) are listed below for reference.     Microbiology: No results found for this or any previous visit (from the past 240 hour(s)).   Labs:  CBC: Recent Labs  Lab 04/18/23 2224 04/20/23 0330 04/21/23 0307  WBC 5.8 7.8 5.8  HGB 10.1* 12.4 11.2*  HCT 31.2* 38.9 35.5*  MCV 96.0 95.8 97.8  PLT 218 266 244   BMP &GFR Recent Labs  Lab 04/18/23 2224 04/20/23 0330 04/21/23 0307 04/22/23 0704 04/23/23 0327  NA 135 141 140 140 137  K 3.8 3.0* 2.7* 3.3* 3.5  CL 101 103 104 107 108  CO2 24 25 25 25 24   GLUCOSE 149* 100* 93 91 131*  BUN 18 19 14 11 13   CREATININE 0.95 1.17* 1.04* 0.91 0.90  CALCIUM 8.9 9.3 8.8* 8.7* 8.6*  MG  --  2.1 1.9 1.8  --   PHOS  --  3.5  --   --   --    Estimated Creatinine Clearance: 55.4 mL/min (by C-G formula based on SCr of 0.9 mg/dL). Liver & Pancreas: Recent Labs  Lab 04/18/23 2224 04/20/23 0330 04/21/23 0307  AST 30 27 26   ALT 11 10 13   ALKPHOS 52 52 45  BILITOT 1.3* 1.6* 1.4*  PROT 7.5 7.1 6.1*  ALBUMIN 4.2 3.9 3.4*   Recent Labs  Lab 04/18/23 2224  LIPASE 43   No results for input(s): "AMMONIA" in the last 168 hours. Diabetic: No results for input(s): "HGBA1C" in the last 72 hours. Recent Labs  Lab 04/22/23 1136 04/22/23 1623 04/22/23 2302 04/23/23 0758 04/23/23 1139  GLUCAP 147* 229* 100* 82 93   Cardiac Enzymes: No results for input(s): "CKTOTAL", "CKMB", "CKMBINDEX", "TROPONINI" in the last 168 hours. No results for input(s): "PROBNP"  in the last 8760 hours. Coagulation Profile: No results for input(s): "INR", "PROTIME" in the last 168 hours. Thyroid Function Tests: No results for input(s): "TSH", "T4TOTAL", "FREET4", "T3FREE", "THYROIDAB" in the last 72 hours. Lipid Profile: No results for input(s): "CHOL", "HDL", "LDLCALC", "TRIG", "CHOLHDL", "LDLDIRECT" in the last 72 hours. Anemia Panel: No results for input(s): "VITAMINB12", "FOLATE", "FERRITIN", "TIBC", "IRON", "RETICCTPCT" in the last 72 hours. Urine analysis:    Component Value Date/Time   COLORURINE STRAW (A) 08/27/2022 1714   APPEARANCEUR CLEAR 08/27/2022 1714   LABSPEC 1.012 08/27/2022 1714   PHURINE 8.0 08/27/2022 1714   GLUCOSEU NEGATIVE 08/27/2022 1714   HGBUR NEGATIVE 08/27/2022 1714   BILIRUBINUR NEGATIVE 08/27/2022 1714   KETONESUR NEGATIVE 08/27/2022 1714   PROTEINUR NEGATIVE 08/27/2022 1714   NITRITE NEGATIVE 08/27/2022 1714   LEUKOCYTESUR NEGATIVE 08/27/2022 1714  Sepsis Labs: Invalid input(s): "PROCALCITONIN", "LACTICIDVEN"   SIGNED:  Almon Hercules, MD  [facility] 04/23/2023, 4:03 PM

## 2023-09-17 ENCOUNTER — Ambulatory Visit: Admitting: Podiatry

## 2023-09-25 ENCOUNTER — Ambulatory Visit: Admitting: Podiatry

## 2023-10-09 ENCOUNTER — Other Ambulatory Visit: Payer: Self-pay | Admitting: Nurse Practitioner

## 2023-10-09 DIAGNOSIS — R109 Unspecified abdominal pain: Secondary | ICD-10-CM

## 2023-10-09 DIAGNOSIS — R748 Abnormal levels of other serum enzymes: Secondary | ICD-10-CM

## 2023-10-20 ENCOUNTER — Encounter: Payer: Self-pay | Admitting: Podiatry

## 2023-10-20 ENCOUNTER — Ambulatory Visit: Admitting: Podiatry

## 2023-10-20 DIAGNOSIS — M79672 Pain in left foot: Secondary | ICD-10-CM | POA: Diagnosis not present

## 2023-10-20 DIAGNOSIS — M79671 Pain in right foot: Secondary | ICD-10-CM

## 2023-10-20 DIAGNOSIS — B351 Tinea unguium: Secondary | ICD-10-CM

## 2023-10-20 NOTE — Progress Notes (Signed)
 Patient presents for evaluation and treatment of tenderness and some redness around nails feet.  Tenderness around toes with walking and wearing shoes.  Physical exam:  General appearance: Alert, pleasant, and in no acute distress.  Vascular: Pedal pulses: DP 2/4 B/L, PT 0/4 B/L. mild edema lower legs bilaterally  Neurological:  No burning or paresthesias noted  Dermatologic:  Nails thickened, disfigured, discolored 1-5 BL with subungual debris.  Redness and hypertrophic nail folds along nail folds bilaterally but no signs of drainage or infection.  Musculoskeletal:     Diagnosis: 1. Painful onychomycotic nails 1 through 5 bilaterally. 2. Pain toes 1 through 5 bilaterally.  Plan: Debrided onychomycotic nails 1 through 5 bilaterally.  Return 3 months

## 2023-10-22 ENCOUNTER — Inpatient Hospital Stay: Admission: RE | Admit: 2023-10-22 | Source: Ambulatory Visit

## 2023-10-30 ENCOUNTER — Ambulatory Visit
Admission: RE | Admit: 2023-10-30 | Discharge: 2023-10-30 | Disposition: A | Discharge status: Home / Self Care | Source: Ambulatory Visit | Attending: Nurse Practitioner

## 2023-10-30 DIAGNOSIS — R109 Unspecified abdominal pain: Secondary | ICD-10-CM

## 2023-10-30 DIAGNOSIS — R748 Abnormal levels of other serum enzymes: Secondary | ICD-10-CM

## 2023-10-30 MED ORDER — IOPAMIDOL (ISOVUE-300) INJECTION 61%
100.0000 mL | Freq: Once | INTRAVENOUS | Status: AC | PRN
  Administered 2023-10-30: 100 mL via INTRAVENOUS

## 2023-11-12 ENCOUNTER — Emergency Department (HOSPITAL_COMMUNITY)
Admission: EM | Admit: 2023-11-12 | Discharge: 2023-11-12 | Disposition: A | Discharge status: Home / Self Care | Attending: Emergency Medicine | Admitting: Emergency Medicine

## 2023-11-12 ENCOUNTER — Other Ambulatory Visit: Payer: Self-pay

## 2023-11-12 ENCOUNTER — Encounter (HOSPITAL_COMMUNITY): Payer: Self-pay | Admitting: Emergency Medicine

## 2023-11-12 ENCOUNTER — Emergency Department (HOSPITAL_COMMUNITY)

## 2023-11-12 DIAGNOSIS — Z7982 Long term (current) use of aspirin: Secondary | ICD-10-CM | POA: Insufficient documentation

## 2023-11-12 DIAGNOSIS — Z8542 Personal history of malignant neoplasm of other parts of uterus: Secondary | ICD-10-CM | POA: Diagnosis not present

## 2023-11-12 DIAGNOSIS — R062 Wheezing: Secondary | ICD-10-CM | POA: Insufficient documentation

## 2023-11-12 DIAGNOSIS — Z7951 Long term (current) use of inhaled steroids: Secondary | ICD-10-CM | POA: Diagnosis not present

## 2023-11-12 DIAGNOSIS — J45901 Unspecified asthma with (acute) exacerbation: Secondary | ICD-10-CM

## 2023-11-12 DIAGNOSIS — Z7984 Long term (current) use of oral hypoglycemic drugs: Secondary | ICD-10-CM | POA: Diagnosis not present

## 2023-11-12 DIAGNOSIS — R0602 Shortness of breath: Secondary | ICD-10-CM | POA: Diagnosis present

## 2023-11-12 DIAGNOSIS — E119 Type 2 diabetes mellitus without complications: Secondary | ICD-10-CM | POA: Diagnosis not present

## 2023-11-12 DIAGNOSIS — Z8673 Personal history of transient ischemic attack (TIA), and cerebral infarction without residual deficits: Secondary | ICD-10-CM | POA: Diagnosis not present

## 2023-11-12 LAB — BASIC METABOLIC PANEL WITH GFR
Anion gap: 8 (ref 5–15)
BUN: 25 mg/dL — ABNORMAL HIGH (ref 8–23)
CO2: 27 mmol/L (ref 22–32)
Calcium: 9.6 mg/dL (ref 8.9–10.3)
Chloride: 106 mmol/L (ref 98–111)
Creatinine, Ser: 1.1 mg/dL — ABNORMAL HIGH (ref 0.44–1.00)
GFR, Estimated: 54 mL/min — ABNORMAL LOW (ref >60)
Glucose, Bld: 90 mg/dL (ref 70–99)
Potassium: 4.3 mmol/L (ref 3.5–5.1)
Sodium: 141 mmol/L (ref 135–145)

## 2023-11-12 LAB — CBC
HCT: 34.9 % — ABNORMAL LOW (ref 36.0–46.0)
Hemoglobin: 11.1 g/dL — ABNORMAL LOW (ref 12.0–15.0)
MCH: 31 pg (ref 26.0–34.0)
MCHC: 31.8 g/dL (ref 30.0–36.0)
MCV: 97.5 fL (ref 80.0–100.0)
Platelets: 240 10*3/uL (ref 150–400)
RBC: 3.58 MIL/uL — ABNORMAL LOW (ref 3.87–5.11)
RDW: 14.6 % (ref 11.5–15.5)
WBC: 5.5 10*3/uL (ref 4.0–10.5)
nRBC: 0 % (ref 0.0–0.2)

## 2023-11-12 LAB — BRAIN NATRIURETIC PEPTIDE: B Natriuretic Peptide: 89.8 pg/mL (ref 0.0–100.0)

## 2023-11-12 LAB — TROPONIN I (HIGH SENSITIVITY): Troponin I (High Sensitivity): 3 ng/L (ref <18)

## 2023-11-12 MED ORDER — IPRATROPIUM-ALBUTEROL 0.5-2.5 (3) MG/3ML IN SOLN
3.0000 mL | Freq: Once | RESPIRATORY_TRACT | Status: AC
  Administered 2023-11-12: 3 mL via RESPIRATORY_TRACT
  Filled 2023-11-12: qty 3

## 2023-11-12 MED ORDER — PREDNISONE 10 MG (21) PO TBPK: ORAL_TABLET | Freq: Every day | ORAL | 0 refills | Status: DC

## 2023-11-12 NOTE — ED Provider Notes (Signed)
 Patient signed out to myself by Derrek Angle, PA-C pending labs and reassessment.  Please refer to their note for full HPI, ROS, PE, and MDM.  Patient with a past medical history significant for asthma and shortness of breath presents today for shortness of breath that began yesterday.  Patient has been using her albuterol  inhaler with little relief.  Patient denies fever, chills, cough, congestion, chest pain, or any other complaints at this time.  Duoneb, sterpred outpt Reassess, likely d/c  Physical Exam  BP 137/80   Pulse (!) 54   Temp 97.7 F (36.5 C) (Oral)   Resp 14   LMP 05/16/2012   SpO2 100%   Physical Exam Vitals and nursing note reviewed.  Constitutional:      General: She is not in acute distress.    Appearance: She is well-developed. She is not ill-appearing.  HENT:     Head: Normocephalic and atraumatic.  Eyes:     Conjunctiva/sclera: Conjunctivae normal.  Cardiovascular:     Rate and Rhythm: Normal rate and regular rhythm.     Pulses: Normal pulses.     Heart sounds: Normal heart sounds. No murmur heard. Pulmonary:     Effort: Pulmonary effort is normal. No accessory muscle usage or respiratory distress.     Breath sounds: Normal breath sounds. No wheezing or rhonchi.  Abdominal:     Palpations: Abdomen is soft.     Tenderness: There is no abdominal tenderness.  Musculoskeletal:        General: No swelling.     Cervical back: Neck supple.  Skin:    General: Skin is warm and dry.     Capillary Refill: Capillary refill takes less than 2 seconds.  Neurological:     General: No focal deficit present.     Mental Status: She is alert and oriented to person, place, and time.  Psychiatric:        Mood and Affect: Mood normal.     Procedures  Procedures  ED Course / MDM   Clinical Course as of 11/12/23 1701  Wed Nov 12, 2023  1511 Hemoglobin(!): 11.1 [OZ]    Clinical Course User Index [OZ] Angle Legrand LABOR, PA-C   Medical Decision Making Amount  and/or Complexity of Data Reviewed Labs: ordered. Radiology: ordered.  Risk Prescription drug management.   BNP: 89.8 Troponin: 3  Considered for mission or further workup however patient's vital signs, physical exam, labs, and imaging of been reassuring.  Patient likely having a mild asthma exacerbation.  Patient given prednisone taper outpatient.  Patient given return precautions.  I feel patient safe for discharge at this time.         Francis Ileana SAILOR, PA-C 11/12/23 1701    Ula Prentice SAUNDERS, MD 11/12/23 2224

## 2023-11-12 NOTE — ED Provider Notes (Signed)
 [place] EMERGENCY DEPARTMENT AT [facility] Provider Note   CSN: [id number] Arrival date & time: 11/12/23  1232     Patient presents with: Shortness of Breath   Krystal Vargas is a 68 y.o. female.  Patient with past history significant for type 2 diabetes, endometrial cancer, CVA presents the emergency department concern of shortness of breath.  Reports this been ongoing for the last 1 to 2 days and not significantly alleviated by home albuterol .  Not current on anticoagulation but states that she takes 324 mg of aspirin  daily.  Denies any recent hemoptysis, URI symptoms, leg swelling, or chest pain.  Denies any productive cough with current feelings of shortness of breath.   Shortness of Breath      Prior to Admission medications   Medication Sig Start Date End Date Taking? Authorizing Provider  acetaminophen  (TYLENOL  8 HOUR) 650 MG CR tablet Take 1 tablet (650 mg total) by mouth every 8 (eight) hours as needed for pain. 08/27/22   Aberman, Caroline C, PA-C  albuterol  (PROVENTIL ) (2.5 MG/3ML) 0.083% nebulizer solution Take 2.5 mg by nebulization every 4 (four) hours as needed for wheezing or shortness of breath. 05/08/17   [provider]  albuterol  (VENTOLIN  HFA) 108 (90 Base) MCG/ACT inhaler Inhale 2 puffs into the lungs every 6 (six) hours as needed for wheezing or shortness of breath. 10/01/21   [provider]  aspirin  EC 325 MG tablet Take 1 tablet (325 mg total) by mouth daily. 02/16/22   Rai, Nydia POUR, MD  atorvastatin  (LIPITOR ) 80 MG tablet Take 1 tablet (80 mg total) by mouth every evening. 02/16/22   Rai, Ripudeep K, MD  fluticasone-salmeterol (WIXELA INHUB) 250-50 MCG/ACT AEPB Inhale 1 puff into the lungs as needed (for wheezing and shortness of breath). 08/06/21   [provider]  glucose blood (ONETOUCH VERIO) test strip USE AS DIRECTED TO CHECK  BLOOD SUGAR TWICE DAILY 06/29/21   [provider]  isosorbide  mononitrate (IMDUR ) 30 MG  24 hr tablet Take 30 mg by mouth in the morning and at bedtime. 05/11/19   [provider]  losartan  (COZAAR ) 100 MG tablet Take 100 mg by mouth daily.    [provider]  meclizine  (ANTIVERT ) 25 MG tablet Take 1 tablet (25 mg total) by mouth 3 (three) times daily as needed for dizziness. 04/23/23   Gonfa, Taye T, MD  metFORMIN  (GLUCOPHAGE ) 500 MG tablet Take 500 mg by mouth 2 (two) times daily. 02/12/22   [provider]  Multiple Vitamins-Minerals (MULTIPLE VITAMINS/WOMENS) tablet Take 1 tablet by mouth daily.    [provider]  nitroGLYCERIN  (NITROSTAT ) 0.4 MG SL tablet Place 1 tablet (0.4 mg total) under the tongue every 5 (five) minutes as needed for chest pain. 06/10/18   Claudene Maximino LABOR, MD  Omega-3 Fatty Acids (FISH OIL) 1000 MG CAPS Take 1,000 mg by mouth daily.    [provider]  [facility] VERIO test strip 1 each 2 (two) times daily. 09/15/21   [provider]  Polyethyl Glycol-Propyl Glycol (SYSTANE OP) Place 1 drop into both eyes daily.    [provider]    Allergies: Amlodipine, Metoprolol , Prednisone, Doxycycline , and Hydralazine     Review of Systems  Respiratory:  Positive for shortness of breath.   All other systems reviewed and are negative.   Updated Vital Signs BP 138/83 (BP Location: Left Arm)   Pulse 61   Temp 98.9 F (37.2 C) (Oral)   Resp 18  LMP 05/16/2012   SpO2 100%   Physical Exam Vitals and nursing note reviewed.  Constitutional:      General: She is not in acute distress.    Appearance: She is well-developed.  HENT:     Head: Normocephalic and atraumatic.  Eyes:     Conjunctiva/sclera: Conjunctivae normal.  Cardiovascular:     Rate and Rhythm: Normal rate and regular rhythm.     Heart sounds: No murmur heard. Pulmonary:     Effort: Pulmonary effort is normal. No respiratory distress.     Breath sounds: Normal breath sounds. No decreased breath sounds, wheezing, rhonchi or rales.      Comments: Audible wheezing without auscultation, but lung exam is unremarkable. Abdominal:     Palpations: Abdomen is soft.     Tenderness: There is no abdominal tenderness.  Musculoskeletal:        General: No swelling.     Cervical back: Neck supple.  Skin:    General: Skin is warm and dry.     Capillary Refill: Capillary refill takes less than 2 seconds.  Neurological:     Mental Status: She is alert.  Psychiatric:        Mood and Affect: Mood normal.     (all labs ordered are listed, but only abnormal results are displayed) Labs Reviewed  BASIC METABOLIC PANEL WITH GFR - Abnormal; Notable for the following components:      Result Value   BUN 25 (*)    Creatinine, Ser 1.10 (*)    GFR, Estimated 54 (*)    All other components within normal limits  CBC - Abnormal; Notable for the following components:   RBC 3.58 (*)    Hemoglobin 11.1 (*)    HCT 34.9 (*)    All other components within normal limits  BRAIN NATRIURETIC PEPTIDE  TROPONIN I (HIGH SENSITIVITY)    EKG: None  Radiology: DG Chest 2 View Result Date: 11/12/2023 CLINICAL DATA:  SOB/Hx Asthma. EXAM: CHEST - 2 VIEW COMPARISON:  03/23/2022. FINDINGS: Low lung volume. Bilateral lung fields are clear. Bilateral costophrenic angles are clear. Normal cardio-mediastinal silhouette. No acute osseous abnormalities. The soft tissues are within normal limits. IMPRESSION: No active cardiopulmonary disease. Electronically Signed   By: Ree Molt M.D.   On: 11/12/2023 14:48     Procedures   Medications Ordered in the ED  ipratropium-albuterol  (DUONEB) 0.5-2.5 (3) MG/3ML nebulizer solution 3 mL (3 mLs Nebulization Given 11/12/23 1455)    Clinical Course as of 11/12/23 1517  Wed Nov 12, 2023  1511 Hemoglobin(!): 11.1 [OZ]    Clinical Course User Index [OZ] Cecily Legrand LABOR, PA-C                                 Medical Decision Making Amount and/or Complexity of Data Reviewed Labs: ordered. Decision-making details  documented in ED Course. Radiology: ordered.  Risk Prescription drug management.   This patient presents to the ED for concern of shortness of breath.  Differential diagnosis includes ACS, PE, pneumonia, bronchitis, asthma exacerbation, COPD    Lab Tests:  I Ordered, and personally interpreted labs.  The pertinent results include: CBC with hemoglobin 11.1 consistent with patient's baseline anemia, BMP shows creatinine at 1.1 slightly worse than baseline.  Troponin pending, BNP pending   Imaging Studies ordered:  I ordered imaging studies including chest x-ray I independently visualized and interpreted imaging which showed negative for any acute  cardiopulmonary process I agree with the radiologist interpretation   Medicines ordered and prescription drug management:  I ordered medication including DuoNeb for wheezing, shortness of breath Reevaluation of the patient after these medicines showed that the patient pending reevaluation I have reviewed the patients home medicines and have made adjustments as needed   Problem List / ED Course:  Patient presents the emergency department with concerns of shortness of breath.  Reports has been ongoing for the last day and a half.  Present at rest and with activity.  History of type 2 diabetes, endometrial cancer, CVA.  She is current on aspirin  324 mg daily.  Denies blood thinners.  No reported productive cough, hemoptysis, leg swelling, or chest pain. On exam, patient is well-appearing with subjective shortness of breath as initial vitals are unremarkable with no signs of tachypnea and O2 remaining normal at greater than 98%.  No appreciable tachycardia and patient is normotensive with normal temperature 98.9 F. Based on patient's history and current reported symptoms, will add on troponin and BMP for evaluation of possible cardiac source of symptoms.  DuoNeb ordered for management of patient's shortness of breath.  Will reassess  shortly. CBC and BMP unremarkable.  Chest x-ray negative for any acute cardiopulmonary process.  EKG shows sinus rhythm.  3:17 PM Care of Lexie Koehl transferred to [facility] and Dr. Ula at the end of my shift as the patient will require reassessment once labs/imaging have resulted. Patient presentation, ED course, and plan of care discussed with review of all pertinent labs and imaging. Please see his/her note for further details regarding further ED course and disposition. Plan at time of handoff is disposition per results of troponin, BNP, and reevaluate of patient condition after DuoNeb. If improved, consider discharging home with steroids plus or minus antibiotics for asthma exacerbation. This may be altered or completely changed at the discretion of the oncoming team pending results of further workup.   Final diagnoses:  None    ED Discharge Orders     None          Cecily Legrand LABOR, PA-C 11/12/23 1517    Freddi Hamilton, MD 11/13/23 [phone]

## 2023-11-12 NOTE — ED Notes (Signed)
 Pt. Took husband to bathroom in wheelchair.

## 2023-11-12 NOTE — Discharge Instructions (Addendum)
 Today you were seen for an asthma exacerbation.  Please pick up your prednisone taper and take as prescribed.  This prednisone taper may increase your blood sugar, please be sure to check your blood sugar often and take your diabetes medication as prescribed.  Thank you for letting us  treat you today. After reviewing your labs and imaging, I feel you are safe to go home. Please follow up with your PCP in the next several days and provide them with your records from this visit. Return to the Emergency Room if pain becomes severe or symptoms worsen.

## 2023-11-12 NOTE — ED Triage Notes (Signed)
 Pt arriving with shortness of breath since yesterday. Pt reports hx of asthma, used inhaler yesterday with little relief. Pt states she is wheezing. Pt reports stroke in September 2023. Denies any stroke symptoms or chest pain.

## 2023-11-16 ENCOUNTER — Telehealth (HOSPITAL_COMMUNITY): Payer: Self-pay | Admitting: Emergency Medicine

## 2023-11-16 MED ORDER — PREDNISONE 10 MG (21) PO TBPK: ORAL_TABLET | Freq: Every day | ORAL | 0 refills | Status: AC

## 2024-01-01 ENCOUNTER — Other Ambulatory Visit (HOSPITAL_COMMUNITY): Payer: Self-pay | Admitting: Cardiology

## 2024-01-01 DIAGNOSIS — R29898 Other symptoms and signs involving the musculoskeletal system: Secondary | ICD-10-CM

## 2024-01-05 ENCOUNTER — Ambulatory Visit (HOSPITAL_COMMUNITY)

## 2024-01-08 ENCOUNTER — Ambulatory Visit (HOSPITAL_COMMUNITY)
Admission: RE | Admit: 2024-01-08 | Discharge: 2024-01-08 | Disposition: A | Discharge status: Home / Self Care | Source: Ambulatory Visit | Attending: Cardiology | Admitting: Cardiology

## 2024-01-08 DIAGNOSIS — R29898 Other symptoms and signs involving the musculoskeletal system: Secondary | ICD-10-CM | POA: Diagnosis present

## 2024-01-08 LAB — VAS US ABI WITH/WO TBI
Left ABI: 1.31
Right ABI: 1.21

## 2024-01-20 ENCOUNTER — Ambulatory Visit: Admitting: Podiatry

## 2024-02-04 ENCOUNTER — Other Ambulatory Visit: Payer: Self-pay | Admitting: Nurse Practitioner

## 2024-02-04 DIAGNOSIS — R109 Unspecified abdominal pain: Secondary | ICD-10-CM

## 2024-02-10 ENCOUNTER — Ambulatory Visit: Admitting: Podiatry

## 2024-02-17 ENCOUNTER — Ambulatory Visit (INDEPENDENT_AMBULATORY_CARE_PROVIDER_SITE_OTHER): Admitting: Podiatry

## 2024-02-17 DIAGNOSIS — M79672 Pain in left foot: Secondary | ICD-10-CM | POA: Diagnosis not present

## 2024-02-17 DIAGNOSIS — M79671 Pain in right foot: Secondary | ICD-10-CM | POA: Diagnosis not present

## 2024-02-17 DIAGNOSIS — B351 Tinea unguium: Secondary | ICD-10-CM

## 2024-02-17 NOTE — Progress Notes (Signed)
 Patient presents for evaluation and treatment of tenderness and some redness around nails feet.  Tenderness around toes with walking and wearing shoes.  Physical exam:  General appearance: Alert, pleasant, and in no acute distress.  Vascular: Pedal pulses: DP 2/4 B/L, PT 0/4 B/L. Mild edema lower legs bilaterally  Neu  Dermatologic:  Nails thickened, disfigured, discolored 1-5 BL with subungual debris.  Redness and hypertrophic nail folds along nail folds bilaterally but no signs of drainage or infection.  Musculoskeletal:     Diagnosis: 1. Painful onychomycotic nails 1 through 5 bilaterally. 2. Pain toes 1 through 5 bilaterally.  Plan: -Debrided onychomycotic nails 1 through 5 bilaterally.  Sharply debrided nails with nail clipper and reduced with a power bur.  Return 3 months [facility]

## 2024-03-06 ENCOUNTER — Other Ambulatory Visit: Payer: Self-pay

## 2024-03-06 ENCOUNTER — Encounter (HOSPITAL_COMMUNITY): Payer: Self-pay

## 2024-03-06 ENCOUNTER — Emergency Department (HOSPITAL_COMMUNITY)
Admission: EM | Admit: 2024-03-06 | Discharge: 2024-03-07 | Disposition: A | Discharge status: Home / Self Care | Attending: Emergency Medicine | Admitting: Emergency Medicine

## 2024-03-06 DIAGNOSIS — D649 Anemia, unspecified: Secondary | ICD-10-CM | POA: Insufficient documentation

## 2024-03-06 DIAGNOSIS — Z79899 Other long term (current) drug therapy: Secondary | ICD-10-CM | POA: Insufficient documentation

## 2024-03-06 DIAGNOSIS — E119 Type 2 diabetes mellitus without complications: Secondary | ICD-10-CM | POA: Insufficient documentation

## 2024-03-06 DIAGNOSIS — I10 Essential (primary) hypertension: Secondary | ICD-10-CM | POA: Diagnosis not present

## 2024-03-06 DIAGNOSIS — Z7982 Long term (current) use of aspirin: Secondary | ICD-10-CM | POA: Diagnosis not present

## 2024-03-06 DIAGNOSIS — R519 Headache, unspecified: Secondary | ICD-10-CM | POA: Diagnosis present

## 2024-03-06 DIAGNOSIS — Z8673 Personal history of transient ischemic attack (TIA), and cerebral infarction without residual deficits: Secondary | ICD-10-CM | POA: Diagnosis not present

## 2024-03-06 DIAGNOSIS — Z7984 Long term (current) use of oral hypoglycemic drugs: Secondary | ICD-10-CM | POA: Insufficient documentation

## 2024-03-06 MED ORDER — SODIUM CHLORIDE 0.9 % IV BOLUS
1000.0000 mL | Freq: Once | INTRAVENOUS | Status: AC
  Administered 2024-03-07: 1000 mL via INTRAVENOUS

## 2024-03-06 MED ORDER — PROCHLORPERAZINE EDISYLATE 10 MG/2ML IJ SOLN
10.0000 mg | Freq: Once | INTRAMUSCULAR | Status: AC
Start: 2024-03-07 — End: 2024-03-07
  Administered 2024-03-07: 10 mg via INTRAVENOUS
  Filled 2024-03-06: qty 2

## 2024-03-06 NOTE — ED Provider Notes (Signed)
 [place] EMERGENCY DEPARTMENT AT [facility] Provider Note   CSN: [id number] Arrival date & time: 03/06/24  2004     Patient presents with: Headache   Krystal Vargas is a 68 y.o. female.  {Add pertinent medical, surgical, social history, OB history to YEP:67052} The history is provided by the patient.  Headache  She has history of hypertension, diabetes, hyperlipidemia, stroke and complains of pain in the right side of her face and forehead over the last 1-2 weeks.  Pain comes and goes without any particular pattern but is worse when she moves her head.  Pain does cross the midline of the forehead.  She \\denies  weakness, numbness, photophobia, phonophobia, visual change, nausea, vomiting.  She states that she has drank herbal tea which does seem to give her some relief but has not tried any other treatment.  She is unable to describe the pain.    Prior to Admission medications   Medication Sig Start Date End Date Taking? Authorizing Provider  acetaminophen  (TYLENOL  8 HOUR) 650 MG CR tablet Take 1 tablet (650 mg total) by mouth every 8 (eight) hours as needed for pain. 08/27/22   Aberman, Caroline C, PA-C  albuterol  (PROVENTIL ) (2.5 MG/3ML) 0.083% nebulizer solution Take 2.5 mg by nebulization every 4 (four) hours as needed for wheezing or shortness of breath. 05/08/17   [provider]  albuterol  (VENTOLIN  HFA) 108 (90 Base) MCG/ACT inhaler Inhale 2 puffs into the lungs every 6 (six) hours as needed for wheezing or shortness of breath. 10/01/21   [provider]  aspirin  EC 325 MG tablet Take 1 tablet (325 mg total) by mouth daily. 02/16/22   Rai, Nydia POUR, MD  atorvastatin  (LIPITOR ) 80 MG tablet Take 1 tablet (80 mg total) by mouth every evening. 02/16/22   Rai, Ripudeep K, MD  fluticasone-salmeterol (WIXELA INHUB) 250-50 MCG/ACT AEPB Inhale 1 puff into the lungs as needed (for wheezing and shortness of breath). 08/06/21   [provider]  glucose blood  (ONETOUCH VERIO) test strip USE AS DIRECTED TO CHECK  BLOOD SUGAR TWICE DAILY 06/29/21   [provider]  isosorbide  mononitrate (IMDUR ) 30 MG 24 hr tablet Take 30 mg by mouth in the morning and at bedtime. 05/11/19   [provider]  losartan  (COZAAR ) 100 MG tablet Take 100 mg by mouth daily.    [provider]  meclizine  (ANTIVERT ) 25 MG tablet Take 1 tablet (25 mg total) by mouth 3 (three) times daily as needed for dizziness. 04/23/23   Gonfa, Taye T, MD  metFORMIN  (GLUCOPHAGE ) 500 MG tablet Take 500 mg by mouth 2 (two) times daily. 02/12/22   [provider]  Multiple Vitamins-Minerals (MULTIPLE VITAMINS/WOMENS) tablet Take 1 tablet by mouth daily.    [provider]  nitroGLYCERIN  (NITROSTAT ) 0.4 MG SL tablet Place 1 tablet (0.4 mg total) under the tongue every 5 (five) minutes as needed for chest pain. 06/10/18   Claudene Maximino LABOR, MD  Omega-3 Fatty Acids (FISH OIL) 1000 MG CAPS Take 1,000 mg by mouth daily.    [provider]  [facility] VERIO test strip 1 each 2 (two) times daily. 09/15/21   [provider]  Polyethyl Glycol-Propyl Glycol (SYSTANE OP) Place 1 drop into both eyes daily.    [provider]  predniSONE  (STERAPRED UNI-PAK 21 TAB) 10 MG (21) TBPK tablet Take by mouth daily. Take 6 tabs by mouth daily  for 2 days, then 5 tabs for 2 days, then 4 tabs for  2 days, then 3 tabs for 2 days, 2 tabs for 2 days, then 1 tab by mouth daily for 2 days 11/16/23   Nivia Colon, PA-C    Allergies: Amlodipine, Metoprolol , Prednisone , Doxycycline , and Hydralazine     Review of Systems  Neurological:  Positive for headaches.  All other systems reviewed and are negative.   Updated Vital Signs BP (!) 154/90 (BP Location: Left Arm)   Pulse 60   Temp 98.7 F (37.1 C) (Oral)   Resp 18   LMP 05/16/2012   SpO2 100%   Physical Exam Vitals and nursing note reviewed.   68 year old female, resting comfortably and in no acute distress.  Vital signs are significant for elevated blood pressure. Oxygen saturation is 100%, which is normal. Head is normocephalic and atraumatic. PERRLA, EOMI. there is no tenderness over the temporal arteries or over the temporalis muscle.  There is some mild tenderness in the right submandibular area just inferior to the angle of the mandible but no masses palpable. Neck is nontender and supple. Lungs are clear without rales, wheezes, or rhonchi. Chest is nontender. Heart has regular rate and rhythm without murmur. Abdomen is soft, flat, nontender. Extremities have no cyanosis or edema. Skin is warm and dry without rash. Neurologic: Mental status is normal, cranial nerves are intact including sensation in all 3 divisions of the trigeminal nerve, strength is 5/5 in all 4 extremities, sensation is intact.  (all labs ordered are listed, but only abnormal results are displayed) Labs Reviewed - No data to display  EKG: None  Radiology: No results found.  {Document cardiac monitor, telemetry assessment procedure when appropriate:32947} Procedures   Medications Ordered in the ED - No data to display    {Click here for ABCD2, HEART and other calculators REFRESH Note before signing:1}                              Medical Decision Making  Right-sided facial pain.  This does not seem like a typical headache and seems atypical for polymyalgia rheumatica or temporal arteritis.  Consider trigeminal neuralgia.  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  CT scan also showed significant degenerative changes with some cervical spinal stenosis.  I have ordered CT of the head and screening labs including sedimentation rate and also a therapeutic trial of a migraine cocktail of normal saline and prochlorperazine.  {Document critical care time when appropriate   Document review of labs and clinical decision tools ie CHADS2VASC2, etc  Document your independent review of radiology images and any outside records  Document your discussion with family members, caretakers and with consultants  Document social determinants of health affecting pt's care  Document your decision making why or why not admission, treatments were needed:32947:::1}   Final diagnoses:  None    ED Discharge Orders     None

## 2024-03-06 NOTE — ED Triage Notes (Signed)
 PT states she has neck pain on the right side of her head that is causing a right sided head ache and tingling in her right ear x2 weeks

## 2024-03-07 ENCOUNTER — Emergency Department (HOSPITAL_COMMUNITY)

## 2024-03-07 LAB — CBC WITH DIFFERENTIAL/PLATELET
Abs Immature Granulocytes: 0 K/uL (ref 0.00–0.07)
Basophils Absolute: 0 K/uL (ref 0.0–0.1)
Basophils Relative: 0 %
Eosinophils Absolute: 0.1 K/uL (ref 0.0–0.5)
Eosinophils Relative: 2 %
HCT: 35.2 % — ABNORMAL LOW (ref 36.0–46.0)
Hemoglobin: 11.4 g/dL — ABNORMAL LOW (ref 12.0–15.0)
Immature Granulocytes: 0 %
Lymphocytes Relative: 40 %
Lymphs Abs: 2 K/uL (ref 0.7–4.0)
MCH: 30.7 pg (ref 26.0–34.0)
MCHC: 32.4 g/dL (ref 30.0–36.0)
MCV: 94.9 fL (ref 80.0–100.0)
Monocytes Absolute: 0.5 K/uL (ref 0.1–1.0)
Monocytes Relative: 9 %
Neutro Abs: 2.4 K/uL (ref 1.7–7.7)
Neutrophils Relative %: 49 %
Platelets: 248 K/uL (ref 150–400)
RBC: 3.71 MIL/uL — ABNORMAL LOW (ref 3.87–5.11)
RDW: 14.3 % (ref 11.5–15.5)
WBC: 5.1 K/uL (ref 4.0–10.5)
nRBC: 0 % (ref 0.0–0.2)

## 2024-03-07 LAB — BASIC METABOLIC PANEL WITH GFR
Anion gap: 12 (ref 5–15)
BUN: 17 mg/dL (ref 8–23)
CO2: 25 mmol/L (ref 22–32)
Calcium: 10.1 mg/dL (ref 8.9–10.3)
Chloride: 103 mmol/L (ref 98–111)
Creatinine, Ser: 1.21 mg/dL — ABNORMAL HIGH (ref 0.44–1.00)
GFR, Estimated: 48 mL/min — ABNORMAL LOW (ref >60)
Glucose, Bld: 155 mg/dL — ABNORMAL HIGH (ref 70–99)
Potassium: 3.7 mmol/L (ref 3.5–5.1)
Sodium: 140 mmol/L (ref 135–145)

## 2024-03-07 LAB — SEDIMENTATION RATE: Sed Rate: 10 mm/h (ref 0–22)

## 2024-03-07 MED ORDER — NAPROXEN 375 MG PO TABS: 375.0000 mg | ORAL_TABLET | Freq: Two times a day (BID) | ORAL | 0 refills | Status: AC

## 2024-03-07 MED ORDER — NAPROXEN 375 MG PO TABS: 375.0000 mg | ORAL_TABLET | Freq: Two times a day (BID) | ORAL | 0 refills | Status: DC

## 2024-03-07 NOTE — Discharge Instructions (Addendum)
 You may take acetaminophen  as needed to get additional pain relief.  You may apply ice or heat to your neck and to spot where the pain seems to be coming from.  Return to the emergency department if your symptoms are getting worse.

## 2024-05-19 ENCOUNTER — Ambulatory Visit: Admitting: Podiatry
